# Patient Record
Sex: Female | Born: 1945
Health system: Southern US, Community
[De-identification: ages and names within clinical notes are randomized; demographics above are authoritative.]

## PROBLEM LIST (undated history)

## (undated) DIAGNOSIS — R112 Nausea with vomiting, unspecified: Secondary | ICD-10-CM

## (undated) DIAGNOSIS — Z860100 Personal history of colon polyps, unspecified: Secondary | ICD-10-CM

## (undated) DIAGNOSIS — N200 Calculus of kidney: Secondary | ICD-10-CM

## (undated) DIAGNOSIS — E559 Vitamin D deficiency, unspecified: Secondary | ICD-10-CM

## (undated) DIAGNOSIS — Z973 Presence of spectacles and contact lenses: Secondary | ICD-10-CM

## (undated) DIAGNOSIS — Z87442 Personal history of urinary calculi: Secondary | ICD-10-CM

## (undated) DIAGNOSIS — Z9889 Other specified postprocedural states: Secondary | ICD-10-CM

## (undated) DIAGNOSIS — K5730 Diverticulosis of large intestine without perforation or abscess without bleeding: Secondary | ICD-10-CM

## (undated) DIAGNOSIS — E785 Hyperlipidemia, unspecified: Secondary | ICD-10-CM

## (undated) DIAGNOSIS — N201 Calculus of ureter: Secondary | ICD-10-CM

## (undated) DIAGNOSIS — I499 Cardiac arrhythmia, unspecified: Secondary | ICD-10-CM

## (undated) DIAGNOSIS — R600 Localized edema: Secondary | ICD-10-CM

## (undated) DIAGNOSIS — I471 Supraventricular tachycardia, unspecified: Secondary | ICD-10-CM

## (undated) DIAGNOSIS — M479 Spondylosis, unspecified: Secondary | ICD-10-CM

## (undated) DIAGNOSIS — R3915 Urgency of urination: Secondary | ICD-10-CM

## (undated) DIAGNOSIS — I2699 Other pulmonary embolism without acute cor pulmonale: Secondary | ICD-10-CM

## (undated) DIAGNOSIS — I8390 Asymptomatic varicose veins of unspecified lower extremity: Secondary | ICD-10-CM

## (undated) DIAGNOSIS — I639 Cerebral infarction, unspecified: Secondary | ICD-10-CM

## (undated) DIAGNOSIS — M8580 Other specified disorders of bone density and structure, unspecified site: Secondary | ICD-10-CM

## (undated) DIAGNOSIS — M545 Low back pain, unspecified: Secondary | ICD-10-CM

## (undated) DIAGNOSIS — I878 Other specified disorders of veins: Secondary | ICD-10-CM

## (undated) DIAGNOSIS — T7840XA Allergy, unspecified, initial encounter: Secondary | ICD-10-CM

## (undated) DIAGNOSIS — M419 Scoliosis, unspecified: Secondary | ICD-10-CM

## (undated) DIAGNOSIS — R351 Nocturia: Secondary | ICD-10-CM

## (undated) DIAGNOSIS — Z85828 Personal history of other malignant neoplasm of skin: Secondary | ICD-10-CM

## (undated) DIAGNOSIS — C801 Malignant (primary) neoplasm, unspecified: Secondary | ICD-10-CM

## (undated) HISTORY — DX: Scoliosis, unspecified: M41.9

## (undated) HISTORY — DX: Other specified disorders of veins: I87.8

## (undated) HISTORY — PX: TUBAL LIGATION: SHX77

## (undated) HISTORY — DX: Calculus of kidney: N20.0

## (undated) HISTORY — DX: Nocturia: R35.1

## (undated) HISTORY — DX: Hyperlipidemia, unspecified: E78.5

## (undated) HISTORY — DX: Vitamin D deficiency, unspecified: E55.9

## (undated) HISTORY — PX: WISDOM TOOTH EXTRACTION: SHX21

## (undated) HISTORY — DX: Allergy, unspecified, initial encounter: T78.40XA

## (undated) HISTORY — DX: Asymptomatic varicose veins of unspecified lower extremity: I83.90

## (undated) HISTORY — DX: Cerebral infarction, unspecified: I63.9

## (undated) HISTORY — DX: Diverticulosis of large intestine without perforation or abscess without bleeding: K57.30

## (undated) HISTORY — DX: Other pulmonary embolism without acute cor pulmonale: I26.99

## (undated) HISTORY — DX: Malignant (primary) neoplasm, unspecified: C80.1

## (undated) HISTORY — PX: EXTRACORPOREAL SHOCK WAVE LITHOTRIPSY: SHX1557

## (undated) HISTORY — DX: Low back pain, unspecified: M54.50

## (undated) HISTORY — DX: Localized edema: R60.0

## (undated) HISTORY — DX: Other specified disorders of bone density and structure, unspecified site: M85.80

---
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
## 1966-08-25 HISTORY — PX: OTHER SURGICAL HISTORY: SHX169

## 1999-04-22 ENCOUNTER — Other Ambulatory Visit: Admission: RE | Admit: 1999-04-22 | Discharge: 1999-04-22 | Payer: Self-pay | Admitting: Obstetrics and Gynecology

## 1999-11-14 ENCOUNTER — Encounter: Admission: RE | Admit: 1999-11-14 | Discharge: 1999-11-14 | Payer: Self-pay | Admitting: Obstetrics and Gynecology

## 1999-11-14 ENCOUNTER — Encounter: Payer: Self-pay | Admitting: Obstetrics and Gynecology

## 2000-07-02 ENCOUNTER — Other Ambulatory Visit: Admission: RE | Admit: 2000-07-02 | Discharge: 2000-07-02 | Payer: Self-pay | Admitting: Obstetrics and Gynecology

## 2000-08-25 HISTORY — PX: COLONOSCOPY W/ POLYPECTOMY: SHX1380

## 2000-10-26 ENCOUNTER — Encounter (INDEPENDENT_AMBULATORY_CARE_PROVIDER_SITE_OTHER): Payer: Self-pay

## 2000-10-26 ENCOUNTER — Ambulatory Visit (HOSPITAL_COMMUNITY): Admission: RE | Admit: 2000-10-26 | Discharge: 2000-10-26 | Payer: Self-pay | Admitting: Internal Medicine

## 2000-11-24 ENCOUNTER — Encounter: Payer: Self-pay | Admitting: Obstetrics and Gynecology

## 2000-11-24 ENCOUNTER — Encounter: Admission: RE | Admit: 2000-11-24 | Discharge: 2000-11-24 | Payer: Self-pay | Admitting: Obstetrics and Gynecology

## 2000-12-22 ENCOUNTER — Encounter (INDEPENDENT_AMBULATORY_CARE_PROVIDER_SITE_OTHER): Payer: Self-pay | Admitting: Specialist

## 2000-12-22 ENCOUNTER — Ambulatory Visit (HOSPITAL_COMMUNITY): Admission: RE | Admit: 2000-12-22 | Discharge: 2000-12-22 | Payer: Self-pay | Admitting: Obstetrics and Gynecology

## 2000-12-22 HISTORY — PX: HYSTEROSCOPY WITH D & C: SHX1775

## 2001-07-29 ENCOUNTER — Ambulatory Visit (HOSPITAL_COMMUNITY): Admission: RE | Admit: 2001-07-29 | Discharge: 2001-07-29 | Payer: Self-pay | Admitting: Urology

## 2001-07-29 ENCOUNTER — Encounter: Payer: Self-pay | Admitting: Urology

## 2001-08-12 ENCOUNTER — Ambulatory Visit (HOSPITAL_BASED_OUTPATIENT_CLINIC_OR_DEPARTMENT_OTHER): Admission: RE | Admit: 2001-08-12 | Discharge: 2001-08-12 | Payer: Self-pay | Admitting: Urology

## 2001-08-12 ENCOUNTER — Encounter: Payer: Self-pay | Admitting: Urology

## 2001-08-13 ENCOUNTER — Other Ambulatory Visit: Admission: RE | Admit: 2001-08-13 | Discharge: 2001-08-13 | Payer: Self-pay | Admitting: Obstetrics and Gynecology

## 2001-12-14 ENCOUNTER — Encounter: Payer: Self-pay | Admitting: Obstetrics and Gynecology

## 2001-12-14 ENCOUNTER — Encounter: Admission: RE | Admit: 2001-12-14 | Discharge: 2001-12-14 | Payer: Self-pay | Admitting: Obstetrics and Gynecology

## 2002-07-07 ENCOUNTER — Other Ambulatory Visit: Admission: RE | Admit: 2002-07-07 | Discharge: 2002-07-07 | Payer: Self-pay | Admitting: Obstetrics and Gynecology

## 2002-12-20 ENCOUNTER — Encounter: Payer: Self-pay | Admitting: Obstetrics and Gynecology

## 2002-12-20 ENCOUNTER — Encounter: Admission: RE | Admit: 2002-12-20 | Discharge: 2002-12-20 | Payer: Self-pay | Admitting: Obstetrics and Gynecology

## 2003-07-14 ENCOUNTER — Other Ambulatory Visit: Admission: RE | Admit: 2003-07-14 | Discharge: 2003-07-14 | Payer: Self-pay | Admitting: Obstetrics and Gynecology

## 2003-08-26 HISTORY — PX: FACELIFT W/BLEPHAROPLASTY: SHX1568

## 2004-02-20 ENCOUNTER — Encounter: Admission: RE | Admit: 2004-02-20 | Discharge: 2004-02-20 | Payer: Self-pay | Admitting: Obstetrics and Gynecology

## 2004-07-26 ENCOUNTER — Other Ambulatory Visit: Admission: RE | Admit: 2004-07-26 | Discharge: 2004-07-26 | Payer: Self-pay | Admitting: Obstetrics and Gynecology

## 2004-08-21 ENCOUNTER — Ambulatory Visit: Payer: Self-pay | Admitting: Internal Medicine

## 2004-08-22 ENCOUNTER — Ambulatory Visit: Payer: Self-pay | Admitting: Internal Medicine

## 2005-05-12 ENCOUNTER — Encounter: Admission: RE | Admit: 2005-05-12 | Discharge: 2005-05-12 | Payer: Self-pay | Admitting: Obstetrics and Gynecology

## 2005-07-31 ENCOUNTER — Other Ambulatory Visit: Admission: RE | Admit: 2005-07-31 | Discharge: 2005-07-31 | Payer: Self-pay | Admitting: Obstetrics and Gynecology

## 2005-08-25 DIAGNOSIS — K5730 Diverticulosis of large intestine without perforation or abscess without bleeding: Secondary | ICD-10-CM

## 2005-08-25 HISTORY — DX: Diverticulosis of large intestine without perforation or abscess without bleeding: K57.30

## 2005-09-30 ENCOUNTER — Ambulatory Visit: Payer: Self-pay | Admitting: Internal Medicine

## 2005-12-15 ENCOUNTER — Ambulatory Visit: Payer: Self-pay | Admitting: Internal Medicine

## 2005-12-25 ENCOUNTER — Ambulatory Visit: Payer: Self-pay | Admitting: Internal Medicine

## 2006-05-30 ENCOUNTER — Inpatient Hospital Stay (HOSPITAL_COMMUNITY): Admission: RE | Admit: 2006-05-30 | Discharge: 2006-06-02 | Payer: Self-pay | Admitting: Orthopedic Surgery

## 2006-05-30 HISTORY — PX: TOTAL KNEE ARTHROPLASTY: SHX125

## 2006-07-13 ENCOUNTER — Ambulatory Visit (HOSPITAL_COMMUNITY): Admission: RE | Admit: 2006-07-13 | Discharge: 2006-07-13 | Payer: Self-pay | Admitting: Orthopedic Surgery

## 2006-07-13 HISTORY — PX: OTHER SURGICAL HISTORY: SHX169

## 2006-09-29 ENCOUNTER — Ambulatory Visit: Payer: Self-pay | Admitting: Internal Medicine

## 2006-09-29 LAB — CONVERTED CEMR LAB
ALT: 18 units/L (ref 0–40)
AST: 23 units/L (ref 0–37)
Albumin: 4 g/dL (ref 3.5–5.2)
Alkaline Phosphatase: 61 units/L (ref 39–117)
BUN: 15 mg/dL (ref 6–23)
Basophils Absolute: 0 10*3/uL (ref 0.0–0.1)
Basophils Relative: 0.5 % (ref 0.0–1.0)
Bilirubin, Direct: 0.1 mg/dL (ref 0.0–0.3)
CO2: 28 meq/L (ref 19–32)
Calcium: 9.3 mg/dL (ref 8.4–10.5)
Chloride: 107 meq/L (ref 96–112)
Cholesterol: 219 mg/dL (ref 0–200)
Creatinine, Ser: 0.8 mg/dL (ref 0.4–1.2)
Direct LDL: 136.8 mg/dL
Eosinophils Absolute: 0.1 10*3/uL (ref 0.0–0.6)
Eosinophils Relative: 1.9 % (ref 0.0–5.0)
GFR calc Af Amer: 93 mL/min
GFR calc non Af Amer: 77 mL/min
Glucose, Bld: 90 mg/dL (ref 70–99)
HCT: 39.7 % (ref 36.0–46.0)
HDL: 60.6 mg/dL (ref >39.0)
Hemoglobin: 14.2 g/dL (ref 12.0–15.0)
Lymphocytes Relative: 34.5 % (ref 12.0–46.0)
MCHC: 35.8 g/dL (ref 30.0–36.0)
MCV: 90.2 fL (ref 78.0–100.0)
Monocytes Absolute: 0.5 10*3/uL (ref 0.2–0.7)
Monocytes Relative: 8 % (ref 3.0–11.0)
Neutro Abs: 3.7 10*3/uL (ref 1.4–7.7)
Neutrophils Relative %: 55.1 % (ref 43.0–77.0)
Platelets: 242 10*3/uL (ref 150–400)
Potassium: 4 meq/L (ref 3.5–5.1)
RBC: 4.4 M/uL (ref 3.87–5.11)
RDW: 13 % (ref 11.5–14.6)
Sodium: 141 meq/L (ref 135–145)
TSH: 1.42 microintl units/mL (ref 0.35–5.50)
Total Bilirubin: 0.6 mg/dL (ref 0.3–1.2)
Total CHOL/HDL Ratio: 3.6
Total Protein: 7 g/dL (ref 6.0–8.3)
Triglycerides: 110 mg/dL (ref 0–149)
VLDL: 22 mg/dL (ref 0–40)
WBC: 6.6 10*3/uL (ref 4.5–10.5)

## 2006-11-27 ENCOUNTER — Other Ambulatory Visit: Admission: RE | Admit: 2006-11-27 | Discharge: 2006-11-27 | Payer: Self-pay | Admitting: Obstetrics and Gynecology

## 2007-01-14 ENCOUNTER — Ambulatory Visit: Payer: Self-pay | Admitting: Internal Medicine

## 2007-01-14 LAB — CONVERTED CEMR LAB
Cholesterol: 199 mg/dL (ref 0–200)
HDL: 60.5 mg/dL (ref >39.0)
LDL Cholesterol: 121 mg/dL — ABNORMAL HIGH (ref 0–99)
Total CHOL/HDL Ratio: 3.3
Triglycerides: 90 mg/dL (ref 0–149)
VLDL: 18 mg/dL (ref 0–40)

## 2007-01-19 DIAGNOSIS — Z860100 Personal history of colon polyps, unspecified: Secondary | ICD-10-CM | POA: Insufficient documentation

## 2007-01-19 DIAGNOSIS — T7840XA Allergy, unspecified, initial encounter: Secondary | ICD-10-CM | POA: Insufficient documentation

## 2007-01-19 DIAGNOSIS — Z85828 Personal history of other malignant neoplasm of skin: Secondary | ICD-10-CM | POA: Insufficient documentation

## 2007-01-19 DIAGNOSIS — IMO0002 Reserved for concepts with insufficient information to code with codable children: Secondary | ICD-10-CM

## 2007-06-23 ENCOUNTER — Encounter: Payer: Self-pay | Admitting: Internal Medicine

## 2007-06-23 ENCOUNTER — Encounter: Admission: RE | Admit: 2007-06-23 | Discharge: 2007-06-23 | Payer: Self-pay | Admitting: Obstetrics and Gynecology

## 2007-09-08 ENCOUNTER — Ambulatory Visit: Payer: Self-pay | Admitting: Internal Medicine

## 2007-09-08 DIAGNOSIS — M6530 Trigger finger, unspecified finger: Secondary | ICD-10-CM | POA: Insufficient documentation

## 2007-09-08 DIAGNOSIS — N390 Urinary tract infection, site not specified: Secondary | ICD-10-CM

## 2007-09-08 LAB — CONVERTED CEMR LAB
Bilirubin Urine: NEGATIVE
Glucose, Urine, Semiquant: NEGATIVE
Ketones, urine, test strip: NEGATIVE
Nitrite: NEGATIVE
Protein, U semiquant: NEGATIVE
Specific Gravity, Urine: 1.015
Urobilinogen, UA: NEGATIVE
pH: 6.5

## 2007-09-14 ENCOUNTER — Encounter (INDEPENDENT_AMBULATORY_CARE_PROVIDER_SITE_OTHER): Payer: Self-pay | Admitting: *Deleted

## 2007-11-24 ENCOUNTER — Ambulatory Visit: Payer: Self-pay | Admitting: Internal Medicine

## 2007-11-24 DIAGNOSIS — J309 Allergic rhinitis, unspecified: Secondary | ICD-10-CM | POA: Insufficient documentation

## 2007-11-24 DIAGNOSIS — C4490 Unspecified malignant neoplasm of skin, unspecified: Secondary | ICD-10-CM

## 2007-11-24 DIAGNOSIS — R002 Palpitations: Secondary | ICD-10-CM

## 2007-11-24 DIAGNOSIS — E785 Hyperlipidemia, unspecified: Secondary | ICD-10-CM

## 2007-11-24 HISTORY — DX: Palpitations: R00.2

## 2007-11-26 ENCOUNTER — Ambulatory Visit: Payer: Self-pay | Admitting: Internal Medicine

## 2007-12-01 ENCOUNTER — Encounter: Payer: Self-pay | Admitting: Internal Medicine

## 2007-12-05 LAB — CONVERTED CEMR LAB
ALT: 23 units/L (ref 0–35)
AST: 25 units/L (ref 0–37)
Albumin: 3.9 g/dL (ref 3.5–5.2)
Alkaline Phosphatase: 63 units/L (ref 39–117)
BUN: 20 mg/dL (ref 6–23)
Basophils Absolute: 0.1 10*3/uL (ref 0.0–0.1)
Basophils Relative: 1 % (ref 0.0–1.0)
Bilirubin, Direct: 0.1 mg/dL (ref 0.0–0.3)
CO2: 30 meq/L (ref 19–32)
Calcium: 9.4 mg/dL (ref 8.4–10.5)
Chloride: 111 meq/L (ref 96–112)
Cholesterol: 221 mg/dL (ref 0–200)
Creatinine, Ser: 0.8 mg/dL (ref 0.4–1.2)
Direct LDL: 159.5 mg/dL
Eosinophils Absolute: 0.2 10*3/uL (ref 0.0–0.7)
Eosinophils Relative: 3.8 % (ref 0.0–5.0)
GFR calc Af Amer: 93 mL/min
GFR calc non Af Amer: 77 mL/min
Glucose, Bld: 98 mg/dL (ref 70–99)
HCT: 43.7 % (ref 36.0–46.0)
HDL: 57.7 mg/dL (ref >39.0)
Hemoglobin: 14.2 g/dL (ref 12.0–15.0)
Lymphocytes Relative: 33.7 % (ref 12.0–46.0)
MCHC: 32.5 g/dL (ref 30.0–36.0)
MCV: 96.3 fL (ref 78.0–100.0)
Monocytes Absolute: 0.7 10*3/uL (ref 0.1–1.0)
Monocytes Relative: 10.5 % (ref 3.0–12.0)
Neutro Abs: 3.1 10*3/uL (ref 1.4–7.7)
Neutrophils Relative %: 51 % (ref 43.0–77.0)
Platelets: 219 10*3/uL (ref 150–400)
Potassium: 4.6 meq/L (ref 3.5–5.1)
RBC: 4.54 M/uL (ref 3.87–5.11)
RDW: 12.6 % (ref 11.5–14.6)
Sodium: 144 meq/L (ref 135–145)
TSH: 1.75 microintl units/mL (ref 0.35–5.50)
Total Bilirubin: 1 mg/dL (ref 0.3–1.2)
Total CHOL/HDL Ratio: 3.8
Total Protein: 6.5 g/dL (ref 6.0–8.3)
Triglycerides: 87 mg/dL (ref 0–149)
VLDL: 17 mg/dL (ref 0–40)
WBC: 6.2 10*3/uL (ref 4.5–10.5)

## 2007-12-06 ENCOUNTER — Encounter (INDEPENDENT_AMBULATORY_CARE_PROVIDER_SITE_OTHER): Payer: Self-pay | Admitting: *Deleted

## 2007-12-07 ENCOUNTER — Telehealth: Payer: Self-pay | Admitting: Internal Medicine

## 2007-12-15 ENCOUNTER — Encounter: Payer: Self-pay | Admitting: Internal Medicine

## 2007-12-15 ENCOUNTER — Other Ambulatory Visit: Admission: RE | Admit: 2007-12-15 | Discharge: 2007-12-15 | Payer: Self-pay | Admitting: Obstetrics and Gynecology

## 2007-12-17 ENCOUNTER — Encounter: Admission: RE | Admit: 2007-12-17 | Discharge: 2007-12-17 | Payer: Self-pay | Admitting: Obstetrics and Gynecology

## 2008-02-11 ENCOUNTER — Ambulatory Visit: Payer: Self-pay | Admitting: Internal Medicine

## 2008-02-20 LAB — CONVERTED CEMR LAB
ALT: 21 units/L (ref 0–35)
AST: 25 units/L (ref 0–37)
Cholesterol: 180 mg/dL (ref 0–200)
HDL: 53.1 mg/dL (ref >39.0)
LDL Cholesterol: 110 mg/dL — ABNORMAL HIGH (ref 0–99)
Total CHOL/HDL Ratio: 3.4
Triglycerides: 86 mg/dL (ref 0–149)
VLDL: 17 mg/dL (ref 0–40)

## 2008-02-23 ENCOUNTER — Encounter (INDEPENDENT_AMBULATORY_CARE_PROVIDER_SITE_OTHER): Payer: Self-pay | Admitting: *Deleted

## 2008-03-06 ENCOUNTER — Telehealth (INDEPENDENT_AMBULATORY_CARE_PROVIDER_SITE_OTHER): Payer: Self-pay | Admitting: *Deleted

## 2008-06-21 ENCOUNTER — Ambulatory Visit: Payer: Self-pay | Admitting: Internal Medicine

## 2008-07-03 ENCOUNTER — Encounter: Admission: RE | Admit: 2008-07-03 | Discharge: 2008-07-03 | Payer: Self-pay | Admitting: Internal Medicine

## 2008-08-21 ENCOUNTER — Ambulatory Visit (HOSPITAL_COMMUNITY): Admission: RE | Admit: 2008-08-21 | Discharge: 2008-08-21 | Payer: Self-pay | Admitting: Urology

## 2008-08-21 HISTORY — PX: OTHER SURGICAL HISTORY: SHX169

## 2008-08-23 ENCOUNTER — Telehealth (INDEPENDENT_AMBULATORY_CARE_PROVIDER_SITE_OTHER): Payer: Self-pay | Admitting: *Deleted

## 2008-08-23 ENCOUNTER — Ambulatory Visit: Payer: Self-pay | Admitting: Internal Medicine

## 2008-08-23 LAB — CONVERTED CEMR LAB
Cholesterol: 150 mg/dL (ref 0–200)
HDL: 60.2 mg/dL (ref >39.0)
LDL Cholesterol: 65 mg/dL (ref 0–99)
Total CHOL/HDL Ratio: 2.5
Triglycerides: 123 mg/dL (ref 0–149)
VLDL: 25 mg/dL (ref 0–40)

## 2008-08-24 ENCOUNTER — Encounter (INDEPENDENT_AMBULATORY_CARE_PROVIDER_SITE_OTHER): Payer: Self-pay | Admitting: *Deleted

## 2008-11-28 ENCOUNTER — Ambulatory Visit: Payer: Self-pay | Admitting: Internal Medicine

## 2008-11-28 DIAGNOSIS — Z87442 Personal history of urinary calculi: Secondary | ICD-10-CM | POA: Insufficient documentation

## 2008-11-28 DIAGNOSIS — E559 Vitamin D deficiency, unspecified: Secondary | ICD-10-CM

## 2008-11-28 LAB — CONVERTED CEMR LAB
ALT: 18 units/L (ref 0–35)
AST: 22 units/L (ref 0–37)
Albumin: 4.2 g/dL (ref 3.5–5.2)
Alkaline Phosphatase: 62 units/L (ref 39–117)
BUN: 18 mg/dL (ref 6–23)
Basophils Absolute: 0 10*3/uL (ref 0.0–0.1)
Basophils Relative: 0.2 % (ref 0.0–3.0)
Bilirubin, Direct: 0.1 mg/dL (ref 0.0–0.3)
CO2: 27 meq/L (ref 19–32)
Calcium: 9.4 mg/dL (ref 8.4–10.5)
Chloride: 109 meq/L (ref 96–112)
Cholesterol: 159 mg/dL (ref 0–200)
Creatinine, Ser: 0.8 mg/dL (ref 0.4–1.2)
Eosinophils Absolute: 0.2 10*3/uL (ref 0.0–0.7)
Eosinophils Relative: 2.6 % (ref 0.0–5.0)
GFR calc non Af Amer: 76.64 mL/min (ref >60)
Glucose, Bld: 89 mg/dL (ref 70–99)
HCT: 42.3 % (ref 36.0–46.0)
HDL: 56.1 mg/dL (ref >39.00)
Hemoglobin: 14.1 g/dL (ref 12.0–15.0)
LDL Cholesterol: 84 mg/dL (ref 0–99)
Lymphocytes Relative: 36.5 % (ref 12.0–46.0)
Lymphs Abs: 2.2 10*3/uL (ref 0.7–4.0)
MCHC: 33.2 g/dL (ref 30.0–36.0)
MCV: 96.8 fL (ref 78.0–100.0)
Monocytes Absolute: 0.6 10*3/uL (ref 0.1–1.0)
Monocytes Relative: 9.3 % (ref 3.0–12.0)
Neutro Abs: 3 10*3/uL (ref 1.4–7.7)
Neutrophils Relative %: 51.4 % (ref 43.0–77.0)
Platelets: 176 10*3/uL (ref 150.0–400.0)
Potassium: 4.6 meq/L (ref 3.5–5.1)
RBC: 4.37 M/uL (ref 3.87–5.11)
RDW: 13.1 % (ref 11.5–14.6)
Sodium: 143 meq/L (ref 135–145)
TSH: 1.09 microintl units/mL (ref 0.35–5.50)
Total Bilirubin: 1.1 mg/dL (ref 0.3–1.2)
Total CHOL/HDL Ratio: 3
Total Protein: 6.6 g/dL (ref 6.0–8.3)
Triglycerides: 94 mg/dL (ref 0.0–149.0)
VLDL: 18.8 mg/dL (ref 0.0–40.0)
WBC: 6 10*3/uL (ref 4.5–10.5)

## 2008-11-30 ENCOUNTER — Encounter (INDEPENDENT_AMBULATORY_CARE_PROVIDER_SITE_OTHER): Payer: Self-pay | Admitting: *Deleted

## 2008-12-01 ENCOUNTER — Encounter (INDEPENDENT_AMBULATORY_CARE_PROVIDER_SITE_OTHER): Payer: Self-pay | Admitting: *Deleted

## 2008-12-01 LAB — CONVERTED CEMR LAB: Vit D, 25-Hydroxy: 32 ng/mL (ref 30–89)

## 2008-12-19 ENCOUNTER — Other Ambulatory Visit: Admission: RE | Admit: 2008-12-19 | Discharge: 2008-12-19 | Payer: Self-pay | Admitting: Obstetrics and Gynecology

## 2009-06-14 ENCOUNTER — Encounter: Payer: Self-pay | Admitting: Internal Medicine

## 2009-06-22 ENCOUNTER — Ambulatory Visit: Payer: Self-pay | Admitting: Internal Medicine

## 2009-07-11 ENCOUNTER — Telehealth (INDEPENDENT_AMBULATORY_CARE_PROVIDER_SITE_OTHER): Payer: Self-pay | Admitting: *Deleted

## 2009-07-24 ENCOUNTER — Telehealth (INDEPENDENT_AMBULATORY_CARE_PROVIDER_SITE_OTHER): Payer: Self-pay | Admitting: *Deleted

## 2009-07-25 ENCOUNTER — Telehealth (INDEPENDENT_AMBULATORY_CARE_PROVIDER_SITE_OTHER): Payer: Self-pay | Admitting: *Deleted

## 2009-07-26 ENCOUNTER — Encounter: Admission: RE | Admit: 2009-07-26 | Discharge: 2009-07-26 | Payer: Self-pay | Admitting: Internal Medicine

## 2009-10-30 ENCOUNTER — Ambulatory Visit: Payer: Self-pay | Admitting: Internal Medicine

## 2009-11-09 ENCOUNTER — Ambulatory Visit: Payer: Self-pay | Admitting: Internal Medicine

## 2009-11-12 ENCOUNTER — Ambulatory Visit: Payer: Self-pay | Admitting: Internal Medicine

## 2009-11-14 ENCOUNTER — Encounter: Payer: Self-pay | Admitting: Internal Medicine

## 2009-11-29 ENCOUNTER — Ambulatory Visit: Payer: Self-pay | Admitting: Internal Medicine

## 2009-12-04 ENCOUNTER — Ambulatory Visit: Payer: Self-pay | Admitting: Internal Medicine

## 2009-12-10 LAB — CONVERTED CEMR LAB
ALT: 18 units/L (ref 0–35)
AST: 23 units/L (ref 0–37)
Albumin: 3.9 g/dL (ref 3.5–5.2)
Alkaline Phosphatase: 63 units/L (ref 39–117)
BUN: 21 mg/dL (ref 6–23)
Basophils Absolute: 0 10*3/uL (ref 0.0–0.1)
Basophils Relative: 0.8 % (ref 0.0–3.0)
Bilirubin Urine: NEGATIVE
Bilirubin, Direct: 0.1 mg/dL (ref 0.0–0.3)
CO2: 28 meq/L (ref 19–32)
Calcium: 9.2 mg/dL (ref 8.4–10.5)
Chloride: 110 meq/L (ref 96–112)
Cholesterol: 172 mg/dL (ref 0–200)
Creatinine, Ser: 0.8 mg/dL (ref 0.4–1.2)
Eosinophils Absolute: 0.2 10*3/uL (ref 0.0–0.7)
Eosinophils Relative: 3.9 % (ref 0.0–5.0)
GFR calc non Af Amer: 76.4 mL/min (ref >60)
Glucose, Bld: 92 mg/dL (ref 70–99)
HCT: 41.6 % (ref 36.0–46.0)
HDL: 68.5 mg/dL (ref >39.00)
Hemoglobin, Urine: NEGATIVE
Hemoglobin: 14.3 g/dL (ref 12.0–15.0)
Ketones, ur: NEGATIVE mg/dL
LDL Cholesterol: 88 mg/dL (ref 0–99)
Leukocytes, UA: NEGATIVE
Lymphocytes Relative: 41 % (ref 12.0–46.0)
Lymphs Abs: 2.3 10*3/uL (ref 0.7–4.0)
MCHC: 34.3 g/dL (ref 30.0–36.0)
MCV: 94.8 fL (ref 78.0–100.0)
Monocytes Absolute: 0.6 10*3/uL (ref 0.1–1.0)
Monocytes Relative: 11 % (ref 3.0–12.0)
Neutro Abs: 2.4 10*3/uL (ref 1.4–7.7)
Neutrophils Relative %: 43.3 % (ref 43.0–77.0)
Nitrite: NEGATIVE
Platelets: 214 10*3/uL (ref 150.0–400.0)
Potassium: 4 meq/L (ref 3.5–5.1)
RBC: 4.39 M/uL (ref 3.87–5.11)
RDW: 14.2 % (ref 11.5–14.6)
Sodium: 145 meq/L (ref 135–145)
Specific Gravity, Urine: >=1.03 (ref 1.000–1.030)
TSH: 1.95 microintl units/mL (ref 0.35–5.50)
Total Bilirubin: 0.5 mg/dL (ref 0.3–1.2)
Total CHOL/HDL Ratio: 3
Total Protein, Urine: NEGATIVE mg/dL
Total Protein: 6.7 g/dL (ref 6.0–8.3)
Triglycerides: 78 mg/dL (ref 0.0–149.0)
Urine Glucose: NEGATIVE mg/dL
Urobilinogen, UA: 0.2 (ref 0.0–1.0)
VLDL: 15.6 mg/dL (ref 0.0–40.0)
WBC: 5.5 10*3/uL (ref 4.5–10.5)
pH: 5.5 (ref 5.0–8.0)

## 2009-12-25 ENCOUNTER — Ambulatory Visit: Payer: Self-pay | Admitting: Internal Medicine

## 2009-12-25 DIAGNOSIS — M8708 Idiopathic aseptic necrosis of bone, other site: Secondary | ICD-10-CM | POA: Insufficient documentation

## 2009-12-25 DIAGNOSIS — K5730 Diverticulosis of large intestine without perforation or abscess without bleeding: Secondary | ICD-10-CM

## 2009-12-31 ENCOUNTER — Encounter: Payer: Self-pay | Admitting: Internal Medicine

## 2010-01-04 ENCOUNTER — Telehealth (INDEPENDENT_AMBULATORY_CARE_PROVIDER_SITE_OTHER): Payer: Self-pay | Admitting: *Deleted

## 2010-01-10 ENCOUNTER — Ambulatory Visit: Payer: Self-pay | Admitting: Internal Medicine

## 2010-01-10 DIAGNOSIS — M545 Low back pain: Secondary | ICD-10-CM | POA: Insufficient documentation

## 2010-02-11 ENCOUNTER — Encounter: Payer: Self-pay | Admitting: Internal Medicine

## 2010-02-11 ENCOUNTER — Telehealth (INDEPENDENT_AMBULATORY_CARE_PROVIDER_SITE_OTHER): Payer: Self-pay | Admitting: *Deleted

## 2010-02-11 DIAGNOSIS — J45991 Cough variant asthma: Secondary | ICD-10-CM

## 2010-03-08 ENCOUNTER — Ambulatory Visit: Payer: Self-pay | Admitting: Internal Medicine

## 2010-03-11 ENCOUNTER — Telehealth (INDEPENDENT_AMBULATORY_CARE_PROVIDER_SITE_OTHER): Payer: Self-pay | Admitting: *Deleted

## 2010-03-11 ENCOUNTER — Encounter: Payer: Self-pay | Admitting: Internal Medicine

## 2010-09-11 LAB — BASIC METABOLIC PANEL
BUN: 20 mg/dL (ref 6–23)
CO2: 25 mEq/L (ref 19–32)
Calcium: 9.8 mg/dL (ref 8.4–10.5)
Chloride: 109 mEq/L (ref 96–112)
Creatinine, Ser: 0.93 mg/dL (ref 0.4–1.2)
GFR calc Af Amer: >60 mL/min (ref >60)
GFR calc non Af Amer: >60 mL/min (ref >60)
Glucose, Bld: 96 mg/dL (ref 70–99)
Potassium: 4.3 mEq/L (ref 3.5–5.1)
Sodium: 142 mEq/L (ref 135–145)

## 2010-09-12 ENCOUNTER — Ambulatory Visit
Admission: RE | Admit: 2010-09-12 | Discharge: 2010-09-12 | Payer: Self-pay | Source: Home / Self Care | Attending: Orthopedic Surgery | Admitting: Orthopedic Surgery

## 2010-09-12 HISTORY — PX: CARPECTOMY WITH RADIAL STYLOIDECTOMY: SHX5609

## 2010-09-16 LAB — POCT HEMOGLOBIN-HEMACUE: Hemoglobin: 14.5 g/dL (ref 12.0–15.0)

## 2010-09-20 NOTE — Op Note (Signed)
Sarah Valdez, Sarah Valdez                ACCOUNT NO.:  [id number]  MEDICAL RECORD NO.:  [id number]          PATIENT TYPE:  AMB  LOCATION:  DSC                          FACILITY:  [facility]  PHYSICIAN:  Katy Fitch. Diangelo Radel, M.D. DATE OF BIRTH:  [birth year]  DATE OF PROCEDURE:  09/12/2010 DATE OF DISCHARGE:                              OPERATIVE REPORT   PREOPERATIVE DIAGNOSIS:  Stage IIIA Kienbock disease of right wrist, status post 1 year of unsuccessful nonoperative management.  POSTOPERATIVE DIAGNOSIS:  Stage IIIA Kienbock disease of right wrist, status post 1 year of unsuccessful nonoperative management with demonstration of greater than 95% saponification of lunate marrow and 90% of load-bearing proximal articular surface reveaing a cartilage peel crescent sign.  OPERATIONS: 1. Right proximal row carpectomy with lunate marrow and articular     surface biopsy. 2. Right radial styloidectomy sparing radiocarpal ligament origin and     first dorsal compartment. 3. Segmental posterior interosseous neurectomy.  OPERATING SURGEON:  Katy Fitch. Kalei Mckillop, MD  ASSISTANT:  Marveen Reeks Dasnoit PA-C  ANESTHESIA:  General by LMA supplemented by a right infraclavicular block.  SUPERVISING ANESTHESIOLOGIST:  Janetta Hora. 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  In the winter of 2011, she began to experience significant right wrist pain without loss of range of motion.  She had pain during her golf back- swaying, could not do a push-up and could not load bear on her right wrist comfortably.  She presented for an upper extremity orthopedic consult on September 17, 2009, and was noted to have central wrist pain with x-rays revealing a ulnar neutral variant, some cystic change in the lunate, and clinical exam consistent with possible ulnocarpal abutment, internal derangement versus early Kienbock  disease.  Given her gender and age, she would not be considered a typical Kienbocks candidate.  We recommended a diagnostic MRI.  She had other health care issues to tend to and was traveling between [place], [place] and [place], [place].  We advised immobilization of the wrist until her diagnosis was clarified.  She return from [place] in March 2011 and on November 03, 2009, underwent a right wrist MRI.  This demonstrated abnormal T1 and T2 signal in the entire right lunate without evidence of significant collapse.  Her radioscaphoid angle on the lateral x-ray varied between 55 and 60 degrees.  We advised that she had a stage IIIA Kienbock disease with visible flattening of lunate without fragmentation.  She had not developed any sign of carpal collapse.  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  We could not clearly identify a fracture, although on the index MRI, there was the suggestion of some disruption of the marrow.  We discussed treatment options.  At her age and given the slight flattening lunate noted on plain films, I recommended splinting  and a period of observation, avoiding load-bearing on the wrist and continued clinical followup.  We discussed the spectrum treatment options and again given the long history of satisfactory results with proximal row carpectomy, I primarily suggested that this be considered as a salvage procedure if her wrist pain became intolerable.  She lived an active lifestyle through the fall and early winter of 2011 and returned for followup evaluation with increasing pain on August 28, 2010.  She was noted to have lost approximately 30 degrees of combined dorsiflexion-palmar flexion and on  plain film exam, I felt there was evidence of further flattening of the lunate.  Therefore, I requested a followup MRI to determine if she was beginning fragmentation or had signs of revascularization.  The second MRI was obtained on August 28, 2010, and revealed no sign of fracture and minimal normal marrow signal in the lunate.  There was a very small area on the volar aspect of lunate adjacent to the ligament insertion that had some normal T2 signal and the remainder of the bone approximately 95% of volume of lunate had abnormal signal.  Given the combined factors of her age, steroid exposure, athletic pursuits and desired to have limited surgical experience, I recommended proceeding with a palliative proximal row carpectomy which the literature would suggest with give her excellent grip strength, functional motion and more than 10 years of satisfactory function.  She had many questions after studying this predicament online.  We discussed the full spectrum treatment options from capitate shortening to the intercarpal fusion to revascularization with vascular pedicle versus vascularized bone graft.  Based on a very extensive review of the current ligature, in my judgment, the proximal row carpectomy has been the most durable and simple solution to this complex predicament.  After period of deliberation, Mr. Riggsbee elected to proceed with proximal row carpectomy at this time.  I pointed out the technical details of radial styloidectomy and neurectomy in an effort to further augment pain relief.  Questions were invited and answered in detail.  PROCEDURE:  Sarah Valdez was met at the [facility] in the holding area.  Dr. Gelene Mink had provided detailed anesthesia, informed consent and recommended proceeding with an infraclavicular block for perioperative comfort.  This was placed with ultrasound technique without complication. Excellent anesthesia of the right upper  extremity was achieved.  A detailed informed consent was repeated with Mr. and Sarah Valdez in the holding area and questions were invited and answered in detail.  She was brought to room #1 of the [facility] and placed in supine position on the operating table.  Under Dr. Thornton Dales direct supervision, general anesthesia by LMA technique was induced.  Vancomycin was provided as an IV prophylactic antibiotic due to prior history of Staph infection with prior orthopedic surgery and her allergies to penicillin and Levaquin.  The right arm was then prepped with Betadine soap solution and sterilely draped.  A pneumatic tourniquet was applied to the proximal right brachium.  Following exsanguination of the right arm with an Esmarch bandage, the arterial tourniquet in the proximal brachium was inflated to 220 mmHg.  A routine surgical time-out was accomplished followed by a 4-cm incision paralleling the radial border of the fourth dorsal compartment.  Subcutaneous tissues were carefully divided meticulously identifying the dorsal vessels.  These were electrocauterized allowing exposure of the retinaculum.  The dorsal cutaneous sensory branches were preserved.  The extensor retinaculum was split on the radial aspect of the fourth dorsal compartment proximal  to Lister's tubercle.  The extensor tendon of the fourth dorsal compartment retracted in a ulnar direction followed by resection of a 2-cm segment of the posterior interosseous nerve taking care to separate the posterior interosseous vessel.  A careful capsulotomy was then accomplished leaving a rim of ligament and periosteal tissue more than 2 mm wide across the dorsum of the radius allowing exposure of the proximal carpal row.  The wrist was placed under distraction and the radiocarpal articulation carefully studied.  There was some osteophyte formation of the dorsal lip of the lunate facet.  The scapholunate ligament was  intact and the proximal pole of the scaphoid was normal.  There was an osteophyte forming in the dorsal aspect of the lunate probably from impingement and dorsiflexion and with careful palpation of the [place], it was clear that there was a crescent sign of detached hyaline cartilage covering 90% of the load-bearing surface of the lunate.  A midcarpal arthrotomy was accomplished confirming pristine hyaline cartilage on the proximal pole of the capitate.  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  After identification of the crescent sign, we proceeded directly to proximal row carpectomy by piecemeal excision of the lunate.  With a 95%, lunate had saponified marrow and was extremely soft.  There was a small area of vascularized marrow at the insertion of the dorsal ligaments and the most volar distal tip of the lunate.  The scaphoid was meticulously resected followed by resection of the triquetrum.  Synovectomy was accomplished followed by range of motion of the wrist.  There was radial styloid, trapezium contacted radial deviation beyond 15 degrees.  Therefore, I resected the distal 6-mm of the radial styloid in a manner that would allow increased radial deviation.  Care was taken to subperiosteally strip the first dorsal compartment and identify the radial artery prior to styloidectomy.  A rongeur was used to smooth the margins.  The pisotriquetral joint was inspected and found to be normal after removal of the triquetrum.  The triangular fibrocartilage complex was intact.  After irrigation, final inspection of the carpectomy revealed very satisfactory articular surfaces of the lunate facet and proximal capitate.  The proximal pole of the hamate was microfracture with an 18- gauge needle followed by imbrication of the dorsal capsule to properly tensioned the dorsal capsule following  proximal row resection.  Multiple figure-of-eight sutures of 3-0 Ethilon were used for dorsal closure followed by release of the tourniquet.  Hemostasis was achieved by direct pressure and bipolar cautery followed by repair of the extensor retinaculum with anatomic figure-of-eight sutures of 3-0 Ethibond proximally and 4-0 Vicryl distally.  The skin was repaired with subcutaneous 4-0 Vicryl and interdermal 3-0 Prolene with Steri-Strips.  There were no apparent complications.  Sarah Valdez tolerated the surgery and anesthesia well.  She was placed in a voluminous gauze and sterile Webril dressing with dorsal and palmar plaster sandwich splints.  She will be discharged home to the care of her husband with prescriptions for Dilaudid and Percocet 7.5 mg one p.o. q.4-6 h. p.r.n. pain 40 tablets without refill, also doxycycline 100 mg one p.o. q.12 h. x4 days as a prophylactic antibiotic.  We will see her back for follow up in 6 days for dressing change and advancement to a postoperative cast or splint.  She anticipates wrist immobilization for 6 weeks followed by a rehabilitation program.  Katy Fitch Zunairah Devers, M.D.     RVS/MEDQ  D:  09/12/2010  T:  09/13/2010  Job:  [id number]  cc:   Titus Dubin. Alwyn Ren, MD,FACP,FCCP  Electronically Signed by Josephine Igo M.D. on 09/18/2010 08:01:12 AM

## 2010-09-24 NOTE — Progress Notes (Signed)
Summary: cough no better  Phone Note Call from Patient Call back at Home Phone [phone]   Caller: Patient Summary of Call: pt c/o Ongoing cough with greenish mucous for several months.  Pt was seen on 11-09-09 for this. pt would like a rx sent in to the pharmacy. [facility]...............Marland KitchenFelecia Deloach CMA  Jan 04, 2010 1:24 PM   Follow-up for Phone Call        pt had CPE on 5/3 and this was not brought up.  Dr Alwyn Ren recommended regular use of singulair and symbicort at march visit- please make sure pt is using regularly.  also suggested pulmonary function testing if no improvement.  it's inappropriate to call in meds for something that hasn't been seen in 2 months.  needs appt or can go to UC.  try OTC mucinex, Delsym. Follow-up by: Neena Rhymes MD,  Jan 04, 2010 1:34 PM  Additional Follow-up for Phone Call Additional follow up Details #1::        Spoke with patient, patient will try OTC recommendtions. I offered patient appointment here or [place] location-refused. I also informed patient of [facility]. Patient  ok'd and said she will start with OTC recommendations first. Additional Follow-up by: Shonna Chock,  Jan 04, 2010 1:47 PM

## 2010-09-24 NOTE — Assessment & Plan Note (Signed)
Summary: bad cough//lch   Vital Signs:  Patient profile:   65 year old female Height:      67.5 inches Weight:      165.13 pounds BMI:     25.57 Temp:     98.5 degrees F oral Pulse rate:   63 / minute Resp:     16 per minute BP sitting:   100 / 60  Vitals Entered By: Kandice Hams (Jan 10, 2010 12:32 PM) CC: c/o cough off and on since last fall, cough is back with some production, clear to yellow   CC:  c/o cough off and on since last fall, cough is back with some production, and clear to yellow.  History of Present Illness: Recurrence of cough with chest tightness & light yellow sputum & "pearls". Symptoms are worse in late afternoon. Same picture as has recurred intermittently over past 9 months. Bronchodilators help(Symbicort & albuterol). Despite PMH of PSVT, no palpitations with these. No  change in cough compared to cough when on  Beta blocker Atenolol. No definite PMH of asthma ,but she has been treated for "allergies " repeatedly in past with steroids on multiple occasions.                                                            Also  she describes acute L LS pain after lifting grandchildren & carrying  their "supplies"  &  moving plants 01/05/2010.PMH of DDD in LS spine.PMH of  ESIs.PMH of "Code Blue" post op with Morphine.  Allergies: 1)  ! Penicillin 2)  ! Morphine 3)  ! Levaquin  Review of Systems General:  Denies chills, fever, and sweats. ENT:  Denies nasal congestion and sinus pressure; No frontal headache, facial pain or purulence. Resp:  Denies shortness of breath and wheezing. GU:  Denies discharge, dysuria, hematuria, and incontinence. MS:  Complains of low back pain; denies muscle weakness; LBP post lifting. Neuro:  Denies brief paralysis, numbness, tingling, and weakness. Allergy:  Denies itching eyes and sneezing.  Physical Exam  General:  well-nourished,in no acute distress; alert,appropriate and cooperative throughout examination Ears:  External ear  exam shows no significant lesions or deformities.  Otoscopic examination reveals clear canals, tympanic membranes are intact bilaterally without bulging, retraction, inflammation or discharge. Hearing is grossly normal bilaterally.  Nose:  External nasal examination shows no deformity or inflammation. Nasal mucosa are pink and moist without lesions or exudates.Septum to R Mouth:  Oral mucosa and oropharynx without lesions or exudates.  Teeth in good repair. Lungs:  Normal respiratory effort, chest expands symmetrically. Lungs are clear to auscultation, no crackles or wheezes. Extremities:  No clubbing, cyanosis, edema. Negative SLRbilaterally Neurologic:  strength normal in lower extremities and DTRs symmetrical and normal.   Skin:  Intact without suspicious lesions or rashes Cervical Nodes:  No lymphadenopathy noted Axillary Nodes:  No palpable lymphadenopathy   Impression & Recommendations:  Problem # 1:  COUGH (ICD-786.2) RAD component; pathophysiology of RAD discussed in detail. Peak Flow monitor Rxed with PFTs once stable for several weeks  Problem # 2:  LOW BACK PAIN, ACUTE (ICD-724.2) post lifting Her updated medication list for this problem includes:    Aleve 220 Mg Tabs (Naproxen sodium) .Marland Kitchen... 1 by mouth once daily    Meloxicam 7.5 Mg Tabs (  Meloxicam) .Marland Kitchen... 1 two times a day as needed back pain    Cyclobenzaprine Hcl 5 Mg Tabs (Cyclobenzaprine hcl) .Marland Kitchen... 1 two times a day & 1-2 at bedtime as needed  Complete Medication List: 1)  Flonase 50 Mcg/act Susp (Fluticasone propionate) .... Take once daily 2)  Claritin 10 Mg Tabs (Loratadine) .... Take once daily 3)  Pravastatin Sodium 40 Mg Tabs (Pravastatin sodium) .Marland Kitchen.. 1 qhs 4)  Vitamin D 57846 Unit Caps (Ergocalciferol) .... Take as directed 5)  Aleve 220 Mg Tabs (Naproxen sodium) .Marland Kitchen.. 1 by mouth once daily 6)  Diltiazem Hcl 120 Mg Tabs (Diltiazem hcl) .Marland Kitchen.. 1 two times a day 7)  Furosemide 20 Mg Tabs (Furosemide) .Marland Kitchen.. 1 once daily  as needed edema 8)  Meloxicam 7.5 Mg Tabs (Meloxicam) .Marland Kitchen.. 1 two times a day as needed back pain 9)  Cyclobenzaprine Hcl 5 Mg Tabs (Cyclobenzaprine hcl) .Marland Kitchen.. 1 two times a day & 1-2 at bedtime as needed 10)  Symbicort 160-4.5 Mcg/act Aero (Budesonide-formoterol fumarate) .Marland Kitchen.. 1-2 puffs two times a day ; gargle & spit after use 11)  Proair Hfa 108 (90 Base) Mcg/act Aers (Albuterol sulfate) .Marland Kitchen.. 1-2 puffs every 4 hrs as needed for cough, wheezing or sob 12)  Azithromycin 250 Mg Tabs (Azithromycin) .... As per pack  Patient Instructions: 1)  PFTs once well for 2-4 weeks.Monitor Peak Flow . Fill Rx for Zithromax if having signs of infection as discussed Prescriptions: AZITHROMYCIN 250 MG TABS (AZITHROMYCIN) as per pack  #1 x 0   Entered and Authorized by:   Marga Melnick MD   Signed by:   Marga Melnick MD on 01/10/2010   Method used:   Print then Give to Patient   RxID:   [id number] PROAIR HFA 108 (90 BASE) MCG/ACT AERS (ALBUTEROL SULFATE) 1-2 puffs every 4 hrs as needed for cough, wheezing or SOB  #1 x 2   Entered and Authorized by:   Marga Melnick MD   Signed by:   Marga Melnick MD on 01/10/2010   Method used:   Print then Give to Patient   RxID:   [id number] SYMBICORT 160-4.5 MCG/ACT AERO (BUDESONIDE-FORMOTEROL FUMARATE) 1-2 puffs two times a day ; gargle & spit after use  #1 x 5   Entered and Authorized by:   Marga Melnick MD   Signed by:   Marga Melnick MD on 01/10/2010   Method used:   Print then Give to Patient   RxID:   [id number] CYCLOBENZAPRINE HCL 5 MG TABS (CYCLOBENZAPRINE HCL) 1 two times a day & 1-2 at bedtime as needed  #20 x 0   Entered and Authorized by:   Marga Melnick MD   Signed by:   Marga Melnick MD on 01/10/2010   Method used:   Faxed to ...       [facility] (retail)       [address]       [place], [place]  [address]       Ph: [phone]       Fax: [phone]   RxID:   [id number] MELOXICAM  7.5 MG TABS (MELOXICAM) 1 two times a day as needed back pain  #14 x 0   Entered and Authorized by:   Marga Melnick MD   Signed by:   Marga Melnick MD on 01/10/2010   Method used:   Faxed to ...       [facility] [address] (retail)  [address]       [place], [place]  [address]       Ph: [phone]       Fax: [phone]   RxID:   [id number]

## 2010-09-24 NOTE — Progress Notes (Signed)
Summary: undated number for PFT result  Phone Note Call from Patient Call back at [phone]   Summary of Call: Pt had PFT done last friday. pt is would like to leave number she can be reach for follow-up because [place] work well there.................Marland KitchenFelecia Deloach CMA  March 11, 2010 1:35 PM   Follow-up for Phone Call        Please advise on PFT's to be discussed with patient Follow-up by: Shonna Chock CMA,  March 11, 2010 5:09 PM  Additional Follow-up for Phone Call Additional follow up Details #1::        Delightfully, but somewhat surprisingly ,PFTs are excellent. No residual or underlying obstructive (asthmatic)pattern seen. Copy will be mailed. Additional Follow-up by: Marga Melnick MD,  March 12, 2010 7:47 AM    Additional Follow-up for Phone Call Additional follow up Details #2::    Left message at [phone] with results, patient to call if any questions or concerns. Copy of report mailed Follow-up by: Shonna Chock CMA,  March 12, 2010 1:10 PM

## 2010-09-24 NOTE — Miscellaneous (Signed)
----   Converted from flag ---- ---- 12/31/2009 12:06 PM, Earlyne Iba wrote: Dr. Alwyn Ren,  Medicare does not cover routine physical only Medicare annual wellness visit is covered. Documentation requirements for the annual wellness is as follows:   1.          Assess risk factors based on Past M, S, F history:  2.   Assessment of limitations in Physical Activities:  3.   Assessment depression/mood:  4.   Hearing:  5.   ADL's/ functional capabilities:  6.   Cognitive impairment test:    Orientation of time and place:    Language test (reading, writing, Speech):    Memory:    Attention Test: 7.   Home Safety:  8.   Height, weight, &visual acuity: 9.   Counseling:  10.   BMI:  11.   Recommended list of preventive services:  12.   Labs ordered based risk factors  Please let me know if you have any questions.  Thanks Lateef ------------------------------

## 2010-09-24 NOTE — Assessment & Plan Note (Signed)
Summary: still has bad cough//lch   Vital Signs:  Patient profile:   65 year old female Weight:      163.6 pounds Temp:     98.4 degrees F oral Pulse rate:   64 / minute Resp:     15 per minute BP sitting:   118 / 72  (left arm) Cuff size:   large  Vitals Entered By: Shonna Chock (November 09, 2009 2:35 PM) CC: Ongoing cough Comments REVIEWED MED LIST, PATIENT AGREED DOSE AND INSTRUCTION CORRECT    CC:  Ongoing cough.  History of Present Illness: She continues to have  NP cough;"20% better" after   she  completed Prednisone & Clarithromycin . She thought she was to D/C Advair & Singulair when she started the oral steroids & antibiotic.She  took Advair & Singulair X 3 days only .She questions  having palpitations  as possible  trigger for  cough ,ie  cough as response to palpitations.She has been on Atenolol for control of palpitations & PSVT  after several agents were tried in remote past.No PMH of Beta blocker induced bronchospasm.ROS does not suggest infectious ,allergic or GI components to cough as no PNDrainage or ERD symptoms. Oral steroids "wired " her. Prior records reviewed; lytes & TSH were WNL in 11/2008.  Allergies: 1)  ! Penicillin 2)  ! Morphine 3)  ! Levaquin  Past History:  Past Medical History: SVT, PMH of , on Atenolol with control TRIGGER FINGER, LEFT THUMB (ICD-727.03) ,resolved post injection ALLERGY (ICD-995.3) DEGENERATIVE DISC DISEASE (ICD-722.6) COLONIC POLYPS, HX OF (ICD-V12.72), Dr Juanda Chance SKIN CANCER, HX OF (ICD-V10.83), basal cell CA ?3, Dr Mayford Knife Nephrolithiasis, hx of Vitamin D deficiency  Review of Systems General:  Denies chills, fever, and sweats. ENT:  Denies nasal congestion, postnasal drainage, and sinus pressure. CV:  Complains of palpitations. Resp:  Complains of cough; denies shortness of breath, sputum productive, and wheezing. GI:  Denies indigestion. Allergy:  Denies itching eyes and sneezing.  Physical Exam  General:   well-nourished; alert,appropriate and cooperative throughout examination Ears:  External ear exam shows no significant lesions or deformities.  Otoscopic examination reveals clear canals, tympanic membranes are intact bilaterally without bulging, retraction, inflammation or discharge. Hearing is grossly normal bilaterally. Nose:  External nasal examination shows no deformity or inflammation. Nasal mucosa are pink and moist without lesions or exudates. Mouth:  Oral mucosa and oropharynx without lesions or exudates.  Teeth in good repair. Lungs:  Normal respiratory effort, chest expands symmetrically. Lungs are clear to auscultation, no crackles or wheezes. Slight hacking dry cough Heart:  Normal rate and regular rhythm. S1 and S2 normal without gallop, murmur, click, rub.   Impression & Recommendations:  Problem # 1:  COUGH (ICD-786.2)  Orders: T-2 View CXR (71020TC)  Problem # 2:  PALPITATIONS (ICD-785.1) PSVT , PMH of  The following medications were removed from the medication list:    Atenolol 50 Mg Tabs (Atenolol) .Marland Kitchen... Take once daily; CCB two times a day was Rxed  Orders: EKG w/ Interpretation (93000)  Complete Medication List: 1)  Flonase 50 Mcg/act Susp (Fluticasone propionate) .... Take once daily 2)  Claritin 10 Mg Tabs (Loratadine) .... Take once daily 3)  Pravastatin Sodium 40 Mg Tabs (Pravastatin sodium) .Marland Kitchen.. 1 qhs 4)  Vitamin D 04540 Unit Caps (Ergocalciferol) .... Take as directed 5)  Aleve 220 Mg Tabs (Naproxen sodium) .Marland Kitchen.. 1 by mouth once daily 6)  Singulair 10 Mg Tabs (Montelukast sodium) .Marland Kitchen.. 1 once daily 7)  Diltiazem  Hcl 120 Mg Tabs (Diltiazem hcl) .Marland Kitchen.. 1 two times a day 8)  Symbicort 160-4.5 Mcg/act Aero (Budesonide-formoterol fumarate) .Marland Kitchen.. 1-2 puffs every 12 hours  as needed  Patient Instructions: 1)  PFTs  & possibly Holter Monitor(PMH of PSVT)  if cough & palpitations  persist with med changes. Stop Advair ; continue Singulair  once daily & statrt Symbicort.  Repeat BMET, Mg++  & TFTS if palpitations persist. Prescriptions: SYMBICORT 160-4.5 MCG/ACT AERO (BUDESONIDE-FORMOTEROL FUMARATE) 1-2 puffs every 12 hours  as needed  #1 x 0   Entered and Authorized by:   Marga Melnick MD   Signed by:   Marga Melnick MD on 11/10/2009   Method used:   Samples Given   RxID:   [id number] DILTIAZEM HCL 120 MG TABS (DILTIAZEM HCL) 1 two times a day  #60 x 5   Entered and Authorized by:   Marga Melnick MD   Signed by:   Marga Melnick MD on 11/09/2009   Method used:   F[facility]ed to ...       [facility]       [address]       [place], [place]  [address]       Ph: [phone]       Fax: [phone]   RxID:   [id number]

## 2010-09-24 NOTE — Miscellaneous (Signed)
Summary: Orders Update pft charges  Clinical Lists Changes  Orders: Added new Service order of Carbon Monoxide diffusing w/capacity (94720) - Signed Added new Service order of Lung Volumes (94240) - Signed Added new Service order of Spirometry (Pre & Post) (94060) - Signed 

## 2010-09-24 NOTE — Progress Notes (Signed)
Summary: Symbicort not helping  Phone Note Call from Patient Call back at Work Phone [phone] Call back at cell [phone]   Caller: Patient Summary of Call: Pt called and stated that they Symbicort is not helping her at all. She would like to know what type of further testing you were talking about doing. Because she does not feel that she is getting any better. Army Fossa CMA  February 11, 2010 9:21 AM   Follow-up for Phone Call        see Order for PFTs; take Symbicort 2 puffs every 12 hrs until PFTs Follow-up by: Marga Melnick MD,  February 11, 2010 4:44 PM  Additional Follow-up for Phone Call Additional follow up Details #1::        Spoke with patient, patient ok'd all information and was transferred to [facility] for futher info about PFT appointment Additional Follow-up by: Shonna Chock,  February 12, 2010 9:18 AM  New Problems: COUGH VARIANT ASTHMA (ICD-493.82)   New Problems: COUGH VARIANT ASTHMA (ICD-493.82) New/Updated Medications: SYMBICORT 160-4.5 MCG/ACT AERO (BUDESONIDE-FORMOTEROL FUMARATE) 2 puffs two times a day ; gargle & spit after use

## 2010-09-24 NOTE — Assessment & Plan Note (Signed)
Summary: bronchtsis/kdc   Vital Signs:  Patient profile:   65 year old female Temp:     98.4 degrees F oral Pulse rate:   60 / minute Resp:     17 per minute BP sitting:   104 / 76  (left arm) Cuff size:   large  Vitals Entered By: Shonna Chock (October 30, 2009 1:37 PM) CC: ? Bronchitis Comments REVIEWED MED LIST, PATIENT AGREED DOSE AND INSTRUCTION CORRECT  **Refused weight check**   CC:  ? Bronchitis.  History of Present Illness: Recurrent bronchitis since late last Fall; well X 8 weeks. Milder case in [place] in past 6  weeks;Avelox X 5 days  in [place] with 80% improvement. Husband ill @ same time. Non smoker ; no PMH of asthma.Flu shot in Fall; no PNA vaccine.  Allergies: 1)  ! Penicillin 2)  ! Morphine 3)  ! Levaquin  Review of Systems General:  Denies chills, fever, and sweats. ENT:  Denies nasal congestion and sinus pressure; No frontal headache, facial pain or purulence. Resp:  Complains of cough, shortness of breath, and sputum productive; denies chest pain with inspiration, coughing up blood, and wheezing; Yellow sputum. GI:  Denies indigestion. Allergy:  Denies itching eyes and sneezing.  Physical Exam  General:  well-nourished,in no acute distress;  cooperative throughout examination Ears:  External ear exam shows no significant lesions or deformities.  Otoscopic examination reveals clear canals, tympanic membranes are intact bilaterally without bulging, retraction, inflammation or discharge. Hearing is grossly normal bilaterally. Nose:  External nasal examination shows no deformity or inflammation. Nasal mucosa are pink and moist without lesions or exudates.R nare smaller Mouth:  Oral mucosa and oropharynx without lesions or exudates.  Teeth in good repair. Lungs:  Normal respiratory effort, chest expands symmetrically. Lungs are clear to auscultation, no crackles or wheezes. Brisk dry cough Cervical Nodes:  No lymphadenopathy noted Axillary Nodes:  No palpable  lymphadenopathy   Impression & Recommendations:  Problem # 1:  BRONCHITIS-ACUTE (ICD-466.0) residual RAD component The following medications were removed from the medication list:    Azithromycin 250 Mg Tabs (Azithromycin) .Marland Kitchen... As per pack    Benzonatate 100 Mg Caps (Benzonatate) .Marland Kitchen... 1  q 8 hrs as needed cough    Metronidazole 500 Mg Tabs (Metronidazole) .Marland Kitchen... 1 three times a day Her updated medication list for this problem includes:    Singulair 10 Mg Tabs (Montelukast sodium) .Marland Kitchen... 1 once daily    Advair Diskus 250-50 Mcg/dose Aepb (Fluticasone-salmeterol) .Marland Kitchen... 1 inhalation every 12 hrs ; gargle & spit after use    Clarithromycin 500 Mg Xr24h-tab (Clarithromycin) .Marland Kitchen... 2 once daily with a meal  Complete Medication List: 1)  Atenolol 50 Mg Tabs (Atenolol) .... Take once daily 2)  Flonase 50 Mcg/act Susp (Fluticasone propionate) .... Take once daily 3)  Claritin 10 Mg Tabs (Loratadine) .... Take once daily 4)  Pravastatin Sodium 40 Mg Tabs (Pravastatin sodium) .Marland Kitchen.. 1 qhs 5)  Vitamin D 16109 Unit Caps (Ergocalciferol) .... Take as directed 6)  Aleve 220 Mg Tabs (Naproxen sodium) .Marland Kitchen.. 1 by mouth once daily 7)  Singulair 10 Mg Tabs (Montelukast sodium) .Marland Kitchen.. 1 once daily 8)  Advair Diskus 250-50 Mcg/dose Aepb (Fluticasone-salmeterol) .Marland Kitchen.. 1 inhalation every 12 hrs ; gargle & spit after use 9)  Clarithromycin 500 Mg Xr24h-tab (Clarithromycin) .... 2 once daily with a meal 10)  Prednisone 20 Mg Tabs (Prednisone) .Marland Kitchen.. 1 two times a day with ameal  Patient Instructions: 1)  Drink as much  fluid as you can tolerate for the next few days.Fill Prednisone & Clarithromycin if no better in 36 hrs Prescriptions: PREDNISONE 20 MG TABS (PREDNISONE) 1 two times a day with ameal  #14 x 0   Entered and Authorized by:   Marga Melnick MD   Signed by:   Marga Melnick MD on 10/30/2009   Method used:   Print then Give to Patient   RxID:   [id number] CLARITHROMYCIN 500 MG XR24H-TAB  (CLARITHROMYCIN) 2 once daily with a meal  #14 x 0   Entered and Authorized by:   Marga Melnick MD   Signed by:   Marga Melnick MD on 10/30/2009   Method used:   Print then Give to Patient   RxID:   [id number] ADVAIR DISKUS 250-50 MCG/DOSE AEPB (FLUTICASONE-SALMETEROL) 1 inhalation every 12 hrs ; gargle & spit after use  #1 x 0   Entered and Authorized by:   Marga Melnick MD   Signed by:   Marga Melnick MD on 10/30/2009   Method used:   Samples Given   RxID:   [id number] SINGULAIR 10 MG TABS (MONTELUKAST SODIUM) 1 once daily  #14 x 0   Entered and Authorized by:   Marga Melnick MD   Signed by:   Marga Melnick MD on 10/30/2009   Method used:   Samples Given   RxID:   [id number]

## 2010-09-24 NOTE — Assessment & Plan Note (Signed)
Summary: CPX,MEDICARE & BCBS/RH.....   Vital Signs:  Patient profile:   65 year old female Weight:      163 pounds Temp:     98.7 degrees F oral Pulse rate:   80 / minute Resp:     18 per minute BP sitting:   118 / 78  (left arm)  Vitals Entered By: Jeremy Johann CMA (Dec 25, 2009 3:55 PM) CC: yearly,refills Comments REVIEWED MED LIST, PATIENT AGREED DOSE AND INSTRUCTION CORRECT    CC:  yearly and refills.  History of Present Illness: Sarah Valdez is here for a physical; her history & Dr Stark Jock note were  reviewed. Apparently she HAS had intermittent steroid dosepacks & steroid injections for seaonal allergies  from Dr Corinda Gubler & [facility] [place]. Allergy shots were associated with severe reactions necessitating additional steroid imjections. Preventive healthcare reviewed ; all are up to  date except POA & Living Will.  Allergies: 1)  ! Penicillin 2)  ! Morphine 3)  ! Levaquin  Past History:  Past Medical History: SVT, PMH of , on CCB with control TRIGGER FINGER, LEFT THUMB (ICD-727.03) ,resolved post steroid  injection DEGENERATIVE DISC DISEASE (ICD-722.6), S/P ESI as 2 separate series of 2 or 3 injections COLONIC POLYPS, HX OF (ICD-V12.72), Dr Juanda Chance SKIN CANCER, HX OF (ICD-V10.83), basal cell  cancer ? 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 Eyes:  Denies blurring, double vision, and vision loss-both eyes. ENT:  Denies nasal congestion, postnasal drainage, and sinus pressure; No frontal headache , facial pain or purulence. CV:  Complains of swelling of feet; denies chest pain or discomfort, difficulty breathing at night, difficulty breathing while lying down, leg cramps with exertion, palpitations, and swelling of hands; LLE > RLE X 1 week. Resp:  Denies cough, shortness of breath, sputum productive, and wheezing. GI:  Denies abdominal pain, bloody stools, dark tarry stools, and indigestion. GU:  Denies discharge, dysuria, and hematuria; Appt with Dr Genice Rouge  12/26/2009. MS:  Complains of low back pain; denies mid back pain, muscle aches, cramps, and thoracic pain. Derm:  Denies changes in nail beds, dryness, hair loss, lesion(s), and rash. Neuro:  Denies numbness and tingling. Psych:  Denies anxiety and depression. Endo:  Denies cold intolerance, excessive hunger, excessive thirst, excessive urination, and heat intolerance. Heme:  Denies abnormal bruising and bleeding. Allergy:  Complains of seasonal allergies; denies hives or rash, itching eyes, and sneezing; No angioedema.  Physical Exam  General:  well-nourished; alert,appropriate and cooperative throughout examination Head:  Normocephalic and atraumatic without obvious abnormalities.  Eyes:  No corneal or conjunctival inflammation noted. Perrla. Funduscopic exam benign, without hemorrhages, exudates or papilledema.  Ears:  External ear exam shows no significant lesions or deformities.  Otoscopic examination reveals clear canals,  tympanic membranes are intact bilaterally without bulging, retraction, inflammation or discharge. Hearing is grossly normal bilaterally. Nose:  External nasal examination shows no  deformity or inflammation. Nasal mucosa are pink and moist without lesions or exudates. Mouth:  Oral mucosa and oropharynx without lesions or exudates.  Teeth in good repair. Neck:  No deformities, masses, or tenderness noted. Lungs:  Normal respiratory effort, chest expands symmetrically. Lungs are clear to auscultation, no crackles or wheezes. Heart:  Normal rate and regular rhythm. S1 and S2 normal without gallop, murmur, click, rub.S4 Abdomen:  Bowel sounds positive,abdomen soft and non-tender without masses, organomegaly or hernias noted. Genitalia:  Dr Genice Rouge Msk:  No deformity or scoliosis noted of thoracic or lumbar spine.   Pulses:  R and L carotid,radial,dorsalis pedis and posterior tibial pulses are full and equal bilaterally Extremities:  No clubbing, cyanosis. trace left pedal edema and trace right pedal edema.  Decreased ROM R knee Neurologic:  alert & oriented X3 and DTRs symmetrical and normal.   Skin:  Intact without suspicious lesions or rashes Cervical Nodes:  No lymphadenopathy noted Axillary Nodes:  No palpable lymphadenopathy Psych:  memory intact for recent and remote, normally interactive, and good eye contact.     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 1 qhs  Problem # 4:  SKIN CANCER, HX OF (ICD-V10.83) as per Dr Mayford Knife  Problem # 5:  COLONIC POLYPS, HX OF (ICD-V12.72) as per Dr Juanda Chance  Problem # 6:  VITAMIN D DEFICIENCY (ICD-268.9)  Problem # 7:  PALPITATIONS (ICD-785.1) controlled on CCB; PMH of PSVT  Complete Medication List: 1)  Flonase 50 Mcg/act Susp (Fluticasone propionate) .... Take once daily 2)  Claritin 10 Mg Tabs  (Loratadine) .... Take once daily 3)  Pravastatin Sodium 40 Mg Tabs (Pravastatin sodium) .Marland Kitchen.. 1 qhs 4)  Vitamin D 16109 Unit Caps (Ergocalciferol) .... Take as directed 5)  Aleve 220 Mg Tabs (Naproxen sodium) .Marland Kitchen.. 1 by mouth once daily 6)  Diltiazem Hcl 120 Mg Tabs (Diltiazem hcl) .Marland Kitchen.. 1 two times a day 7)  Furosemide 20 Mg Tabs (Furosemide) .Marland Kitchen.. 1 once daily as needed edema  Patient Instructions: 1)  Share corrected record with all MDs seen.Ask Dr Genice Rouge to check vitamin D level 12/26/2009. 2)  Limit your Sodium (Salt) to less than 4 grams a day (slightly less than 1 teaspoon) to prevent fluid retention, swelling, or worsening or symptoms. 3)  Choose your Health Care Power of Attorney and/or prepare a Living Will. Prescriptions: FUROSEMIDE 20 MG TABS (FUROSEMIDE) 1 once daily as needed edema  #30 x 5   Entered and Authorized by:   Marga Melnick MD   Signed by:   Marga Melnick MD on 12/25/2009   Method used:   Print then Give to Patient   RxID:   [id number] FLONASE 50 MCG/ACT  SUSP (FLUTICASONE PROPIONATE) TAKE once daily  #1 x 11   Entered and Authorized by:   Marga Melnick MD   Signed by:   Marga Melnick MD on 12/25/2009   Method used:   Print then Give to Patient   RxID:   [id number]

## 2010-09-27 NOTE — Letter (Signed)
Summary: [facility]   Imported By: Lanelle Bal 11/24/2009 10:32:09  _____________________________________________________________________  External Attachment:    Type:   Image     Comment:   External Document  Appended Document: [facility] [place] I have reviewed Sarah Valdez's history ; she has not  used oral & inhaled steroids for years . She did have ? bronchospasm with Beta blockers but requires Atenolol to prevent PSVT.

## 2010-09-27 NOTE — Letter (Signed)
Summary: [facility]    Imported By: Lanelle Bal 02/26/2010 08:30:35  _____________________________________________________________________  External Attachment:    Type:   Image     Comment:   External Document

## 2010-10-15 ENCOUNTER — Other Ambulatory Visit: Payer: Self-pay | Admitting: Internal Medicine

## 2010-10-15 DIAGNOSIS — Z1231 Encounter for screening mammogram for malignant neoplasm of breast: Secondary | ICD-10-CM

## 2010-10-29 ENCOUNTER — Ambulatory Visit
Admission: RE | Admit: 2010-10-29 | Discharge: 2010-10-29 | Disposition: A | Discharge status: Home / Self Care | Payer: Medicare Other | Source: Ambulatory Visit | Attending: Internal Medicine | Admitting: Internal Medicine

## 2010-10-29 DIAGNOSIS — Z1231 Encounter for screening mammogram for malignant neoplasm of breast: Secondary | ICD-10-CM

## 2010-12-13 ENCOUNTER — Other Ambulatory Visit: Payer: Self-pay | Admitting: *Deleted

## 2010-12-13 MED ORDER — PRAVASTATIN SODIUM 40 MG PO TABS: 40.0000 mg | ORAL_TABLET | Freq: Every day | ORAL | Status: DC

## 2010-12-18 ENCOUNTER — Telehealth: Payer: Self-pay | Admitting: Internal Medicine

## 2010-12-18 NOTE — Telephone Encounter (Signed)
I called the pharmacy and spoke with Joy and she indicated rx was received, in bin and ready for pick-up.  I called patient and left message informing her med was responded to on 12/13/2010 and is ready for pick-up

## 2010-12-18 NOTE — Telephone Encounter (Signed)
Patient says she has checked with [facility], Jani Files and she says they are waiting to hear from [place] about her 90 day prescription for Pravastatin---please call it in and then call patient at [phone] to tell her when it was called in----patient is out of medication!!!!

## 2010-12-26 ENCOUNTER — Encounter: Payer: Self-pay | Admitting: Internal Medicine

## 2010-12-27 ENCOUNTER — Encounter: Payer: Self-pay | Admitting: Internal Medicine

## 2010-12-27 ENCOUNTER — Ambulatory Visit (INDEPENDENT_AMBULATORY_CARE_PROVIDER_SITE_OTHER): Payer: Medicare Other | Admitting: Internal Medicine

## 2010-12-27 VITALS — BP 110/68 | HR 72 | Temp 98.2°F | Resp 14 | Ht 67.75 in | Wt 161.4 lb

## 2010-12-27 DIAGNOSIS — Z0000 Encounter for general adult medical examination without abnormal findings: Secondary | ICD-10-CM

## 2010-12-27 DIAGNOSIS — Z23 Encounter for immunization: Secondary | ICD-10-CM

## 2010-12-27 DIAGNOSIS — Z0100 Encounter for examination of eyes and vision without abnormal findings: Secondary | ICD-10-CM

## 2010-12-27 DIAGNOSIS — E559 Vitamin D deficiency, unspecified: Secondary | ICD-10-CM

## 2010-12-27 DIAGNOSIS — Z86010 Personal history of colonic polyps: Secondary | ICD-10-CM

## 2010-12-27 DIAGNOSIS — E785 Hyperlipidemia, unspecified: Secondary | ICD-10-CM

## 2010-12-27 LAB — BASIC METABOLIC PANEL
BUN: 27 mg/dL — ABNORMAL HIGH (ref 6–23)
Calcium: 9.5 mg/dL (ref 8.4–10.5)
Creatinine, Ser: 0.9 mg/dL (ref 0.4–1.2)
GFR: 64 mL/min (ref >60.00)

## 2010-12-27 LAB — LIPID PANEL
Cholesterol: 199 mg/dL (ref 0–200)
LDL Cholesterol: 104 mg/dL — ABNORMAL HIGH (ref 0–99)
Triglycerides: 96 mg/dL (ref 0.0–149.0)

## 2010-12-27 LAB — CBC WITH DIFFERENTIAL/PLATELET
Eosinophils Relative: 2.8 % (ref 0.0–5.0)
Lymphocytes Relative: 33.3 % (ref 12.0–46.0)
MCV: 96.1 fl (ref 78.0–100.0)
Monocytes Absolute: 0.6 10*3/uL (ref 0.1–1.0)
Monocytes Relative: 9.1 % (ref 3.0–12.0)
Neutrophils Relative %: 54.2 % (ref 43.0–77.0)
Platelets: 205 10*3/uL (ref 150.0–400.0)
WBC: 6.2 10*3/uL (ref 4.5–10.5)

## 2010-12-27 MED ORDER — PNEUMOCOCCAL VAC POLYVALENT 25 MCG/0.5ML IJ INJ
0.5000 mL | INJECTION | Freq: Once | INTRAMUSCULAR | Status: AC
  Administered 2010-12-27: 0.5 mL via INTRAMUSCULAR

## 2010-12-27 NOTE — Assessment & Plan Note (Signed)
NMR Lipoprofile 2007: LDL 122 (1483/ 804), HDL 69, TG 115. LDL goal = < 110. Mother MI @ 16. MGF MI @ 8

## 2010-12-27 NOTE — Progress Notes (Signed)
Subjective:    Patient ID: Sarah Valdez, female    DOB: [birth year], 65 y.o.   MRN: [id number]  HPI Medicare Wellness Visit:  The following psychosocial & medical history were reviewed as required by Medicare.   Social history: caffeine: 1 cup coffee & 1 tea , alcohol:  Averages 1/ day ,  tobacco use : never  & exercise : aerobics, walking,strength 3X/ week 30-60 min.   Home & personal  safety / fall risk: no issues, activities of daily living: no limitations , seatbelt use : yes , and smoke alarm employment : yes .  Power of AttorneyLliving Will status : yes   Vision ( as recorded per Nurse) & Hearing  evaluation :  Whisper heard @ 6 ft. Orientation :oriented X3  , memory & recall :normal, spelling testing:  normal,and mood & affect : normal . Depression / anxiety:  No issues Travel history : 2010 [place] , immunization status :Pneumovax needed , transfusion history:  no, and preventive health surveillance ( colonoscopies, BMD , etc as per protocol/ [facility]): Colonoscopy due 2017, Dental care:  Every 6 months . Chart reviewed &  Updated. Active issues reviewed & addressed.       Review of Systems  Patient reports no vision/ hearing  changes, adenopathy,fever, weight change,  persistant / recurrent hoarseness , swallowing issues, chest pain,palpitations,persistant /recurrent cough, hemoptysis, dyspnea( rest/ exertional/paroxysmal nocturnal), gastrointestinal bleeding(melena, rectal bleeding), abdominal pain, significant heartburn,  bowel changes (chronic constipation),GU symptoms(dysuria, hematuria,pyuria, incontinence), Gyn symptoms(abnormal  bleeding , pain),  syncope, focal weakness, memory loss,numbness & tingling, skin/hair /nail changes,abnormal bruising or bleeding. One episode of cough in Spring; no significant RAD symptoms. Edema is intermittent; RLE > LLE    Objective:   Physical Exam Gen.: Healthy and well-nourished in appearance. Alert, appropriate and cooperative throughout exam. Head:  Normocephalic without obvious abnormalities Eyes: No corneal or conjunctival inflammation noted. Pupils equal round reactive to light and accommodation. Fundal exam is benign without hemorrhages, exudate, papilledema. Extraocular motion intact. Ears: External  ear exam reveals no significant lesions or deformities. Canals clear .TMs normal.  Nose: External nasal exam reveals no deformity or inflammation. Nasal mucosa are pink and moist. No lesions or exudates noted. Septum w/o deviation  Mouth: Oral mucosa and oropharynx reveal no lesions or exudates. Teeth in good repair. Neck: No deformities, masses, or tenderness noted. Range of motion  normal. Thyroid normal. Lungs: Normal respiratory effort; chest expands symmetrically. Lungs are clear to auscultation without rales, wheezes, or increased work of breathing. Heart: Slow rate and rhythm. Normal S1 and S2. No gallop, click, or rub.  No murmur. Abdomen: Bowel sounds normal; abdomen soft and nontender. No masses, organomegaly or hernias noted. Genitalia: Dr Genice Rouge.                                                                                     Musculoskeletal/extremities: No deformity or scoliosis noted of  the thoracic or lumbar spine. No clubbing, cyanosis, edema, or deformity noted. Range of motion  Normal except @ R knee .Tone & strength  normal. Nail health  good. Vascular: Carotid, radial artery, dorsalis pedis and dorsalis posterior tibial pulses are  full and equal. No bruits present. Neurologic: Alert and oriented x3. Deep tendon reflexes symmetrical and normal.         Skin: Intact without suspicious lesions or rashes. Lymph: No cervical, axillary, or inguinal lymphadenopathy present. Psych: Mood and affect are normal. Normally interactive                                                                                         Assessment & Plan:  #1 Medicare wellness Visit; no acute issues  ; criteria addressed & data entered                                                                                                                                                          #2 Edema , intermittent due to mild venous insufficiency                                                                                                                                       #3 Vit D deficiency                                                                                                                                                                                                  #  4 colon polyps , PMH of                                                                                                                                                                                         #5 dyslipidemia                                                                                                                                                                                                       Plan: see Orders & Recommendations

## 2010-12-27 NOTE — Patient Instructions (Addendum)
Preventive Health Care: Exercise  30-45  minutes a day, 3-4 days a week. Walking is especially valuable in preventing Osteoporosis. Eat a low-fat diet with lots of fruits and vegetables, up to 7-9 servings per day. Avoid obesity; your goal = waist less than 35 inches.Consume less than 30 grams of sugar per day from foods & drinks with High Fructose Corn Syrup as #2,3 or #4 on label. Eye Doctor - have an eye exam @ least annually BMD every 25 months. Verify need for Colonoscopy survelliance of adenomatous polyps.

## 2011-01-07 NOTE — Op Note (Signed)
Sarah Valdez, Sarah Valdez NO.:  [id number]   MEDICAL RECORD NO.:  [id number]          PATIENT TYPE:  AMB   LOCATION:  DAY                          FACILITY:  [facility]   PHYSICIAN:  Excell Seltzer. Annabell Howells, M.D.    DATE OF BIRTH:  August 16, 1946   DATE OF PROCEDURE:  08/21/2008  DATE OF DISCHARGE:                               OPERATIVE REPORT   PROCEDURE:  Cystoscopy, right retrograde pyelogram with interpretation,  insertion of right double-J stent.   PREOPERATIVE DIAGNOSIS:  Right renal pelvic stone.   POSTOPERATIVE DIAGNOSIS:  Right renal pelvic stone.   SURGEON:  Excell Seltzer. Annabell Howells, M.D.   ANESTHESIA:  General.   FINDINGS:  Stone in the right UPJ with no hydro and clear urine in the  kidney.   SPECIMEN:  None.   DRAINS:  A 6-French 26 cm double-J stent.   COMPLICATIONS:  None.   INDICATIONS:  [name] is a 65 year old white female with an 11 mm  right UPJ stone who is to undergo stenting and lithotripsy today.  Her  urine last week did have some pyuria and bacteruria in addition to  hematuria and she was started on Levaquin 500 mg daily.  She has had no  fever and minimal flank pain.   FINDINGS AND PROCEDURE:  The patient had been on Levaquin and was given  Cipro 400 mg IV.  She was taken to the operating room where general  anesthetic was induced.  Cystoscopy was performed using a 22-French  scope and 12 degrees lens.  Examination revealed a normal urethra.  The  bladder wall had mild trabeculation.  The mucosa was slightly  erythematous with some mild changes consistent with follicular cystitis.  Ureteral orifices were unremarkable.   The right ureteral orifice was cannulated with a 5-French open-end  catheter and contrast was instilled.   The right retrograde pyelogram revealed a normal ureter.  The stone was  noted at the UPJ with no significant hydronephrosis   The 5-French open-end catheter was advanced to the kidney and urine was  aspirated.  It was crystal  clear without evidence of pyonephrosis.  A  guidewire was then passed through the open-end catheter to the kidney.  The open-end catheter was removed and a 6-French 26 cm double-J stent  with string was passed without difficulty to the kidney under  fluoroscopic guidance.  The wire was removed leaving a good coil in the  kidney and a good coil in the  bladder.  The bladder was drained.  The cystoscope was removed.  The  stent string was left exiting the urethra.  It was tied close to the  meatus and trimmed.  The patient was taken down from the lithotomy  position.  Her anesthetic was reversed.  She was admitted to the  recovery room in stable condition.  There were no complications.      Excell Seltzer. Annabell Howells, M.D.  Electronically Signed     JJW/MEDQ  D:  08/21/2008  T:  08/21/2008  Job:  [id number]   cc:   Maretta Bees. Vonita Moss, M.D.  Fax: [phone]

## 2011-01-10 NOTE — Op Note (Signed)
[name], [name] NO.:  [id number]   MEDICAL RECORD NO.:  [id number]          PATIENT TYPE:  AMB   LOCATION:  DAY                          FACILITY:  [facility]   PHYSICIAN:  Ollen Gross, M.D.    DATE OF BIRTH:  1945/11/18   DATE OF PROCEDURE:  07/13/2006  DATE OF DISCHARGE:                                 OPERATIVE REPORT   PREOPERATIVE DIAGNOSIS:  Arthrofibrosis, right knee.   POSTOPERATIVE DIAGNOSIS:  Arthrofibrosis, right knee.   PROCEDURE:  Closed manipulation, right knee.   SURGEON:  Ollen Gross, M.D.   ASSISTANT:  No assistant.   ANESTHESIA:  General.   FINDINGS:  Pre-manipulation range of motion 10-80; post-manipulation range  of motion 0-125.   COMPLICATIONS:  None.   CONDITION:  Stable, to Recovery.   BRIEF CLINICAL NOTE:  Sarah Valdez is a 65 year old female who had a right total  knee arthroplasty done approximately 6 weeks ago.  She has had problems  regaining motion in physical therapy and has plateaued to about flexion of  85 degrees.  She presents now for closed manipulation.   PROCEDURE IN DETAIL:  After the successful administration of a general  anesthetic, I measured her range of motion pre-manipulation and it was 10-  80.  With gentle force applied to her proximal tibia with my chest, I flexed  the knee and the adhesions audibly lysed.  I was able to get her flexed  easily to 125 degrees.  We were also able to achieve full extension and  improve patellar mobility.  She was subsequently awakened and transported to  Recovery in stable condition.      Ollen Gross, M.D.  Electronically Signed     FA/MEDQ  D:  07/13/2006  T:  07/14/2006  Job:  [id number]

## 2011-01-10 NOTE — Letter (Signed)
May 25, 2006     Ollen Gross, M.D.  [facility]  [address]  [address]  [place], [place] [address]   RE:  Sarah, Valdez  MRN:  [id number]  /  DOB:  [birth year]   Dear Dr. Lequita Halt:   Your office has requested surgical clearance on Sarah Valdez.   She had a complete physical examination September 30, 2005.  She was  essentially asymptomatic except for her musculoskeletal complaints.   Her past history includes appendectomy, tonsillectomy, tubal ligation, renal  calculus requiring surgery followed by lithotripsy on at least 2 occasions.  Colonoscopy has revealed polyps.  In 1968 she had knee surgery complicated  by staph infection.  She has had skin malignancies.  In 2003 she had  supraventricular tachycardia.   FAMILY HISTORY:  Is positive for throat cancer, myocardial infarction,  hypertension, emphysema and breast cancer.   She has never smoked.  She drinks socially.   PENICILLIN and MORPHINE both caused urticaria.   When last seen, she was on Aleve as needed.  Prometrium, Femring, atenolol  50 mg to prevent superficial tachycardia and supplements.   VITAL SIGNS:  Were normal.  She did have slight ptosis of the right eye which was related to previous  facial surgery.  The thyroid was slightly irregular but there were no nodules noted.  She had  mild arteriolar narrowing on fundal exam.  Dorsalis pedis muscles were slightly decreased.  She had marked degenerative  changes in the right knee.   EKG revealed sinus bradycardia.   CBC and differential, CMET and TSH were normal with the exception of a total  bilirubin of 1.3 compatible with Gilbert's syndrome.   Based on her LipoProfile, carbohydrate restriction was recommended with  repeat cholesterol levels in 6 months.  She was due for this in August and  it has not been completed.  Would you be kind enough to have fasting lipids  done while she is hospitalized.   Unless symptoms or signs have developed  in the interim, I see no  contraindication of her having the surgery.  Evaluation perioperatively by  anesthesia should suffice.  Should medical problems arise, [facility] can be consulted.    Sincerely,      Titus Dubin. Alwyn Ren, MD,FACP,FCCP     WFH/MedQ  DD:  05/25/2006  DT:  05/25/2006  Job #:  [id number]

## 2011-01-10 NOTE — H&P (Signed)
[facility]  Patient:    Sarah Valdez, Sarah Valdez                         MRN: [id number] Dictator:   Esmeralda Arthur, M.D.                         History and Physical  HISTORY OF PRESENT ILLNESS:   This is a 65 year old female, para 2-0-2, who is admitted to the hospital for a hysterotomy and D&C because of a thickened endometrium and fluid mainly in the endometrium.  She has an ultrasound which showed that she had a fluid-filled endometrium with a thickness of 13 mm.  She had had a previous ultrasound and we had hoped she would have a period but she did not have one.  She was brought to the office and we attempted to do aspiration of the uterus with a Pipelle and multiple chances trying to dilate her cervix but we were unable to.  Therefore, she is admitted to the hospital for a dilatation under anesthesia and a hysteroscopy.  Her last menstrual period was in October 2001; she had no bleeding since then.  Her hormones were decreased slightly and she has had no periods.  She is taking Ortho-Est 0.625 and she takes Provera 10 mg 1 through 12.  She takes Rolaids for calcium and takes Surveyor, quantity. She also takes Claritin p.r.n.  Her caffeine is average.  She socially drinks and she does not smoke.  She has no incontinence.  She has had no surgery.  REVIEW OF SYSTEMS:            She has had some weight loss, she wanted to. She has had no problems of the eyes, ears, nose or throat.  Cardiovascular: She has some breathing problems with the time of the year.  She had some irritable bowel syndrome and she has some arthritis which bothers her at times.  PAST PERSONAL HISTORY:        She has had pneumonia in the past, treated, no sequelae.  She has had kidney infections in the past and she has had kidney stones that have been treated.  She has had heart trouble in the past.  She has glaucoma.  FAMILY HISTORY:               Her mother has heart disease and  mother has high blood pressure, and her father has been treated for colon cancer.  PREVIOUS SURGERY:             She has had a T&A, appendectomy, right knee surgery, bilateral tubal ligation, laparoscopy, and surgery on her kidney stones.  PHYSICAL EXAMINATION:  GENERAL:                      Reveals a well-developed, well-nourished female in no acute distress.  VITAL SIGNS:                  Blood pressure is 94/71, weight is 161, her height is 5 feet 8 inches.  NECK:                         Thyroid is not palpable.  LUNGS:                        Clear to percussion and auscultation.  HEART:  Normal sinus rhythm without gallops or murmur.  ABDOMEN:                      The liver is not palpated.  GENITALIA:                    External genitalia within normal limits.  Vagina has estrogen effect.  She has a cystocele -2.  Her cervix is epithelialized and stenotic, I cannot find an opening.  The uterus feels normal size.  The ovaries are not palpable.  The perineum is within normal limits.  RECTAL:                       Negative, she has hemorrhoids.  IMPRESSION:                   1. Fluid in the endometrial cavity.                               2. Thickened endometrium.                               3. Postmenopausal.                               4. Hormone replacement therapy.                               5. Using Vagifem for vaginal dryness.  DISPOSITION:                  Admit for a hysterotomy and dilatation. DD:  12/21/00 TD:  12/21/00 Job: [id number]

## 2011-01-10 NOTE — Discharge Summary (Signed)
[name], [name] NO.:  [id number]   MEDICAL RECORD NO.:  [id number]          PATIENT TYPE:  INP   LOCATION:  1507                         FACILITY:  [facility]   PHYSICIAN:  Ollen Gross, M.D.    DATE OF BIRTH:  [birth year]   DATE OF ADMISSION:  05/30/2006  DATE OF DISCHARGE:  06/02/2008                                 DISCHARGE SUMMARY   ADMITTING DIAGNOSES:  1. Osteoarthritis, right knee.  2. History of migraines.  3. History of pneumonia.  4. History of supraventricular tachycardia.  5. History of urinary tract infections.  6. History of cystitis.  7. History of renal calculi.  8. History of basal cell skin cancer.  9. Lumbar degenerative disk disease.  10.Postmenopausal.   DISCHARGE DIAGNOSES:  1. Osteoarthritis, right knee, status post right total knee arthroplasty.  2. Postoperative hyponatremia, improved.  3. Osteoarthritis, right knee.  4. History of migraines.  5. History of pneumonia.  6. History of supraventricular tachycardia.  7. History of urinary tract infections.  8. History of cystitis.  9. History of renal calculi.  10.History of basal cell skin cancer.  11.Lumbar degenerative disk disease.  12.Postmenopausal.   PROCEDURE:  May 30, 2006, right total knee.   SURGEON:  Dr. Lequita Halt.   ANESTHESIA:  Spinal.   CONSULTATIONS:  None.   BRIEF HISTORY:  Sarah Valdez is a 65 year old female with end-stage arthritis of  right knee with intractable pain.  Now presents for a total knee  arthroplasty.   LABORATORY DATA:  CBC on admission:  hemoglobin 14.4, hematocrit 41.5, white  cell count 5.5.  Serial CBCs were followed.  Hemoglobin 12.1 postop.  Last  hemoglobin and hematocrit 11.7 and 34.7.  PT/PTT on admission 13.3 and 26  respectively.  INR 1.  Serial pro times followed.  Last noted PT/INR 20.5  and 1.7.  Chem panel on admission all within normal limits.  Serial BMETs  were followed.  Sodium did drop from 144 to 133 back up to 140.   Remaining  BMETs within normal limits.  Preop UA:  Trace leukocyte esterase, few  epithelials, 0 to 2 white cells, otherwise negative.  _________ positive.  Two-view chest, May 22, 2006:  Bronchitic changes, likely chronic.  No  acute cardiopulmonary disease.  EKG dated September 30, 2005:  Sinus  bradycardia, otherwise normal.  Rate 59, confirmed.  Unable to read  signature.   HOSPITAL COURSE:  Patient admitted to [facility] underwent surgery and  was recovering on the orthopedic floors. Placed on PC pump and p.o.  analgesics for pain control after surgery,  given Coumadin for DVT  prophylaxis.  Given 24 hours post -op antibiotics.  Did have some pain on  morning of day 1.  Hemovac drain was pulled.  Therapy was started.  Encouraged p.o. meds. By day 2, she was doing a little bit better.  Starting  to get up with therapy a little bit more and actually walked about 65 feet.  Dressing was changed and incision looked good.  Foley was removed.  Weaned  off the PCA over to p.o. meds.  The sodium, which was low on day 1, had come  back up after reducing the fluids.  The fluids were discontinued on day 2.  Sodium was back up to 140.  Continued to progress well.  Seen on morning of  day 3 of June 02, 2006.  Progressive walking with therapy.  Tolerating  p.o. meds, and was discharged home.   DISCHARGE PLAN:  1. Patient was discharged home on June 02, 2006.  2. Discharge diagnosis, please see above.  3. Discharge meds:  Coumadin, Robaxin, Vicodin.   DIET:  As tolerated.   FOLLOWUP:  Two weeks.   ACTIVITY:  Home PT, home health nursing Coumadin protocol.  Weightbearing as  tolerated.   DISPOSITION:  Home.   CONDITION ON DISCHARGE:  Improved.      Alexzandrew L. Julien Girt, P.A.      Ollen Gross, M.D.  Electronically Signed    ALP/MEDQ  D:  06/02/2006  T:  06/03/2006  Job:  [id number]

## 2011-01-10 NOTE — Op Note (Signed)
NAMELARAMIE, GELLES NO.:  [id number]   MEDICAL RECORD NO.:  [id number]          PATIENT TYPE:  INP   LOCATION:  0003                         FACILITY:  [facility]   PHYSICIAN:  Ollen Gross, M.D.    DATE OF BIRTH:  01-Mar-1946   DATE OF PROCEDURE:  05/30/2006  DATE OF DISCHARGE:                                 OPERATIVE REPORT   PREOPERATIVE DIAGNOSIS:  Osteoarthritis, right knee.   POSTOPERATIVE DIAGNOSIS:  Osteoarthritis, right knee.   PROCEDURE:  Right total knee arthroplasty.   SURGEON:  Ollen Gross, M.D.   ASSISTANT:  Alexzandrew L. Julien Girt, P.A.   ANESTHESIA:  Spinal.   ESTIMATED BLOOD LOSS:  Minimal.   DRAIN:  Hemovac times one.   TOURNIQUET TIME:  Forty minutes at 300 mmHg.   COMPLICATIONS:  None.   CONDITION.:  Stable to recovery.   BRIEF CLINICAL NOTE:  Sarah Valdez is a 65 year old female with end-stage  osteoarthritis of the right knee.  She has had intractable pain and presents  for total knee arthroplasty.   PROCEDURE IN DETAIL:  After the successful initiation of spinal anesthetic,  a tourniquet was placed high on the right thigh and right lower extremity  prepped and draped in the usual sterile fashion.  The extremity was wrapped  in Esmarch, the knee flexed, the tourniquet inflated to 300 mmHg.  She had  an old medial peripatellar scar, so I re-utilized that.  The skin cut with  #10 blade through the subcutaneous tissue to the level of the extensor  mechanism.  A fresh blade was used to make a medial parapatellar arthrotomy.  Soft tissue of the proximal medial tibia and subperiosteally elevated to the  joint line with a knife into the semimembranosus bursa with a Cobb elevator.  The soft tissue laterally was elevated with attention being paid to avoiding  the patellar tendon on the tibial tubercle.  The patella subluxed laterally,  the knee flexed to 90 degrees, ACL and PCL removed.  A drill was used to  create a starting hole in the  distal femur and the canal was thoroughly  irrigated.  A 5-degree right valgus alignment guide was placed and  referencing off the posterior condyles rotation was marked on the block pin  to remove 10 mm of the distal femur.  Distal femoral resection is made with  an oscillating saw.  Sizing blocks were placed and a size 3 was most  appropriate.  Rotation was marked at the epicondylar axis.  Size 3 cutting  block was placed in the anterior-posterior and chamfer cuts were made.   The tibia subluxed forward and the menisci were removed.  Extramedullary  tibial alignment guide was placed referencing proximally with the medial  aspect of tibial tubercle and distally along the second metatarsal axis and  tibial crest.  The block was pinned to remove 10 mm of the non-deficient  lateral side.  Tibial resection was made with an oscillating saw.  Size 3  was the most appropriate tibial component and the proximal tibia was  prepared with the modular drill and keel punch  for size 3.  Femoral  preparation is completed with the intercondylar cut.   Size 3 mobile-bearing tibial, trial size 3 posterior stabilized femoral  trial, and a 10 mm posterior stabilized rotating platform insert trials were  placed.  With the 10 full extensions achieved with excellent varus and  valgus balance throughout full range of motion.  The patella was then  everted and thickness measured to be 24 mm.  Freehand resection was taken to  14 mm, a 38 template was placed, lug holes were drilled, trial patella was  placed, and it tracked normally.  Osteophytes were removed with the  posterior femur with the trial in place.  All trials were removed and the  cut bone surfaces prepared with pulsatile lavage.  Cement was mixed and once  ready for implantation, a size 3 mobile-bearing tibial tray, a size 3  posterior stabilized femur, and 38 patella were cemented into place and  patella was held with the clamp.  Trial 10 mm insert  were placed beyond full  extension and all extruded cement removed.  Once cement fully hardened, the  permanent 10 mm posterior stabilized rotating platform insert was placed  into the tibial tray.  The wound was copiously irrigated with saline  solution and extensor mechanism closed over a Hemovac drain with interrupted  #1 PDS.  Flexion against gravity was 135 degrees.  The tourniquet was  released for a total time of 40 minutes.  The subcutaneous was closed with  interrupted 2-0 Vicryl, the subcuticular with running 4-0 Monocryl.  The  incision was cleaned and dried, and Steri-Strips and a bulky sterile  dressing applied.  Drains hooked to suction.  She was placed into a knee  immobilizer, awakened, and transported to recovery in stable condition.      Ollen Gross, M.D.  Electronically Signed     FA/MEDQ  D:  05/30/2006  T:  06/01/2006  Job:  [id number]

## 2011-01-10 NOTE — Procedures (Signed)
[facility]  Patient:    Sarah Valdez, Sarah Valdez                       MRN: [id number] Adm. Date:  [id number] Attending:  Mervin Hack CC:         Esmeralda Arthur, M.D.  Titus Dubin. Alwyn Ren, M.D. [facility]   Procedure Report  PROCEDURE:  Colonoscopy.  ENDOSCOPIST:  Hedwig Morton. Juanda Chance, M.D.  INDICATIONS:  This 65 year old white female has had several episodes of crampy abdominal pain followed by diarrhea; she also had one episode of large-volume bright red blood per rectum approximately five to six years ago and has had occasional blood per rectum on toilet tissue.  She has complained of dyspepsia, bloating, food intolerance, alternating diarrhea and constipation. There is a family history of colon polyps in her father who apparently had a colon resection; it is not clear whether this was a benign or malignant polyp. She is undergoing her first colonoscopy for evaluation of her symptoms.  ENDOSCOPE:  Olympus single-channel videoscope.  SEDATION:  Versed 9.5 mg IV, Demerol 100 mg IV.  FINDINGS:  Olympus single-channel videoscope was passed directly into the rectum to the sigmoid colon.  Patient was monitored by a pulse oximeter; her oxygen saturations were satisfactory.  Her prep was adequate.  Anal canal was normal.  Retroflexion of endoscope in the rectum showed prominent hemorrhoidal veins but no definite hemorrhoids.  There was a small flat-appearing 3-mm polyp in the rectal ampulla which was ablated with hot biopsies and tissue sent to pathology.  The sigmoid colon was somewhat tortuous with large haustral folds but no definite diverticula.  Descending colon, splenic flexure, transverse colon, hepatic flexure and ascending colon were unremarkable including cecum.  Cecal pouch and ileocecal valve were normal. Colonoscope was then slowly retracted through the right transverse and through the left colon.  No additional polyps were seen.  Patient tolerated  procedure well.  IMPRESSION 1. Rectal polyp, status post polypectomy. 2. Pre-diverticular changes of the left colon.  PLAN 1. Treat as irritable bowel syndrome with a high-fiber diet and fiber    supplements. 2. Await results of the polyp histology. 3. ____ colonoscopy in five years; the definite decision will be made on the    basis of the polyp report. DD:  10/26/00 TD:  10/27/00 Job: [id number]

## 2011-01-10 NOTE — Op Note (Signed)
[facility]  Patient:    Sarah Valdez, Sarah Valdez                       MRN: [id number] Proc. Date: 12/22/00 Adm. Date:  [id number] Attending:  Amanda Cockayne                           Operative Report  PREOPERATIVE DIAGNOSIS:       Fluid in the endometrial cavity, questionable                               polyp.  POSTOPERATIVE DIAGNOSIS:      ______ , probable endometrial polyp.  OPERATION:                    ______ .  SURGEON:                      Esmeralda Arthur, M.D.  ANESTHESIA:                   General.  PACKS:                        None.  CATHETERS:                    None.  ESTIMATED BLOOD LOSS:         30 cc.  MEDIUM:                       Sorbitol, was a deficit of 320 cc.  FINDINGS:                     A curettage was done and I think a polyp was removed. A hysteroscopy was done and we noticed that the fluid deficit was increasing. I was not aware of perforating the uterus, but decided not to do any further surgery with the hysteroscope.  DESCRIPTION OF PROCEDURE:    The patient was carried to the operating room and after satisfactory anesthesia, she was placed in the lithotomy position, prepped and draped in a sterile field.  Examination revealed the uterus to be in midplane, normal size, adnexa revealed no masses.  A weighted speculum was placed in the posterior vagina, cervix grasped with a tenaculum. The cervix was grasped with a hemostat. The external os of the cervix was then dilated with a hemostat until we opened it. The uterus was then sounded with the small ______ ; we sounded to a #26 and we sounded the uterus 12 cm.  We then inserted the observation scope and you could see she had stimulated endometrium, and a questionable polyp. We then withdrew the scope but the resectoscope being in and then when we injected fluid, the deficit was 320 and just kept going, and we stopped. We then decided to do a curettage  and we curetted with a sharp curet and the serrated curet and I think we removed the polyp.  The patient was observed. She no excessive bleeding. The procedure was terminated and she was carried to the recovery room in good condition. DD:  12/22/00 TD:  12/22/00 Job: [id number]

## 2011-01-10 NOTE — H&P (Signed)
Sarah Valdez, Sarah Valdez NO.:  [id number]   MEDICAL RECORD NO.:  [id number]          PATIENT TYPE:  INP   LOCATION:  1507                         FACILITY:  [facility]   PHYSICIAN:  Ollen Gross, M.D.    DATE OF BIRTH:  27-Mar-1946   DATE OF ADMISSION:  05/30/2006  DATE OF DISCHARGE:                                HISTORY & PHYSICAL   DATE OF OFFICE VISIT/HISTORY AND PHYSICAL:  May 21, 2006.   CHIEF COMPLAINT:  Right knee pain.   HISTORY OF PRESENT ILLNESS:  The patient is a 65 year old female who has  been seen by Dr. Lequita Halt for ongoing knee pain.  She has had problems with  her knee for many years now, she has had meniscal tear with open  meniscectomies and also had a postoperative staph infection after one of her  knee surgeries.  Unfortunately, she has had progressive pain.  She is seen  now and found to have end-stage arthritis with involvement of two other  compartments.  It is felt she has reached a point where she could benefit  from undergoing surgical interventions.  Risks and benefits discussed.  The  patient is subsequently admitted to the hospital.   ALLERGIES:  PENICILLIN AND MORPHINE.   CURRENT MEDICATIONS:  Aleve, estrogen Femring, Claritin, atenolol,  Prometrium.   PAST MEDICAL HISTORY:  1. Seldom history of migraines.  2. History of pneumonia.  3. History of supraventricular tachycardia.  4. History of UTIs.  5. Cystitis.  6. History of renal calculi.  7. History of basal cell skin cancer.  8. Degenerative disk disease.  9. Postmenopausal.   PAST SURGICAL HISTORY:  1. Tonsillectomy.  2. Appendectomy.  3. Open meniscectomy in 1964.  4. Tubal ligation.  5. Kidney stone excision.  6. Three lithotripsies.  7. Blepharoplasty.  8. Face lift.   SOCIAL HISTORY:  Married, homemaker, not a smoker, social intake of alcohol,  two children.   FAMILY HISTORY:  Mother deceased with massive MI, heart disease age 80.  Sister living age 25  with breast cancer.  Also, family history of  hypertension.   REVIEW OF SYSTEMS:  GENERAL:  No fevers, chills or night sweats.  NEURO:  No  seizures, syncope or paralysis.  RESPIRATORY:  No shortness of breath ,  productive cough or hemoptysis.  CARDIOVASCULAR:  History of SVT with rapid  rate in the past.  No chest pain, angina or orthopnea.  GI:  No nausea,  vomiting, diarrhea, or constipation.  GU:  No dysuria, hematuria or  discharge.  MUSCULOSKELETAL:  Right knee.   PHYSICAL EXAMINATION:  VITAL SIGNS:  Pulse 64, respirations 12, blood  pressure 120/72.  GENERAL:  A 65 year old white female well nourished, well developed, in no  acute distress.  She is alert, oriented, cooperative, pleasant, good  historian.  HEENT:  Normocephalic, atraumatic.  Pupils round and reactive.  Oropharynx  clear.  EOMs intact.  NECK:  Sipple.  CHEST:  Clear.  HEART:  Regular rate and rhythm, no murmur, S1 and S2 noted.  ABDOMEN:  Soft, nontender, bowel sounds present.  RECTAL/BREASTS/GENITALIA:  Not done not pertinent to present illness.  EXTREMITIES:  Right knee shows range of motion 5-150, marked crepitus noted,  varus malalignment deformity.   IMPRESSION:  1. Osteoarthritis right knee.  2. History of migraines.  3. History of pneumonia.  4. History of supraventricular tachycardia.  5. History of urinary tract infections.  6. History of cystitis.  7. History of renal calculi.  8. History of basal cell skin cancer.  9. Lumbar degenerative disk disease.  10.Postmenopausal.   PLAN:  The patient admitted to [facility] to undergo a right total  knee arthroplasty.  Surgery will be performed by Dr. Ollen Gross.      Alexzandrew L. Julien Girt, P.A.      Ollen Gross, M.D.  Electronically Signed    ALP/MEDQ  D:  05/31/2006  T:  06/01/2006  Job:  [id number]   cc:   Titus Dubin. Alwyn Ren, MD,FACP,FCCP  [phone] [place]  [place] [address]

## 2011-01-13 ENCOUNTER — Other Ambulatory Visit: Payer: Self-pay | Admitting: *Deleted

## 2011-01-13 MED ORDER — DILTIAZEM HCL 120 MG PO TABS: 120.0000 mg | ORAL_TABLET | Freq: Two times a day (BID) | ORAL | Status: DC

## 2011-02-04 ENCOUNTER — Other Ambulatory Visit: Payer: Self-pay | Admitting: Internal Medicine

## 2011-05-29 LAB — HEMOGLOBIN AND HEMATOCRIT, BLOOD: HCT: 41.5 % (ref 36.0–46.0)

## 2011-06-03 ENCOUNTER — Other Ambulatory Visit: Payer: Self-pay | Admitting: Internal Medicine

## 2011-07-12 ENCOUNTER — Other Ambulatory Visit: Payer: Self-pay | Admitting: Internal Medicine

## 2011-08-07 ENCOUNTER — Encounter: Payer: Self-pay | Admitting: Family Medicine

## 2011-08-07 ENCOUNTER — Ambulatory Visit (INDEPENDENT_AMBULATORY_CARE_PROVIDER_SITE_OTHER): Payer: Medicare Other | Admitting: Family Medicine

## 2011-08-07 VITALS — BP 110/68 | HR 83 | Temp 100.1°F | Wt 165.0 lb

## 2011-08-07 DIAGNOSIS — J40 Bronchitis, not specified as acute or chronic: Secondary | ICD-10-CM

## 2011-08-07 DIAGNOSIS — J209 Acute bronchitis, unspecified: Secondary | ICD-10-CM

## 2011-08-07 MED ORDER — AZITHROMYCIN 250 MG PO TABS: ORAL_TABLET | ORAL | Status: AC

## 2011-08-07 NOTE — Progress Notes (Signed)
  Subjective:     Sarah Valdez is a 65 y.o. female here for evaluation of a cough. Onset of symptoms was 3 weeks ago. Symptoms have been gradually worsening since that time. The cough is productive and is aggravated by exercise. Associated symptoms include: chills, fever and shortness of breath. Patient does not have a history of asthma. Patient does have a history of environmental allergens. Patient has not traveled recently. Patient does have a history of smoking. Patient has not had a previous chest x-ray. Patient has not had a PPD done.  The following portions of the patient's history were reviewed and updated as appropriate: allergies, current medications, past family history, past medical history, past social history, past surgical history and problem list.  Review of Systems Pertinent items are noted in HPI.    Objective:    Oxygen saturation 95% on room air BP 110/68  Pulse 83  Temp(Src) 100.1 F (37.8 C) (Oral)  Wt 165 lb (74.844 kg)  SpO2 95% General appearance: alert, cooperative, appears stated age and no distress Ears: normal TM's and external ear canals both ears Nose: Nares normal. Septum midline. Mucosa normal. No drainage or sinus tenderness. Throat: lips, mucosa, and tongue normal; teeth and gums normal Neck: no adenopathy, no carotid bruit, no JVD, supple, symmetrical, trachea midline and thyroid not enlarged, symmetric, no tenderness/mass/nodules Lungs: clear to auscultation bilaterally Heart: regular rate and rhythm, S1, S2 normal, no murmur, click, rub or gallop    Assessment:    Acute Bronchitis    Plan:    Antibiotics per medication orders. Antitussives per medication orders. Avoid exposure to tobacco smoke and fumes. Call if shortness of breath worsens, blood in sputum, change in character of cough, development of fever or chills, inability to maintain nutrition and hydration. Avoid exposure to tobacco smoke and fumes. Steroid inhaler as ordered.

## 2011-08-07 NOTE — Patient Instructions (Signed)

## 2011-10-18 ENCOUNTER — Other Ambulatory Visit: Payer: Self-pay | Admitting: Internal Medicine

## 2011-11-03 DIAGNOSIS — C44519 Basal cell carcinoma of skin of other part of trunk: Secondary | ICD-10-CM | POA: Diagnosis not present

## 2011-11-03 DIAGNOSIS — Z85828 Personal history of other malignant neoplasm of skin: Secondary | ICD-10-CM | POA: Diagnosis not present

## 2011-11-03 DIAGNOSIS — L570 Actinic keratosis: Secondary | ICD-10-CM | POA: Diagnosis not present

## 2011-11-03 DIAGNOSIS — L819 Disorder of pigmentation, unspecified: Secondary | ICD-10-CM | POA: Diagnosis not present

## 2011-11-03 DIAGNOSIS — L439 Lichen planus, unspecified: Secondary | ICD-10-CM | POA: Diagnosis not present

## 2011-11-03 DIAGNOSIS — L821 Other seborrheic keratosis: Secondary | ICD-10-CM | POA: Diagnosis not present

## 2011-11-03 DIAGNOSIS — D485 Neoplasm of uncertain behavior of skin: Secondary | ICD-10-CM | POA: Diagnosis not present

## 2011-11-10 ENCOUNTER — Other Ambulatory Visit: Payer: Self-pay | Admitting: Internal Medicine

## 2011-11-10 DIAGNOSIS — Z1231 Encounter for screening mammogram for malignant neoplasm of breast: Secondary | ICD-10-CM

## 2011-11-14 DIAGNOSIS — C44519 Basal cell carcinoma of skin of other part of trunk: Secondary | ICD-10-CM | POA: Diagnosis not present

## 2011-11-28 ENCOUNTER — Other Ambulatory Visit: Payer: Self-pay | Admitting: Internal Medicine

## 2011-12-08 ENCOUNTER — Ambulatory Visit
Admission: RE | Admit: 2011-12-08 | Discharge: 2011-12-08 | Disposition: A | Discharge status: Home / Self Care | Payer: Medicare Other | Source: Ambulatory Visit | Attending: Internal Medicine | Admitting: Internal Medicine

## 2011-12-08 ENCOUNTER — Other Ambulatory Visit: Payer: Self-pay | Admitting: Internal Medicine

## 2011-12-08 DIAGNOSIS — Z1231 Encounter for screening mammogram for malignant neoplasm of breast: Secondary | ICD-10-CM | POA: Diagnosis not present

## 2011-12-11 DIAGNOSIS — H2510 Age-related nuclear cataract, unspecified eye: Secondary | ICD-10-CM | POA: Diagnosis not present

## 2011-12-12 DIAGNOSIS — N200 Calculus of kidney: Secondary | ICD-10-CM | POA: Diagnosis not present

## 2011-12-12 DIAGNOSIS — N201 Calculus of ureter: Secondary | ICD-10-CM | POA: Diagnosis not present

## 2011-12-12 DIAGNOSIS — R3129 Other microscopic hematuria: Secondary | ICD-10-CM | POA: Diagnosis not present

## 2011-12-12 DIAGNOSIS — N1330 Unspecified hydronephrosis: Secondary | ICD-10-CM | POA: Diagnosis not present

## 2011-12-12 DIAGNOSIS — N2889 Other specified disorders of kidney and ureter: Secondary | ICD-10-CM | POA: Diagnosis not present

## 2011-12-24 DIAGNOSIS — N1330 Unspecified hydronephrosis: Secondary | ICD-10-CM | POA: Diagnosis not present

## 2011-12-24 DIAGNOSIS — N201 Calculus of ureter: Secondary | ICD-10-CM | POA: Diagnosis not present

## 2011-12-30 ENCOUNTER — Encounter: Payer: Self-pay | Admitting: Internal Medicine

## 2011-12-30 ENCOUNTER — Ambulatory Visit (INDEPENDENT_AMBULATORY_CARE_PROVIDER_SITE_OTHER): Payer: Medicare Other | Admitting: Internal Medicine

## 2011-12-30 VITALS — BP 114/72 | HR 72 | Temp 98.3°F | Resp 12 | Ht 67.03 in | Wt 164.6 lb

## 2011-12-30 DIAGNOSIS — E785 Hyperlipidemia, unspecified: Secondary | ICD-10-CM | POA: Diagnosis not present

## 2011-12-30 DIAGNOSIS — Z0000 Encounter for general adult medical examination without abnormal findings: Secondary | ICD-10-CM

## 2011-12-30 DIAGNOSIS — E559 Vitamin D deficiency, unspecified: Secondary | ICD-10-CM | POA: Diagnosis not present

## 2011-12-30 DIAGNOSIS — Z86010 Personal history of colonic polyps: Secondary | ICD-10-CM | POA: Diagnosis not present

## 2011-12-30 DIAGNOSIS — M8580 Other specified disorders of bone density and structure, unspecified site: Secondary | ICD-10-CM

## 2011-12-30 LAB — CBC WITH DIFFERENTIAL/PLATELET
Basophils Absolute: 0 10*3/uL (ref 0.0–0.1)
Eosinophils Relative: 2.1 % (ref 0.0–5.0)
Lymphs Abs: 2.4 10*3/uL (ref 0.7–4.0)
Monocytes Absolute: 0.6 10*3/uL (ref 0.1–1.0)
Monocytes Relative: 8.5 % (ref 3.0–12.0)
Neutrophils Relative %: 52.4 % (ref 43.0–77.0)
Platelets: 200 10*3/uL (ref 150.0–400.0)
RDW: 13.3 % (ref 11.5–14.6)
WBC: 6.6 10*3/uL (ref 4.5–10.5)

## 2011-12-30 LAB — BASIC METABOLIC PANEL
BUN: 22 mg/dL (ref 6–23)
Creatinine, Ser: 0.8 mg/dL (ref 0.4–1.2)
GFR: 74.84 mL/min (ref >60.00)
Glucose, Bld: 91 mg/dL (ref 70–99)

## 2011-12-30 LAB — LIPID PANEL
HDL: 66.9 mg/dL (ref >39.00)
Total CHOL/HDL Ratio: 2
Triglycerides: 167 mg/dL — ABNORMAL HIGH (ref 0.0–149.0)
VLDL: 33.4 mg/dL (ref 0.0–40.0)

## 2011-12-30 LAB — HEPATIC FUNCTION PANEL
Alkaline Phosphatase: 60 U/L (ref 39–117)
Bilirubin, Direct: 0 mg/dL (ref 0.0–0.3)

## 2011-12-30 NOTE — Patient Instructions (Signed)
Preventive Health Care: Exercise  30-45  minutes a day, 3-4 days a week. Walking is especially valuable in preventing Osteoporosis. Eat a low-fat diet with lots of fruits and vegetables, up to 7-9 servings per day. Consume less than 30 grams of sugar per day from foods & drinks with High Fructose Corn Syrup as # 1,2,3 or #4 on label. Please try to go on My Chart within the next 24 hours to allow me to release the results directly to you.  

## 2011-12-30 NOTE — Progress Notes (Signed)
Subjective:    Patient ID: Sarah Valdez, female    DOB: Dec 31, 1945, 66 y.o.   MRN: [id number]  HPI Medicare Wellness Visit:  The following psychosocial & medical history were reviewed as required by Medicare.   Social history: caffeine: 2 cups coffee , alcohol: 7/week ,  tobacco use : never  & exercise : 20 min daily as walking.   Home & personal  safety / fall risk:no issues, activities of daily living: no limitations , seatbelt use : yes , and smoke alarm employment : yes.  Power of Attorney/Living Will status :needs signed  Vision ( as recorded per Nurse) & Hearing  evaluation : Ophth exam 4/13. See exam. Orientation :oriented X 3 , memory & recall :excellent, spelling testing: excellent,and mood & affect : normal  . Depression / anxiety: denied Travel history : 2010 [place] , immunization status :PNA needed , transfusion history:  no, and preventive health surveillance ( colonoscopies, BMD , etc as per protocol/ [facility]): colonoscopy needed ?, Dental care:  Every 6 months . Chart reviewed &  Updated. Active issues reviewed & addressed.       Review of Systems Dyslipidemia assessment: Prior Advanced Lipid Testing: NMR.   Family history of premature CAD/ MI: mother @ 13 ; MGF @ 20.  Nutrition:heart healthy  Diabetes : no . HTN: no Weight : stable ROS: fatigue:no ; chest pain : no ;claudication: no; palpitations: not with meds; abd pain/bowel changes: no ; myalgias:no;  syncope : no; memory loss:no;skin changes: no.    Objective:   Physical Exam Gen.: Healthy and well-nourished in appearance. Alert, appropriate and cooperative throughout exam. Head: Normocephalic without obvious abnormalities Eyes: No corneal or conjunctival inflammation noted. Pupils equal round reactive to light and accommodation. Fundal exam is benign without hemorrhages, exudate, papilledema. Extraocular motion intact. Vision grossly normal. Ears: External  ear exam reveals no significant lesions or deformities.  Canals clear .TMs normal. Hearing is grossly normal bilaterally. Nose: External nasal exam reveals no deformity or inflammation. Nasal mucosa are pink and moist. No lesions or exudates noted.  Mouth: Oral mucosa and oropharynx reveal no lesions or exudates. Teeth in good repair. Neck: No deformities, masses, or tenderness noted. Range of motion & Thyroid normal Lungs: Normal respiratory effort; chest expands symmetrically. Lungs are clear to auscultation without rales, wheezes, or increased work of breathing. Heart: Normal rate and rhythm. Normal S1 and S2. No gallop, click, or rub. S 4 w/o murmur. Abdomen: Bowel sounds normal; abdomen soft and nontender. No masses, organomegaly or hernias noted. Genitalia: Dr Tresa Res, Gyn         Musculoskeletal/extremities: No deformity or scoliosis noted of  the thoracic or lumbar spine; but there is some asymmetry of the posterior thoracic musculature suggesting occult scoliosis.  No clubbing, cyanosis, edema, or deformity noted. Range of motion  normal .Tone & strength  normal.Joints normal. Nail health  good. Vascular: Carotid, radial artery, dorsalis pedis and  posterior tibial pulses are full and equal. No bruits present. Neurologic: Alert and oriented x3. Deep tendon reflexes symmetrical and normal.          Skin: Intact without suspicious lesions or rashes. Lymph: No cervical, axillary lymphadenopathy present. Psych: Mood and affect are normal. Normally interactive  Assessment & Plan:  #1 Medicare Wellness Exam; criteria met ; data entered #2 Problem List reviewed ; Assessment/ Recommendations made Plan: see Orders

## 2011-12-31 LAB — VITAMIN D 25 HYDROXY (VIT D DEFICIENCY, FRACTURES): Vit D, 25-Hydroxy: 46 ng/mL (ref 30–89)

## 2012-01-05 ENCOUNTER — Ambulatory Visit
Admission: RE | Admit: 2012-01-05 | Discharge: 2012-01-05 | Disposition: A | Discharge status: Home / Self Care | Payer: Medicare Other | Source: Ambulatory Visit | Attending: Internal Medicine | Admitting: Internal Medicine

## 2012-01-05 DIAGNOSIS — M8580 Other specified disorders of bone density and structure, unspecified site: Secondary | ICD-10-CM

## 2012-01-05 DIAGNOSIS — M899 Disorder of bone, unspecified: Secondary | ICD-10-CM | POA: Diagnosis not present

## 2012-01-05 DIAGNOSIS — Z780 Asymptomatic menopausal state: Secondary | ICD-10-CM | POA: Diagnosis not present

## 2012-01-27 ENCOUNTER — Encounter: Payer: Self-pay | Admitting: Internal Medicine

## 2012-03-01 ENCOUNTER — Other Ambulatory Visit: Payer: Self-pay | Admitting: Internal Medicine

## 2012-03-01 NOTE — Telephone Encounter (Signed)
Refill done.  

## 2012-03-04 ENCOUNTER — Other Ambulatory Visit: Payer: Self-pay | Admitting: Internal Medicine

## 2012-05-20 ENCOUNTER — Telehealth: Payer: Self-pay | Admitting: Internal Medicine

## 2012-05-20 MED ORDER — LORATADINE 10 MG PO TABS: ORAL_TABLET | ORAL | Status: DC

## 2012-05-20 NOTE — Telephone Encounter (Signed)
Refill: Loratadine 10mg  tab. Take one tablet by mouth one time daily. Qty 90. Last fill 02-23-12

## 2012-05-20 NOTE — Telephone Encounter (Signed)
RX sent

## 2012-05-28 ENCOUNTER — Other Ambulatory Visit: Payer: Self-pay | Admitting: Internal Medicine

## 2012-05-29 DIAGNOSIS — Z23 Encounter for immunization: Secondary | ICD-10-CM | POA: Diagnosis not present

## 2012-07-08 ENCOUNTER — Other Ambulatory Visit: Payer: Self-pay | Admitting: Internal Medicine

## 2012-07-08 NOTE — Telephone Encounter (Signed)
Rx sent.    MW 

## 2012-08-27 ENCOUNTER — Other Ambulatory Visit: Payer: Self-pay | Admitting: *Deleted

## 2012-08-27 MED ORDER — PRAVASTATIN SODIUM 40 MG PO TABS: ORAL_TABLET | ORAL | Status: DC

## 2012-08-27 MED ORDER — DILTIAZEM HCL 120 MG PO TABS: ORAL_TABLET | ORAL | Status: DC

## 2012-08-27 NOTE — Telephone Encounter (Signed)
Pt request that Rx be faxed to [phone] to [facility] due to change in insurance. Pt aware Rx sent

## 2012-09-20 ENCOUNTER — Encounter: Payer: Self-pay | Admitting: Internal Medicine

## 2012-09-20 ENCOUNTER — Ambulatory Visit (INDEPENDENT_AMBULATORY_CARE_PROVIDER_SITE_OTHER): Payer: Medicare Other | Admitting: Internal Medicine

## 2012-09-20 VITALS — BP 114/78 | HR 73 | Wt 167.0 lb

## 2012-09-20 DIAGNOSIS — M949 Disorder of cartilage, unspecified: Secondary | ICD-10-CM

## 2012-09-20 DIAGNOSIS — G8929 Other chronic pain: Secondary | ICD-10-CM | POA: Diagnosis not present

## 2012-09-20 DIAGNOSIS — Z87442 Personal history of urinary calculi: Secondary | ICD-10-CM

## 2012-09-20 DIAGNOSIS — M545 Low back pain: Secondary | ICD-10-CM

## 2012-09-20 DIAGNOSIS — R52 Pain, unspecified: Secondary | ICD-10-CM

## 2012-09-20 DIAGNOSIS — M8580 Other specified disorders of bone density and structure, unspecified site: Secondary | ICD-10-CM | POA: Insufficient documentation

## 2012-09-20 DIAGNOSIS — E559 Vitamin D deficiency, unspecified: Secondary | ICD-10-CM

## 2012-09-20 DIAGNOSIS — IMO0002 Reserved for concepts with insufficient information to code with codable children: Secondary | ICD-10-CM

## 2012-09-20 MED ORDER — TRAMADOL HCL 50 MG PO TABS: 50.0000 mg | ORAL_TABLET | Freq: Four times a day (QID) | ORAL | Status: DC | PRN

## 2012-09-20 MED ORDER — CYCLOBENZAPRINE HCL 5 MG PO TABS: ORAL_TABLET | ORAL | Status: DC

## 2012-09-20 NOTE — Progress Notes (Signed)
Subjective:    Patient ID: Sarah Valdez, female    DOB: [birth year], 67 y.o.   MRN: [id number]  HPI She is here for followup of the bone density done in May 2013. It shows significant osteopenia and radius with a T score of -2.1. She is unable to take calcium supplementation because of history of kidney stones which were calcium based  X  7 . She previously been on high dose vitamin D supplementation; vitamin D level was 46 in May of 2013. She has never taken bone building therapy.  Problematic is a history of wrist osteonecrosis for which he saw Dr. Teressa Senter. He felt this was due to  long-term chronic steroids. Her sister has osteoporosis in the context of having taking chemotherapy for breast cancer. She has a history of degenerative disease of the spine. This has artificially raise the T score prompting monitor of the forearm and hips. For 3 months she has morning back pain. With stretches and Aleve she has been able to immobilize until 1/26   Review of Systems Back pain Location: LS area Quality: sharp with stiffness Duration: until she sits Severity: up to 10  Trigger/injury:no Exacerbating factors : standing & walking Relievers: Aleve until 1/26 Treatment/response:previously in PT for similar pain after golf or lifting  Fever/chills/sweats/weight loss:no Abdominal pain/melena/rectal bleeding/bowel change:no Flank pain/dysuria/hematuria/pyuria:no Rash/change in temperature or color:no Weakness/gait dysfunction/numbness and tingling/stool or urine incontinence:no Bruising/bleeding/lymphadenopathy:no          Objective:   Physical Exam Gen.: Thin but healthy and well-nourished in appearance. Alert, appropriate and cooperative throughout exam. Appears younger than stated age  Neck: No deformities, masses, or tenderness noted. Range of motion good Lungs: Normal respiratory effort; chest expands symmetrically. Lungs are clear to auscultation without rales, wheezes, or  increased work of breathing. Heart: Normal rate and rhythm. Normal S1 and S2. No gallop, click, or rub. No  murmur. Abdomen: Bowel sounds normal; abdomen soft and nontender. No masses, organomegaly or hernias noted. No AAA Excellent ;there is no aortic aneurysm present.  flank tenderness.                                Musculoskeletal/extremities: No deformity or scoliosis noted of  the thoracic or lumbar spine. No clubbing, cyanosis, edema, or significant extremity  deformity noted. Range of motion normal except @ R knee .Tone & strength  normal.Joints normal except fusiform R knee changes. Nail health good. Able to lie down & sit up w/o help. Negative SLR bilaterally but classic "low back crawl" exhibited Vascular: Carotid, radial artery, dorsalis pedis and  posterior tibial pulses are full and equal. No bruits present. Neurologic: Alert and oriented x3. Deep tendon reflexes symmetrical and normal. Gait including heel & toe walking normal.        Skin: Intact without suspicious lesions or rashes. Lymph: No cervical, axillary lymphadenopathy present. Psych: Mood and affect are normal. Normally interactive                                                                                         Assessment &  Plan:  #1 acute low back syndrome with no evidence of neuro- muscular deficit  #2 significant osteopenia, worse at the forearm. No history of bone building therapy. 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 Calcium supplementation not an option due to  history of calcium-containing renal calculi on 7 occasions.  #3 history of vitamin D deficiency; vitamin D level was normal 6 months ago.  Plan: See orders and recommendations. She does not meet the criteria for the parenteral agents and she is not osteoporotic. Bone density will need to be monitored every 25 months. Weekly bisphosphonates could be considered; the caveat is a history of osteonecrosis.

## 2012-09-20 NOTE — Patient Instructions (Addendum)
The best exercises for the low back include freestyle swimming, stretch aerobics, and yoga.  Recommended lifestyle interventions to prevent Osteoporosis include  vitamin D3 supplementation to keep vit D  level @ least 40-60. The usual vitamin D3 dose is 1000 IU daily; but individual dose is determined by annual vitamin D level monitor. Also weight bearing exercise such as  walking 30-45 minutes 3-4  X per week is recommended.

## 2012-09-27 ENCOUNTER — Encounter: Payer: Self-pay | Admitting: Internal Medicine

## 2012-10-09 ENCOUNTER — Other Ambulatory Visit: Payer: Self-pay

## 2012-11-02 DIAGNOSIS — D235 Other benign neoplasm of skin of trunk: Secondary | ICD-10-CM | POA: Diagnosis not present

## 2012-11-03 DIAGNOSIS — Q762 Congenital spondylolisthesis: Secondary | ICD-10-CM | POA: Diagnosis not present

## 2012-11-08 ENCOUNTER — Ambulatory Visit (INDEPENDENT_AMBULATORY_CARE_PROVIDER_SITE_OTHER): Payer: Medicare Other | Admitting: Family Medicine

## 2012-11-08 ENCOUNTER — Encounter: Payer: Self-pay | Admitting: Family Medicine

## 2012-11-08 VITALS — BP 110/70 | HR 68 | Temp 97.8°F | Ht 67.25 in | Wt 164.0 lb

## 2012-11-08 DIAGNOSIS — N390 Urinary tract infection, site not specified: Secondary | ICD-10-CM

## 2012-11-08 DIAGNOSIS — R3915 Urgency of urination: Secondary | ICD-10-CM

## 2012-11-08 LAB — POCT URINALYSIS DIPSTICK
Bilirubin, UA: NEGATIVE
Glucose, UA: NEGATIVE
Spec Grav, UA: 1.01

## 2012-11-08 NOTE — Assessment & Plan Note (Signed)
Suspect pt did have UTI but after 5 days of Clinda, has likely treated the infxn.  Will send urine for cx and start abx prn.  Reviewed supportive care and red flags that should prompt return.  Pt expressed understanding and is in agreement w/ plan.

## 2012-11-08 NOTE — Progress Notes (Signed)
  Subjective:    Patient ID: Sarah Valdez, female    DOB: 1946/08/17, 67 y.o.   MRN: [id number]  HPI ? UTI- sxs started late last week.  Was still having urinary frequency and started Clindamycin she had from the dentist (5 days).  + urgency.  Slight burning w/ urination.  No hesitancy.  No hematuria.  Hx of similar.  No back or CVA pain.  No suprapubic pain.  No fevers.   Review of Systems For ROS see HPI     Objective:   Physical Exam  Vitals reviewed. Constitutional: She appears well-developed and well-nourished. No distress.  Abdominal: Soft. She exhibits no distension. There is no tenderness (no suprapubic or CVA tenderness).          Assessment & Plan:

## 2012-11-10 DIAGNOSIS — IMO0002 Reserved for concepts with insufficient information to code with codable children: Secondary | ICD-10-CM | POA: Diagnosis not present

## 2012-11-10 DIAGNOSIS — Q762 Congenital spondylolisthesis: Secondary | ICD-10-CM | POA: Diagnosis not present

## 2012-11-10 LAB — URINE CULTURE: Colony Count: >=100000

## 2012-11-11 ENCOUNTER — Telehealth: Payer: Self-pay | Admitting: *Deleted

## 2012-11-11 MED ORDER — SULFAMETHOXAZOLE-TRIMETHOPRIM 800-160 MG PO TABS: 1.0000 | ORAL_TABLET | Freq: Two times a day (BID) | ORAL | Status: DC

## 2012-11-11 NOTE — Telephone Encounter (Signed)
Message copied by Sarah Valdez on Thu Nov 11, 2012  8:42 AM ------      Message from: Sheliah Hatch      Created: Wed Nov 10, 2012  1:49 PM       Pt w/ active UTI- needs to start Bactrim DS BID x7 days, #14 ------

## 2012-11-11 NOTE — Telephone Encounter (Signed)
Spoke with the pt and informed her of recent urine culture results and note.  Pt understood and agreed.  New rx for the Bactrim DS was sent to the pharmacy([facility]) by e-script.//AB/CMA

## 2012-11-12 DIAGNOSIS — M5137 Other intervertebral disc degeneration, lumbosacral region: Secondary | ICD-10-CM | POA: Diagnosis not present

## 2012-11-12 DIAGNOSIS — M999 Biomechanical lesion, unspecified: Secondary | ICD-10-CM | POA: Diagnosis not present

## 2012-11-12 DIAGNOSIS — Q762 Congenital spondylolisthesis: Secondary | ICD-10-CM | POA: Diagnosis not present

## 2012-11-12 DIAGNOSIS — IMO0002 Reserved for concepts with insufficient information to code with codable children: Secondary | ICD-10-CM | POA: Diagnosis not present

## 2012-11-15 DIAGNOSIS — Z01419 Encounter for gynecological examination (general) (routine) without abnormal findings: Secondary | ICD-10-CM | POA: Diagnosis not present

## 2012-11-15 DIAGNOSIS — Q762 Congenital spondylolisthesis: Secondary | ICD-10-CM | POA: Diagnosis not present

## 2012-11-15 DIAGNOSIS — M999 Biomechanical lesion, unspecified: Secondary | ICD-10-CM | POA: Diagnosis not present

## 2012-11-15 DIAGNOSIS — Z124 Encounter for screening for malignant neoplasm of cervix: Secondary | ICD-10-CM | POA: Diagnosis not present

## 2012-11-15 DIAGNOSIS — M5137 Other intervertebral disc degeneration, lumbosacral region: Secondary | ICD-10-CM | POA: Diagnosis not present

## 2012-11-15 DIAGNOSIS — IMO0002 Reserved for concepts with insufficient information to code with codable children: Secondary | ICD-10-CM | POA: Diagnosis not present

## 2012-11-16 ENCOUNTER — Other Ambulatory Visit: Payer: Self-pay | Admitting: Internal Medicine

## 2012-11-16 DIAGNOSIS — M5137 Other intervertebral disc degeneration, lumbosacral region: Secondary | ICD-10-CM | POA: Diagnosis not present

## 2012-11-16 DIAGNOSIS — Q762 Congenital spondylolisthesis: Secondary | ICD-10-CM | POA: Diagnosis not present

## 2012-11-16 DIAGNOSIS — M999 Biomechanical lesion, unspecified: Secondary | ICD-10-CM | POA: Diagnosis not present

## 2012-11-16 DIAGNOSIS — IMO0002 Reserved for concepts with insufficient information to code with codable children: Secondary | ICD-10-CM | POA: Diagnosis not present

## 2012-11-16 NOTE — Telephone Encounter (Signed)
Hopp please advise, Loratidine is not on medication list.

## 2012-11-16 NOTE — Telephone Encounter (Signed)
Available OTC but OK if her plan allows. #30 qd prn

## 2012-11-20 DIAGNOSIS — M999 Biomechanical lesion, unspecified: Secondary | ICD-10-CM | POA: Diagnosis not present

## 2012-11-20 DIAGNOSIS — IMO0002 Reserved for concepts with insufficient information to code with codable children: Secondary | ICD-10-CM | POA: Diagnosis not present

## 2012-11-20 DIAGNOSIS — Q762 Congenital spondylolisthesis: Secondary | ICD-10-CM | POA: Diagnosis not present

## 2012-11-20 DIAGNOSIS — M5137 Other intervertebral disc degeneration, lumbosacral region: Secondary | ICD-10-CM | POA: Diagnosis not present

## 2012-11-22 DIAGNOSIS — Q762 Congenital spondylolisthesis: Secondary | ICD-10-CM | POA: Diagnosis not present

## 2012-11-22 DIAGNOSIS — M999 Biomechanical lesion, unspecified: Secondary | ICD-10-CM | POA: Diagnosis not present

## 2012-11-22 DIAGNOSIS — IMO0002 Reserved for concepts with insufficient information to code with codable children: Secondary | ICD-10-CM | POA: Diagnosis not present

## 2012-11-22 DIAGNOSIS — M5137 Other intervertebral disc degeneration, lumbosacral region: Secondary | ICD-10-CM | POA: Diagnosis not present

## 2012-11-24 DIAGNOSIS — IMO0002 Reserved for concepts with insufficient information to code with codable children: Secondary | ICD-10-CM | POA: Diagnosis not present

## 2012-11-24 DIAGNOSIS — Q762 Congenital spondylolisthesis: Secondary | ICD-10-CM | POA: Diagnosis not present

## 2012-11-24 DIAGNOSIS — M5137 Other intervertebral disc degeneration, lumbosacral region: Secondary | ICD-10-CM | POA: Diagnosis not present

## 2012-11-24 DIAGNOSIS — M999 Biomechanical lesion, unspecified: Secondary | ICD-10-CM | POA: Diagnosis not present

## 2012-12-03 DIAGNOSIS — IMO0002 Reserved for concepts with insufficient information to code with codable children: Secondary | ICD-10-CM | POA: Diagnosis not present

## 2012-12-03 DIAGNOSIS — S8000XA Contusion of unspecified knee, initial encounter: Secondary | ICD-10-CM | POA: Diagnosis not present

## 2012-12-07 ENCOUNTER — Telehealth: Payer: Self-pay | Admitting: Internal Medicine

## 2012-12-07 MED ORDER — DILTIAZEM HCL 120 MG PO TABS: ORAL_TABLET | ORAL | Status: DC

## 2012-12-07 NOTE — Telephone Encounter (Signed)
RX sent, patient with pending appointment May 2014

## 2012-12-07 NOTE — Telephone Encounter (Signed)
Diltiazem 120mg  tablet Qty:90 Take one tablet twice dialy

## 2012-12-08 ENCOUNTER — Other Ambulatory Visit: Payer: Self-pay

## 2012-12-08 DIAGNOSIS — Z1231 Encounter for screening mammogram for malignant neoplasm of breast: Secondary | ICD-10-CM

## 2012-12-23 ENCOUNTER — Other Ambulatory Visit: Payer: Self-pay | Admitting: Internal Medicine

## 2012-12-29 ENCOUNTER — Encounter: Payer: Self-pay | Admitting: Lab

## 2012-12-30 ENCOUNTER — Ambulatory Visit (INDEPENDENT_AMBULATORY_CARE_PROVIDER_SITE_OTHER): Payer: Medicare Other | Admitting: Internal Medicine

## 2012-12-30 ENCOUNTER — Encounter: Payer: Self-pay | Admitting: Internal Medicine

## 2012-12-30 VITALS — BP 120/76 | HR 66 | Temp 97.8°F | Resp 12 | Ht 67.03 in | Wt 164.0 lb

## 2012-12-30 DIAGNOSIS — E559 Vitamin D deficiency, unspecified: Secondary | ICD-10-CM

## 2012-12-30 DIAGNOSIS — Z860100 Personal history of colon polyps, unspecified: Secondary | ICD-10-CM

## 2012-12-30 DIAGNOSIS — E785 Hyperlipidemia, unspecified: Secondary | ICD-10-CM

## 2012-12-30 DIAGNOSIS — Z0000 Encounter for general adult medical examination without abnormal findings: Secondary | ICD-10-CM | POA: Diagnosis not present

## 2012-12-30 LAB — CBC WITH DIFFERENTIAL/PLATELET
Basophils Absolute: 0 10*3/uL (ref 0.0–0.1)
Eosinophils Absolute: 0.2 10*3/uL (ref 0.0–0.7)
HCT: 43.8 % (ref 36.0–46.0)
Lymphs Abs: 2.6 10*3/uL (ref 0.7–4.0)
MCHC: 34 g/dL (ref 30.0–36.0)
MCV: 94.4 fl (ref 78.0–100.0)
Monocytes Absolute: 0.6 10*3/uL (ref 0.1–1.0)
Monocytes Relative: 9.8 % (ref 3.0–12.0)
Platelets: 203 10*3/uL (ref 150.0–400.0)
RDW: 13.9 % (ref 11.5–14.6)

## 2012-12-30 LAB — HEPATIC FUNCTION PANEL
ALT: 20 U/L (ref 0–35)
Bilirubin, Direct: 0 mg/dL (ref 0.0–0.3)
Total Bilirubin: 0.7 mg/dL (ref 0.3–1.2)

## 2012-12-30 LAB — BASIC METABOLIC PANEL
BUN: 23 mg/dL (ref 6–23)
CO2: 24 mEq/L (ref 19–32)
GFR: 70.57 mL/min (ref >60.00)
Glucose, Bld: 86 mg/dL (ref 70–99)
Potassium: 3.9 mEq/L (ref 3.5–5.1)

## 2012-12-30 LAB — LIPID PANEL: VLDL: 19.6 mg/dL (ref 0.0–40.0)

## 2012-12-30 NOTE — Progress Notes (Signed)
Subjective:    Patient ID: Sarah Valdez, female    DOB: [birth year], 67 y.o.   MRN: [id number]  HPI Medicare Wellness Visit:  Psychosocial & medical history were reviewed as required by Medicare (abuse,antisocial behavioral risks,firearm risk).  Social history: caffeine:1-2 cups coffee/day  , alcohol: 7 glasses wine / week  ,  tobacco use: never Exercise : see below  No home & personal  safety / fall risk Activities of daily living: no limitations  Seatbelt  and smoke alarm employed. Power of Attorney/Living Will status : in place Ophthalmology exam current Hearing evaluation not current Orientation :oriented X 3  Memory & recall :good Spelling  testing:good Mood & affect : normal . Depression / anxiety: denied Travel history : last 2012 [place]  Immunization status : Shingles needed Transfusion history:  none  Preventive health surveillance ( colonoscopy, BMD , mammograms,PAP as per protocol/ SOC): current  Dental care:  Every 12 mos. Chart reviewed &  Updated. Active issues reviewed & addressed.      Review of Systems She is on a heart healthy diet; she exercises as elliptical & swimming > 30 minutes 3-4  times per week without symptoms. Specifically she denies chest pain, palpitations, dyspnea, or claudication. Family history is negative for premature coronary disease . Advanced cholesterol testing reveals her LDL goal is less than 115. There is medication compliance with the statin. Significant abdominal symptoms, memory deficit, or myalgias denied. She is seeing Dr Shon Baton for chronic low back pain. There is no associated leg weakness, numbness, or tingling. She also has no associated incontinence of urine or stool. Chiropractory was of no significant benefit.     Objective:   Physical Exam Gen.: Healthy and well-nourished in appearance. Alert, appropriate and cooperative throughout exam.Appears younger than stated age  Head: Normocephalic without obvious abnormalities   Eyes: No corneal or conjunctival inflammation noted.  Extraocular motion intact. Vision grossly normal with lenses Ears: External  ear exam reveals no significant lesions or deformities. Canals clear .TMs normal. Hearing is grossly normal bilaterally. Nose: External nasal exam reveals no deformity or inflammation. Nasal mucosa are pink and moist. No lesions or exudates noted.   Mouth: Oral mucosa and oropharynx reveal no lesions or exudates. Teeth in good repair. Neck: No deformities, masses, or tenderness noted. Range of motion good. Thyroid normal. Lungs: Normal respiratory effort; chest expands symmetrically. Lungs are clear to auscultation without rales, wheezes, or increased work of breathing. Heart: Normal rate and rhythm. Normal S1 and S2. No gallop, click, or rub. S4 w/o murmur. Abdomen: Bowel sounds normal; abdomen soft and nontender. No masses, organomegaly or hernias noted. Genitalia: As per Gyn                                  Musculoskeletal/extremities: No deformity or scoliosis noted of  the thoracic or lumbar spine.  No clubbing, cyanosis, edema, or significant extremity  deformity noted. Range of motion decreased @ knees .Tone & strength  Normal. Joints normal . Nail health good. Able to lie down & sit up w/o help. Negative SLR bilaterally Vascular: Carotid, radial artery, dorsalis pedis and  posterior tibial pulses are full and equal. No bruits present. Neurologic: Alert and oriented x3. Deep tendon reflexes symmetrical and normal.     Skin: Intact without suspicious lesions or rashes. Lymph: No cervical, axillary lymphadenopathy present. Psych: Mood and affect are normal. Normally interactive  Assessment & Plan:  #1 Medicare Wellness Exam; criteria met ; data entered #2 Problem List reviewed ; Assessment/ Recommendations made Plan: see Orders

## 2012-12-30 NOTE — Patient Instructions (Addendum)
Please review the record and make any corrections; share this with all medical staff seen. 

## 2013-01-03 DIAGNOSIS — H2510 Age-related nuclear cataract, unspecified eye: Secondary | ICD-10-CM | POA: Diagnosis not present

## 2013-01-03 DIAGNOSIS — Z79899 Other long term (current) drug therapy: Secondary | ICD-10-CM | POA: Diagnosis not present

## 2013-01-03 DIAGNOSIS — H353190 Nonexudative age-related macular degeneration, unspecified eye, stage unspecified: Secondary | ICD-10-CM | POA: Diagnosis not present

## 2013-01-04 ENCOUNTER — Ambulatory Visit
Admission: RE | Admit: 2013-01-04 | Discharge: 2013-01-04 | Disposition: A | Discharge status: Home / Self Care | Payer: Medicare Other | Source: Ambulatory Visit

## 2013-01-04 DIAGNOSIS — Z1231 Encounter for screening mammogram for malignant neoplasm of breast: Secondary | ICD-10-CM

## 2013-01-04 LAB — VITAMIN D 1,25 DIHYDROXY: Vitamin D 1, 25 (OH)2 Total: 73 pg/mL — ABNORMAL HIGH (ref 18–72)

## 2013-03-15 ENCOUNTER — Other Ambulatory Visit: Payer: Self-pay | Admitting: *Deleted

## 2013-03-15 ENCOUNTER — Telehealth: Payer: Self-pay | Admitting: *Deleted

## 2013-03-15 MED ORDER — DILTIAZEM HCL 120 MG PO TABS: ORAL_TABLET | ORAL | Status: DC

## 2013-03-15 MED ORDER — PRAVASTATIN SODIUM 40 MG PO TABS: ORAL_TABLET | ORAL | Status: DC

## 2013-03-15 NOTE — Telephone Encounter (Signed)
OK X1 

## 2013-03-15 NOTE — Telephone Encounter (Signed)
Called patient and left a message for Sarah Valdez to call the office at her earliest convenience.  Patient has lab over due now.

## 2013-03-15 NOTE — Telephone Encounter (Signed)
Fax request for Diltiazem 120 mg tablet Last Rx: 04.15.14, #[id number]

## 2013-03-15 NOTE — Telephone Encounter (Signed)
Rx refilled for 30 days labs are over due.

## 2013-03-16 ENCOUNTER — Telehealth: Payer: Self-pay | Admitting: *Deleted

## 2013-03-16 NOTE — Telephone Encounter (Signed)
Patient left a message on the triage phone in concern for her medication. I called Sarah Valdez and left a voice mail message on her phone number she provided.

## 2013-03-18 ENCOUNTER — Other Ambulatory Visit: Payer: Self-pay | Admitting: *Deleted

## 2013-03-21 ENCOUNTER — Telehealth: Payer: Self-pay | Admitting: Internal Medicine

## 2013-03-21 MED ORDER — PRAVASTATIN SODIUM 40 MG PO TABS: ORAL_TABLET | ORAL | Status: DC

## 2013-03-21 NOTE — Telephone Encounter (Signed)
Patient received a call stating that she had outstanding labs that are due. Patient does not understand why when she had labs done 12/30/2012.  Please advise.

## 2013-03-21 NOTE — Telephone Encounter (Signed)
I reviewed chart, patient is not due for any labs, patient did have labs in May 2014. I called patient and apologized that she was mis-informed that she was due for labs. RX was resubmitted for a 90 day supply with 2 additional refills

## 2013-05-16 DIAGNOSIS — L570 Actinic keratosis: Secondary | ICD-10-CM | POA: Diagnosis not present

## 2013-05-16 DIAGNOSIS — D692 Other nonthrombocytopenic purpura: Secondary | ICD-10-CM | POA: Diagnosis not present

## 2013-06-10 ENCOUNTER — Ambulatory Visit (INDEPENDENT_AMBULATORY_CARE_PROVIDER_SITE_OTHER): Payer: Medicare Other | Admitting: Family Medicine

## 2013-06-10 ENCOUNTER — Encounter: Payer: Self-pay | Admitting: Family Medicine

## 2013-06-10 VITALS — BP 108/78 | HR 65 | Temp 98.4°F | Resp 16 | Wt 172.0 lb

## 2013-06-10 DIAGNOSIS — N390 Urinary tract infection, site not specified: Secondary | ICD-10-CM | POA: Diagnosis not present

## 2013-06-10 DIAGNOSIS — IMO0001 Reserved for inherently not codable concepts without codable children: Secondary | ICD-10-CM

## 2013-06-10 DIAGNOSIS — R350 Frequency of micturition: Secondary | ICD-10-CM

## 2013-06-10 DIAGNOSIS — M5380 Other specified dorsopathies, site unspecified: Secondary | ICD-10-CM

## 2013-06-10 DIAGNOSIS — M62830 Muscle spasm of back: Secondary | ICD-10-CM | POA: Insufficient documentation

## 2013-06-10 LAB — POCT URINALYSIS DIPSTICK
Leukocytes, UA: NEGATIVE
Nitrite, UA: NEGATIVE
Protein, UA: NEGATIVE
Urobilinogen, UA: 0.2

## 2013-06-10 MED ORDER — FUROSEMIDE 20 MG PO TABS: ORAL_TABLET | ORAL | Status: DC

## 2013-06-10 MED ORDER — CYCLOBENZAPRINE HCL 5 MG PO TABS: 5.0000 mg | ORAL_TABLET | Freq: Two times a day (BID) | ORAL | Status: DC | PRN

## 2013-06-10 MED ORDER — SULFAMETHOXAZOLE-TMP DS 800-160 MG PO TABS: 1.0000 | ORAL_TABLET | Freq: Two times a day (BID) | ORAL | Status: DC

## 2013-06-10 NOTE — Progress Notes (Signed)
  Subjective:    Patient ID: SAHANA BOYLAND, female    DOB: 01/09/1946, 67 y.o.   MRN: [id number]  HPI ? UTI- sxs started 10 days ago w/ urgency.  No dysuria.  + frequency.  No hesitancy.  + sensation of incomplete emptying.  No fevers.  No back pain.  Pt took husband's left over abx- doxycycline (has taken 10 pills)  Thought sxs had resolved until yesterday.  Was unable to sleep last night.  Back pain- recurrent problem for pt.  Has AM back spasms.  Interested in refill in flexeril.   Review of Systems For ROS see HPI     Objective:   Physical Exam  Vitals reviewed. Constitutional: She is oriented to person, place, and time. She appears well-developed and well-nourished. No distress.  Abdominal: Soft. She exhibits no distension. There is no tenderness (no suprapubic or CVA tenderness).  Neurological: She is alert and oriented to person, place, and time.          Assessment & Plan:

## 2013-06-10 NOTE — Patient Instructions (Signed)
Start the Bactrim twice daily for UTI Drink plenty of fluids Use the flexeril as needed Call with any questions or concerns HAPPY BIRTHDAY!!!

## 2013-06-12 LAB — URINE CULTURE: Colony Count: NO GROWTH

## 2013-06-12 NOTE — Assessment & Plan Note (Signed)
New to provider, recurrent problem for pt.  Refill on flexeril provided.

## 2013-06-12 NOTE — Assessment & Plan Note (Signed)
Pt reports sxs but has taken left over abx x10 days.  Will send urine for cx.  Will tx w/ abx to complete course- will stop abx if cx negative.  Pt expressed understanding and is in agreement w/ plan.

## 2013-06-22 ENCOUNTER — Encounter: Payer: Self-pay | Admitting: Internal Medicine

## 2013-06-22 DIAGNOSIS — Z23 Encounter for immunization: Secondary | ICD-10-CM | POA: Diagnosis not present

## 2013-06-24 MED ORDER — DILTIAZEM HCL 120 MG PO TABS: ORAL_TABLET | ORAL | Status: DC

## 2013-06-24 MED ORDER — PRAVASTATIN SODIUM 40 MG PO TABS: ORAL_TABLET | ORAL | Status: DC

## 2013-06-24 NOTE — Telephone Encounter (Signed)
Spoke with the pt and informed her the rx's will be sent to [facility].   Rx's sent to the pharmacy by e-script.//AB/CMA

## 2013-06-30 ENCOUNTER — Other Ambulatory Visit: Payer: Self-pay

## 2013-07-26 DIAGNOSIS — M545 Low back pain: Secondary | ICD-10-CM | POA: Diagnosis not present

## 2013-07-26 DIAGNOSIS — M4120 Other idiopathic scoliosis, site unspecified: Secondary | ICD-10-CM | POA: Diagnosis not present

## 2013-08-02 DIAGNOSIS — M545 Low back pain: Secondary | ICD-10-CM | POA: Diagnosis not present

## 2013-08-02 DIAGNOSIS — N200 Calculus of kidney: Secondary | ICD-10-CM | POA: Diagnosis not present

## 2013-08-12 DIAGNOSIS — M545 Low back pain: Secondary | ICD-10-CM | POA: Diagnosis not present

## 2013-08-31 DIAGNOSIS — M5137 Other intervertebral disc degeneration, lumbosacral region: Secondary | ICD-10-CM | POA: Diagnosis not present

## 2013-08-31 DIAGNOSIS — M48061 Spinal stenosis, lumbar region without neurogenic claudication: Secondary | ICD-10-CM | POA: Diagnosis not present

## 2013-08-31 DIAGNOSIS — M545 Low back pain, unspecified: Secondary | ICD-10-CM | POA: Diagnosis not present

## 2013-08-31 DIAGNOSIS — R262 Difficulty in walking, not elsewhere classified: Secondary | ICD-10-CM | POA: Diagnosis not present

## 2013-09-01 DIAGNOSIS — M5137 Other intervertebral disc degeneration, lumbosacral region: Secondary | ICD-10-CM | POA: Diagnosis not present

## 2013-09-01 DIAGNOSIS — M48061 Spinal stenosis, lumbar region without neurogenic claudication: Secondary | ICD-10-CM | POA: Diagnosis not present

## 2013-09-01 DIAGNOSIS — M545 Low back pain, unspecified: Secondary | ICD-10-CM | POA: Diagnosis not present

## 2013-09-01 DIAGNOSIS — R262 Difficulty in walking, not elsewhere classified: Secondary | ICD-10-CM | POA: Diagnosis not present

## 2013-09-06 DIAGNOSIS — M4120 Other idiopathic scoliosis, site unspecified: Secondary | ICD-10-CM | POA: Diagnosis not present

## 2013-09-06 DIAGNOSIS — M545 Low back pain, unspecified: Secondary | ICD-10-CM | POA: Diagnosis not present

## 2013-09-08 DIAGNOSIS — M545 Low back pain, unspecified: Secondary | ICD-10-CM | POA: Diagnosis not present

## 2013-09-08 DIAGNOSIS — M48061 Spinal stenosis, lumbar region without neurogenic claudication: Secondary | ICD-10-CM | POA: Diagnosis not present

## 2013-09-08 DIAGNOSIS — M5137 Other intervertebral disc degeneration, lumbosacral region: Secondary | ICD-10-CM | POA: Diagnosis not present

## 2013-09-08 DIAGNOSIS — R262 Difficulty in walking, not elsewhere classified: Secondary | ICD-10-CM | POA: Diagnosis not present

## 2013-09-20 DIAGNOSIS — M545 Low back pain, unspecified: Secondary | ICD-10-CM | POA: Diagnosis not present

## 2013-09-20 DIAGNOSIS — M48061 Spinal stenosis, lumbar region without neurogenic claudication: Secondary | ICD-10-CM | POA: Diagnosis not present

## 2013-09-20 DIAGNOSIS — R262 Difficulty in walking, not elsewhere classified: Secondary | ICD-10-CM | POA: Diagnosis not present

## 2013-09-20 DIAGNOSIS — M5137 Other intervertebral disc degeneration, lumbosacral region: Secondary | ICD-10-CM | POA: Diagnosis not present

## 2013-09-22 DIAGNOSIS — M5137 Other intervertebral disc degeneration, lumbosacral region: Secondary | ICD-10-CM | POA: Diagnosis not present

## 2013-09-22 DIAGNOSIS — R262 Difficulty in walking, not elsewhere classified: Secondary | ICD-10-CM | POA: Diagnosis not present

## 2013-09-22 DIAGNOSIS — M545 Low back pain, unspecified: Secondary | ICD-10-CM | POA: Diagnosis not present

## 2013-09-22 DIAGNOSIS — M48061 Spinal stenosis, lumbar region without neurogenic claudication: Secondary | ICD-10-CM | POA: Diagnosis not present

## 2013-09-27 DIAGNOSIS — M545 Low back pain, unspecified: Secondary | ICD-10-CM | POA: Diagnosis not present

## 2013-09-27 DIAGNOSIS — M5137 Other intervertebral disc degeneration, lumbosacral region: Secondary | ICD-10-CM | POA: Diagnosis not present

## 2013-09-27 DIAGNOSIS — R262 Difficulty in walking, not elsewhere classified: Secondary | ICD-10-CM | POA: Diagnosis not present

## 2013-09-27 DIAGNOSIS — M48061 Spinal stenosis, lumbar region without neurogenic claudication: Secondary | ICD-10-CM | POA: Diagnosis not present

## 2013-09-29 DIAGNOSIS — M545 Low back pain, unspecified: Secondary | ICD-10-CM | POA: Diagnosis not present

## 2013-09-29 DIAGNOSIS — R262 Difficulty in walking, not elsewhere classified: Secondary | ICD-10-CM | POA: Diagnosis not present

## 2013-09-29 DIAGNOSIS — M5137 Other intervertebral disc degeneration, lumbosacral region: Secondary | ICD-10-CM | POA: Diagnosis not present

## 2013-09-29 DIAGNOSIS — M48061 Spinal stenosis, lumbar region without neurogenic claudication: Secondary | ICD-10-CM | POA: Diagnosis not present

## 2013-10-04 DIAGNOSIS — M5137 Other intervertebral disc degeneration, lumbosacral region: Secondary | ICD-10-CM | POA: Diagnosis not present

## 2013-10-04 DIAGNOSIS — R262 Difficulty in walking, not elsewhere classified: Secondary | ICD-10-CM | POA: Diagnosis not present

## 2013-10-04 DIAGNOSIS — M48061 Spinal stenosis, lumbar region without neurogenic claudication: Secondary | ICD-10-CM | POA: Diagnosis not present

## 2013-10-04 DIAGNOSIS — M545 Low back pain, unspecified: Secondary | ICD-10-CM | POA: Diagnosis not present

## 2013-10-06 DIAGNOSIS — R262 Difficulty in walking, not elsewhere classified: Secondary | ICD-10-CM | POA: Diagnosis not present

## 2013-10-06 DIAGNOSIS — M48061 Spinal stenosis, lumbar region without neurogenic claudication: Secondary | ICD-10-CM | POA: Diagnosis not present

## 2013-10-06 DIAGNOSIS — M545 Low back pain, unspecified: Secondary | ICD-10-CM | POA: Diagnosis not present

## 2013-10-06 DIAGNOSIS — M5137 Other intervertebral disc degeneration, lumbosacral region: Secondary | ICD-10-CM | POA: Diagnosis not present

## 2013-10-11 DIAGNOSIS — M5137 Other intervertebral disc degeneration, lumbosacral region: Secondary | ICD-10-CM | POA: Diagnosis not present

## 2013-10-11 DIAGNOSIS — M545 Low back pain, unspecified: Secondary | ICD-10-CM | POA: Diagnosis not present

## 2013-10-11 DIAGNOSIS — R262 Difficulty in walking, not elsewhere classified: Secondary | ICD-10-CM | POA: Diagnosis not present

## 2013-10-11 DIAGNOSIS — M48061 Spinal stenosis, lumbar region without neurogenic claudication: Secondary | ICD-10-CM | POA: Diagnosis not present

## 2013-10-13 DIAGNOSIS — R262 Difficulty in walking, not elsewhere classified: Secondary | ICD-10-CM | POA: Diagnosis not present

## 2013-10-13 DIAGNOSIS — M48061 Spinal stenosis, lumbar region without neurogenic claudication: Secondary | ICD-10-CM | POA: Diagnosis not present

## 2013-10-13 DIAGNOSIS — M5137 Other intervertebral disc degeneration, lumbosacral region: Secondary | ICD-10-CM | POA: Diagnosis not present

## 2013-10-13 DIAGNOSIS — M545 Low back pain, unspecified: Secondary | ICD-10-CM | POA: Diagnosis not present

## 2013-10-14 DIAGNOSIS — M545 Low back pain, unspecified: Secondary | ICD-10-CM | POA: Diagnosis not present

## 2013-10-14 DIAGNOSIS — M48061 Spinal stenosis, lumbar region without neurogenic claudication: Secondary | ICD-10-CM | POA: Diagnosis not present

## 2013-10-14 DIAGNOSIS — R262 Difficulty in walking, not elsewhere classified: Secondary | ICD-10-CM | POA: Diagnosis not present

## 2013-10-14 DIAGNOSIS — M5137 Other intervertebral disc degeneration, lumbosacral region: Secondary | ICD-10-CM | POA: Diagnosis not present

## 2013-11-02 ENCOUNTER — Other Ambulatory Visit: Payer: Self-pay

## 2013-11-02 DIAGNOSIS — L739 Follicular disorder, unspecified: Secondary | ICD-10-CM | POA: Diagnosis not present

## 2013-11-02 DIAGNOSIS — Z85828 Personal history of other malignant neoplasm of skin: Secondary | ICD-10-CM | POA: Diagnosis not present

## 2013-11-02 DIAGNOSIS — L819 Disorder of pigmentation, unspecified: Secondary | ICD-10-CM | POA: Diagnosis not present

## 2013-11-02 DIAGNOSIS — L821 Other seborrheic keratosis: Secondary | ICD-10-CM | POA: Diagnosis not present

## 2013-11-02 DIAGNOSIS — D239 Other benign neoplasm of skin, unspecified: Secondary | ICD-10-CM | POA: Diagnosis not present

## 2013-11-02 DIAGNOSIS — L905 Scar conditions and fibrosis of skin: Secondary | ICD-10-CM | POA: Diagnosis not present

## 2013-12-07 DIAGNOSIS — Z01419 Encounter for gynecological examination (general) (routine) without abnormal findings: Secondary | ICD-10-CM | POA: Diagnosis not present

## 2013-12-07 DIAGNOSIS — IMO0002 Reserved for concepts with insufficient information to code with codable children: Secondary | ICD-10-CM | POA: Diagnosis not present

## 2013-12-09 ENCOUNTER — Other Ambulatory Visit: Payer: Self-pay

## 2013-12-09 ENCOUNTER — Encounter: Payer: Self-pay | Admitting: Internal Medicine

## 2013-12-09 DIAGNOSIS — Z1231 Encounter for screening mammogram for malignant neoplasm of breast: Secondary | ICD-10-CM

## 2013-12-13 ENCOUNTER — Telehealth: Payer: Self-pay | Admitting: Internal Medicine

## 2013-12-13 NOTE — Telephone Encounter (Signed)
Patient had colonoscopy on 12/25/2005(Cori) and was instructed to have a colonoscopy in 10 years. Patient notified.

## 2013-12-14 ENCOUNTER — Encounter: Payer: Self-pay | Admitting: Internal Medicine

## 2013-12-15 ENCOUNTER — Other Ambulatory Visit: Payer: Self-pay | Admitting: Urology

## 2013-12-15 DIAGNOSIS — N200 Calculus of kidney: Secondary | ICD-10-CM | POA: Diagnosis not present

## 2013-12-15 DIAGNOSIS — R3129 Other microscopic hematuria: Secondary | ICD-10-CM | POA: Diagnosis not present

## 2013-12-15 DIAGNOSIS — N201 Calculus of ureter: Secondary | ICD-10-CM | POA: Diagnosis not present

## 2013-12-15 DIAGNOSIS — R109 Unspecified abdominal pain: Secondary | ICD-10-CM | POA: Diagnosis not present

## 2013-12-19 ENCOUNTER — Other Ambulatory Visit: Payer: Self-pay

## 2013-12-19 MED ORDER — PRAVASTATIN SODIUM 40 MG PO TABS: ORAL_TABLET | ORAL | Status: DC

## 2013-12-20 ENCOUNTER — Other Ambulatory Visit: Payer: Self-pay

## 2013-12-20 MED ORDER — DILTIAZEM HCL 120 MG PO TABS: ORAL_TABLET | ORAL | Status: DC

## 2013-12-22 ENCOUNTER — Encounter (HOSPITAL_BASED_OUTPATIENT_CLINIC_OR_DEPARTMENT_OTHER): Payer: Self-pay | Admitting: *Deleted

## 2013-12-22 DIAGNOSIS — N200 Calculus of kidney: Secondary | ICD-10-CM | POA: Diagnosis not present

## 2013-12-22 NOTE — Progress Notes (Signed)
NPO AFTER MN. ARRIVE AT 0730.  NEEDS ISTAT. CURRENT EKG IN CHART AND EPIC.  WILL TAKE CARDIZEM AND ZYRTEC AM DOS W/ SIPS OF WATER AND IF NEEDED MAY TAKE PERCOCET.

## 2013-12-26 ENCOUNTER — Ambulatory Visit (HOSPITAL_BASED_OUTPATIENT_CLINIC_OR_DEPARTMENT_OTHER): Admission: RE | Admit: 2013-12-26 | Payer: Medicare Other | Source: Ambulatory Visit | Admitting: Urology

## 2013-12-26 HISTORY — DX: Presence of spectacles and contact lenses: Z97.3

## 2013-12-26 HISTORY — DX: Supraventricular tachycardia: I47.1

## 2013-12-26 HISTORY — DX: Other specified postprocedural states: R11.2

## 2013-12-26 HISTORY — DX: Supraventricular tachycardia, unspecified: I47.10

## 2013-12-26 HISTORY — DX: Personal history of other malignant neoplasm of skin: Z98.890

## 2013-12-26 HISTORY — DX: Spondylosis, unspecified: M47.9

## 2013-12-26 HISTORY — DX: Personal history of colon polyps, unspecified: Z86.0100

## 2013-12-26 HISTORY — DX: Urgency of urination: R39.15

## 2013-12-26 HISTORY — DX: Hyperlipidemia, unspecified: E78.5

## 2013-12-26 HISTORY — DX: Personal history of colonic polyps: Z86.010

## 2013-12-26 HISTORY — DX: Personal history of other malignant neoplasm of skin: Z85.828

## 2013-12-26 HISTORY — DX: Calculus of ureter: N20.1

## 2013-12-26 HISTORY — DX: Personal history of urinary calculi: Z87.442

## 2013-12-26 SURGERY — CYSTOURETEROSCOPY, WITH RETROGRADE PYELOGRAM AND STENT INSERTION
Anesthesia: General | Laterality: Right

## 2013-12-27 ENCOUNTER — Other Ambulatory Visit: Payer: Self-pay | Admitting: Urology

## 2013-12-27 DIAGNOSIS — R160 Hepatomegaly, not elsewhere classified: Secondary | ICD-10-CM

## 2014-01-02 ENCOUNTER — Other Ambulatory Visit (INDEPENDENT_AMBULATORY_CARE_PROVIDER_SITE_OTHER): Payer: Medicare Other

## 2014-01-02 ENCOUNTER — Encounter: Payer: Self-pay | Admitting: Internal Medicine

## 2014-01-02 ENCOUNTER — Ambulatory Visit (INDEPENDENT_AMBULATORY_CARE_PROVIDER_SITE_OTHER): Payer: Medicare Other | Admitting: Internal Medicine

## 2014-01-02 VITALS — BP 112/76 | HR 71 | Temp 97.9°F | Resp 14 | Ht 68.0 in | Wt 166.8 lb

## 2014-01-02 DIAGNOSIS — M899 Disorder of bone, unspecified: Secondary | ICD-10-CM

## 2014-01-02 DIAGNOSIS — Z87442 Personal history of urinary calculi: Secondary | ICD-10-CM | POA: Diagnosis not present

## 2014-01-02 DIAGNOSIS — M8580 Other specified disorders of bone density and structure, unspecified site: Secondary | ICD-10-CM

## 2014-01-02 DIAGNOSIS — E785 Hyperlipidemia, unspecified: Secondary | ICD-10-CM

## 2014-01-02 DIAGNOSIS — M949 Disorder of cartilage, unspecified: Secondary | ICD-10-CM

## 2014-01-02 DIAGNOSIS — R932 Abnormal findings on diagnostic imaging of liver and biliary tract: Secondary | ICD-10-CM

## 2014-01-02 DIAGNOSIS — Z860100 Personal history of colon polyps, unspecified: Secondary | ICD-10-CM

## 2014-01-02 LAB — HEPATIC FUNCTION PANEL
ALBUMIN: 4.5 g/dL (ref 3.5–5.2)
ALK PHOS: 74 U/L (ref 39–117)
ALT: 22 U/L (ref 0–35)
AST: 28 U/L (ref 0–37)
Bilirubin, Direct: 0.1 mg/dL (ref 0.0–0.3)
Total Bilirubin: 0.8 mg/dL (ref 0.2–1.2)
Total Protein: 7.9 g/dL (ref 6.0–8.3)

## 2014-01-02 LAB — CBC WITH DIFFERENTIAL/PLATELET
BASOS ABS: 0 10*3/uL (ref 0.0–0.1)
BASOS PCT: 0.5 % (ref 0.0–3.0)
Eosinophils Absolute: 0.2 10*3/uL (ref 0.0–0.7)
Eosinophils Relative: 2.7 % (ref 0.0–5.0)
HCT: 46.9 % — ABNORMAL HIGH (ref 36.0–46.0)
HEMOGLOBIN: 15.8 g/dL — AB (ref 12.0–15.0)
LYMPHS PCT: 41.9 % (ref 12.0–46.0)
Lymphs Abs: 3.7 10*3/uL (ref 0.7–4.0)
MCHC: 33.6 g/dL (ref 30.0–36.0)
MCV: 95.4 fl (ref 78.0–100.0)
Monocytes Absolute: 0.8 10*3/uL (ref 0.1–1.0)
Monocytes Relative: 9.4 % (ref 3.0–12.0)
NEUTROS ABS: 4 10*3/uL (ref 1.4–7.7)
Neutrophils Relative %: 45.5 % (ref 43.0–77.0)
Platelets: 240 10*3/uL (ref 150.0–400.0)
RBC: 4.92 Mil/uL (ref 3.87–5.11)
RDW: 13.5 % (ref 11.5–15.5)
WBC: 8.7 10*3/uL (ref 4.0–10.5)

## 2014-01-02 LAB — BASIC METABOLIC PANEL
BUN: 17 mg/dL (ref 6–23)
CALCIUM: 9.9 mg/dL (ref 8.4–10.5)
CO2: 28 mEq/L (ref 19–32)
Chloride: 105 mEq/L (ref 96–112)
Creatinine, Ser: 0.9 mg/dL (ref 0.4–1.2)
GFR: 65.04 mL/min (ref >60.00)
Glucose, Bld: 94 mg/dL (ref 70–99)
Potassium: 4.5 mEq/L (ref 3.5–5.1)
SODIUM: 141 meq/L (ref 135–145)

## 2014-01-02 LAB — LIPID PANEL
Cholesterol: 190 mg/dL (ref 0–200)
HDL: 76.1 mg/dL (ref >39.00)
LDL Cholesterol: 87 mg/dL (ref 0–99)
Total CHOL/HDL Ratio: 2
Triglycerides: 134 mg/dL (ref 0.0–149.0)
VLDL: 26.8 mg/dL (ref 0.0–40.0)

## 2014-01-02 LAB — TSH: TSH: 1.51 u[IU]/mL (ref 0.35–4.50)

## 2014-01-02 NOTE — Assessment & Plan Note (Signed)
CBC & dif 

## 2014-01-02 NOTE — Assessment & Plan Note (Signed)
Lipids, LFTs, TSH  

## 2014-01-02 NOTE — Assessment & Plan Note (Signed)
BMD Vit D level 

## 2014-01-02 NOTE — Progress Notes (Signed)
Pre visit review using our clinic review tool, if applicable. No additional management support is needed unless otherwise documented below in the visit note. 

## 2014-01-02 NOTE — Progress Notes (Signed)
   Subjective:    Patient ID: Sarah Valdez, female    DOB: 07-Feb-1946, 68 y.o.   MRN: [id number]  HPI  She is here to assess active health issues & conditions. PMH, FH, & Social history verified & updated .  A modified  heart healthy diet is followed; exercise encompasses  60 minutes 3  times per week as Pilates & elliptical without symptoms.  Family history is neg for premature coronary disease. Advanced cholesterol testing reveals  LDL goal is less than 110 ; ideally < 80 . There is medication compliance with the statin.  Low dose ASA taken   Review of Systems Specifically denied are  chest pain, palpitations, dyspnea, or claudication.  Significant abdominal symptoms, memory deficit, or myalgias not present. She has passed her 8 th renal calculus; the CT urogram revealed a "spot" on the liver. MRI is scheduled 01/03/14.        Objective:   Physical Exam Gen.: Healthy and well-nourished in appearance. Alert, appropriate and cooperative throughout exam. Appears younger than stated age  Head: Normocephalic without obvious abnormalities  Eyes: No corneal or conjunctival inflammation noted. Pupils equal round reactive to light and accommodation. Extraocular motion intact.  Nose: External nasal exam reveals no deformity or inflammation. Nasal mucosa are pink and moist. No lesions or exudates noted.   Mouth: Oral mucosa and oropharynx reveal no lesions or exudates. Teeth in good repair. Neck: No deformities, masses, or tenderness noted. Range of motion & Thyroid normal. Lungs: Normal respiratory effort; chest expands symmetrically. Lungs are clear to auscultation without rales, wheezes, or increased work of breathing. Heart: Normal rate and rhythm. Normal S1 and S2. No gallop, click, or rub. S4 w/o murmur. Abdomen: Bowel sounds normal; abdomen soft and nontender. No masses, organomegaly or hernias noted. Genitalia:  as per Gyn                                    Musculoskeletal/extremities: No deformity or scoliosis noted of  the thoracic or lumbar spine.  No clubbing, cyanosis, edema, or significant extremity  deformity noted. Range of motion normal .Tone & strength normal. Hand joints normal.  Fingernail health good. Able to lie down & sit up w/o help. Negative SLR bilaterally Vascular: Carotid, radial artery, dorsalis pedis and  posterior tibial pulses are full and equal. No bruits present. Neurologic: Alert and oriented x3. Deep tendon reflexes symmetrical and normal.  Gait normal . Skin: Intact without suspicious lesions or rashes. Lymph: No cervical, axillary lymphadenopathy present. Psych: Mood and affect are normal. Normally interactive                                                                                        Assessment & Plan:  See Current Assessment & Plan in Problem List under specific DiagnosisThe labs will be reviewed and risks and options assessed. Written recommendations will be provided by mail or directly through My Chart.Further evaluation or change in medical therapy will be directed by those results.

## 2014-01-02 NOTE — Patient Instructions (Signed)
Your next office appointment will be determined based upon review of your pending labs & BMDys. Those instructions will be transmitted to you through My Chart .

## 2014-01-03 ENCOUNTER — Ambulatory Visit (HOSPITAL_COMMUNITY)
Admission: RE | Admit: 2014-01-03 | Discharge: 2014-01-03 | Disposition: A | Discharge status: Home / Self Care | Payer: Medicare Other | Source: Ambulatory Visit | Attending: Urology | Admitting: Urology

## 2014-01-03 DIAGNOSIS — K7689 Other specified diseases of liver: Secondary | ICD-10-CM | POA: Diagnosis not present

## 2014-01-03 DIAGNOSIS — R160 Hepatomegaly, not elsewhere classified: Secondary | ICD-10-CM

## 2014-01-03 DIAGNOSIS — K8020 Calculus of gallbladder without cholecystitis without obstruction: Secondary | ICD-10-CM | POA: Diagnosis not present

## 2014-01-03 MED ORDER — GADOBENATE DIMEGLUMINE 529 MG/ML IV SOLN
15.0000 mL | Freq: Once | INTRAVENOUS | Status: AC | PRN
  Administered 2014-01-03: 15 mL via INTRAVENOUS

## 2014-01-04 ENCOUNTER — Encounter: Payer: Self-pay | Admitting: Internal Medicine

## 2014-01-05 ENCOUNTER — Ambulatory Visit
Admission: RE | Admit: 2014-01-05 | Discharge: 2014-01-05 | Disposition: A | Discharge status: Home / Self Care | Payer: Medicare Other | Source: Ambulatory Visit

## 2014-01-05 DIAGNOSIS — Z1231 Encounter for screening mammogram for malignant neoplasm of breast: Secondary | ICD-10-CM | POA: Diagnosis not present

## 2014-01-05 DIAGNOSIS — H2510 Age-related nuclear cataract, unspecified eye: Secondary | ICD-10-CM | POA: Diagnosis not present

## 2014-01-07 ENCOUNTER — Encounter: Payer: Self-pay | Admitting: Internal Medicine

## 2014-01-07 LAB — VITAMIN D 1,25 DIHYDROXY
Vitamin D 1, 25 (OH)2 Total: 85 pg/mL — ABNORMAL HIGH (ref 18–72)
Vitamin D2 1, 25 (OH)2: 35 pg/mL
Vitamin D3 1, 25 (OH)2: 50 pg/mL

## 2014-01-09 DIAGNOSIS — N200 Calculus of kidney: Secondary | ICD-10-CM | POA: Diagnosis not present

## 2014-01-20 ENCOUNTER — Encounter: Payer: Self-pay | Admitting: Internal Medicine

## 2014-01-20 ENCOUNTER — Ambulatory Visit (INDEPENDENT_AMBULATORY_CARE_PROVIDER_SITE_OTHER): Payer: Medicare Other | Admitting: Internal Medicine

## 2014-01-20 VITALS — BP 110/76 | HR 82 | Temp 98.0°F | Wt 168.4 lb

## 2014-01-20 DIAGNOSIS — K8020 Calculus of gallbladder without cholecystitis without obstruction: Secondary | ICD-10-CM

## 2014-01-20 DIAGNOSIS — K7689 Other specified diseases of liver: Secondary | ICD-10-CM

## 2014-01-20 DIAGNOSIS — K863 Pseudocyst of pancreas: Secondary | ICD-10-CM | POA: Diagnosis not present

## 2014-01-20 DIAGNOSIS — K862 Cyst of pancreas: Secondary | ICD-10-CM

## 2014-01-20 DIAGNOSIS — R932 Abnormal findings on diagnostic imaging of liver and biliary tract: Secondary | ICD-10-CM

## 2014-01-20 DIAGNOSIS — K120 Recurrent oral aphthae: Secondary | ICD-10-CM

## 2014-01-20 DIAGNOSIS — K760 Fatty (change of) liver, not elsewhere classified: Secondary | ICD-10-CM

## 2014-01-20 HISTORY — DX: Calculus of gallbladder without cholecystitis without obstruction: K80.20

## 2014-01-20 HISTORY — DX: Cyst of pancreas: K86.2

## 2014-01-20 MED ORDER — DOXYCYCLINE HYCLATE 100 MG PO TABS: 100.0000 mg | ORAL_TABLET | Freq: Two times a day (BID) | ORAL | Status: DC

## 2014-01-20 NOTE — Patient Instructions (Addendum)
To be cautious, MRI of the abdomen to evaluate the pancreatic cyst is recommended in 12 months.  If you have any right upper quadrant abdominal discomfort/pain; general surgery evaluation is recommended.  Avoid high fat and excess alcohol intake because of the hepatic steatosis as we discussed.

## 2014-01-20 NOTE — Progress Notes (Signed)
   Subjective:    Patient ID: Sarah Valdez, female    DOB: 04-Apr-1946, 68 y.o.   MRN: [id number]  HPI  She is here to discuss the incidental findings on her MRI 01/03/14. At  CT urogram a lesion was suggested of the right lobe of the liver. MRI documents this to be moderate hepatic steatosis of the right lobe. Her triglycerides were normal at 134.She does drink socially.  Other findings included an 8 x 11 cystic lesion in the pancreas. Nonmalignant etiology was suggested; but followup in 12 months was recommended  Most surprising was a presence of multiple gallstones, the largest was 1.7 cm in diameter. She is asymptomatic. There were no changes of cholecystitis or chronic inflammation.     Review of Systems  She denies any right upper quadrant or right scapular pain particularly after any dietary fat intake.  She denies any significant abdominal pain or unexplained weight loss.  She has several sublingual ulcers which she is treating with OTC Chloraseptic with some response.    Objective:   Physical Exam  General appearance is one of good health and nourishment w/o distress.  Eyes: No conjunctival inflammation or scleral icterus is present.  Oral exam: Dental hygiene is good; lips and gums are healthy appearing.There is no oropharyngeal erythema or exudate noted.   Heart:  Normal rate and regular rhythm. S1 and S2 normal without gallop, murmur, click, rub or other extra sounds     Lungs:Chest clear to auscultation; no wheezes, rhonchi,rales ,or rubs present.No increased work of breathing.   Abdomen: bowel sounds normal, soft and non-tender without masses, organomegaly or hernias noted.  No guarding or rebound . No tenderness over the flanks to percussion  Musculoskeletal: Able to lie flat and sit up without help. Negative straight leg raising bilaterally. Gait normal  Skin:Warm & dry.  Intact without suspicious lesions or rashes ; no jaundice or tenting  Lymphatic: No  lymphadenopathy is noted about the head, neck, axilla.                Assessment & Plan:  #1 several incidental findings on CT of urogram and MRI. Definitely the pancreatic cyst should be reassessed in 12 months.  She should avoid excess HFCS sugar &  alcohol because of the fatty liver. Should she have any right upper quadrant symptoms she should be seen electively by a general surgeon. The risk and options were discussed.  #2 aphthous ulcers  See orders

## 2014-01-20 NOTE — Progress Notes (Signed)
Pre visit review using our clinic review tool, if applicable. No additional management support is needed unless otherwise documented below in the visit note. 

## 2014-01-24 ENCOUNTER — Ambulatory Visit
Admission: RE | Admit: 2014-01-24 | Discharge: 2014-01-24 | Disposition: A | Discharge status: Home / Self Care | Payer: Medicare Other | Source: Ambulatory Visit | Attending: Internal Medicine | Admitting: Internal Medicine

## 2014-01-24 DIAGNOSIS — M949 Disorder of cartilage, unspecified: Secondary | ICD-10-CM | POA: Diagnosis not present

## 2014-01-24 DIAGNOSIS — M899 Disorder of bone, unspecified: Secondary | ICD-10-CM | POA: Diagnosis not present

## 2014-01-24 DIAGNOSIS — M8580 Other specified disorders of bone density and structure, unspecified site: Secondary | ICD-10-CM

## 2014-01-29 ENCOUNTER — Encounter: Payer: Self-pay | Admitting: Internal Medicine

## 2014-02-09 ENCOUNTER — Encounter: Payer: Self-pay | Admitting: Internal Medicine

## 2014-02-10 ENCOUNTER — Telehealth: Payer: Self-pay | Admitting: *Deleted

## 2014-02-10 NOTE — Telephone Encounter (Signed)
Pt left msg on triage stating she is not able to get into mychart. Called pt back no answer LMOM giving her they mychart support # [phone] to assit her with mychart...Johny Chess

## 2014-03-24 ENCOUNTER — Other Ambulatory Visit: Payer: Self-pay | Admitting: Internal Medicine

## 2014-04-19 ENCOUNTER — Encounter: Payer: Self-pay | Admitting: Internal Medicine

## 2014-04-19 ENCOUNTER — Ambulatory Visit (INDEPENDENT_AMBULATORY_CARE_PROVIDER_SITE_OTHER): Payer: Medicare Other | Admitting: Internal Medicine

## 2014-04-19 VITALS — BP 110/80 | HR 71 | Temp 98.1°F | Wt 169.5 lb

## 2014-04-19 DIAGNOSIS — M8580 Other specified disorders of bone density and structure, unspecified site: Secondary | ICD-10-CM

## 2014-04-19 DIAGNOSIS — M949 Disorder of cartilage, unspecified: Secondary | ICD-10-CM

## 2014-04-19 DIAGNOSIS — M899 Disorder of bone, unspecified: Secondary | ICD-10-CM

## 2014-04-19 NOTE — Progress Notes (Signed)
Pre visit review using our clinic review tool, if applicable. No additional management support is needed unless otherwise documented below in the visit note. 

## 2014-04-19 NOTE — Progress Notes (Signed)
   Subjective:    Patient ID: Sarah Valdez, female    DOB: 08-25-1946, 68 y.o.   MRN: [id number]  HPI  She is here to review her bone density results; these were actually completed in June.  The data and actual images were reviewed.  She does have significant osteopenia at the wrist with a T score of -2.3. This is not statistically significantly different from the -2.1 in 2013. There has been a 5.8% decrease in density at the femoral neck but the  T score value is -1.8.  Despite these findings her major osteoporotic risk is 11% and hip fracture risk is 1.7%, both low  Her vitamin D level was superb at 85 on 01/02/14.  Her sister does have osteoporosis but this is in the setting of chemotherapy for breast cancer.  Pilates and walking are completed at least 3 days a week for at least 30-60 minutes.    Review of SystemsNo active musculoskeletal symptoms.     Objective:   Physical Exam   There is some decreased range of motion of cervical spine, particularly laterally. Spinal alignment is excellent. She has no significant degenerative changes in hands She has postoperative changes of the right knee from total knee replacement. Strength, tone, deep tendon reflexes are normal except for the right knee.         Assessment & Plan:#1 significant osteopenia of the wrist, stable  #2 excellent vitamin D level  #3 excellent prophylactic exercise program  Plan: Recheck the bone density and 25 months  Monitor her vitamin D level annually.  Continue present prophylactic program.

## 2014-04-19 NOTE — Patient Instructions (Signed)
Normal T scores on a bone density exam (BMD)  are +1 to -1. Osteopenia would be -1.1 to -2.4. Osteoporosis is defined by a  T score worse than -2.4. Treatment should be considered  with  T scores worse than -1.5, particularly if there is  family history of low bone density or personal  past history of atypical fractue.BMD should be monitored every 25 months. Recommended lifestyle interventions to prevent  Osteoporosis include calcium 600 mg twice a day  & vitamin D3 supplementation to keep vitamin  D  level @ least 40-60. The usual vitamin D3 dose is 1000 IU daily; but individual dose is determined by annual vitamin D level monitor. Also weight bearing exercise such as  walking 30-45 minutes 3-4  X per week is recommended. No treatment unless T score is lower than -2.5 @ wrist.

## 2014-07-07 ENCOUNTER — Ambulatory Visit (INDEPENDENT_AMBULATORY_CARE_PROVIDER_SITE_OTHER): Payer: Medicare Other | Admitting: *Deleted

## 2014-07-07 DIAGNOSIS — Z23 Encounter for immunization: Secondary | ICD-10-CM

## 2014-07-13 DIAGNOSIS — N200 Calculus of kidney: Secondary | ICD-10-CM | POA: Diagnosis not present

## 2014-10-17 ENCOUNTER — Encounter: Payer: Self-pay | Admitting: Internal Medicine

## 2014-10-17 ENCOUNTER — Ambulatory Visit (INDEPENDENT_AMBULATORY_CARE_PROVIDER_SITE_OTHER): Payer: Medicare Other | Admitting: Internal Medicine

## 2014-10-17 VITALS — BP 132/88 | HR 63 | Temp 98.1°F | Ht 68.0 in | Wt 172.2 lb

## 2014-10-17 DIAGNOSIS — Z8249 Family history of ischemic heart disease and other diseases of the circulatory system: Secondary | ICD-10-CM

## 2014-10-17 DIAGNOSIS — M79602 Pain in left arm: Secondary | ICD-10-CM | POA: Diagnosis not present

## 2014-10-17 DIAGNOSIS — R9431 Abnormal electrocardiogram [ECG] [EKG]: Secondary | ICD-10-CM | POA: Diagnosis not present

## 2014-10-17 NOTE — Progress Notes (Signed)
Pre visit review using our clinic review tool, if applicable. No additional management support is needed unless otherwise documented below in the visit note. 

## 2014-10-17 NOTE — Patient Instructions (Signed)
   The stress test will be scheduled; call if you have not received confirmation within 10 days.  If you have the arm pain; please come to the lab for acute lab studies.

## 2014-10-17 NOTE — Progress Notes (Signed)
   Subjective:    Patient ID: Sarah Valdez, female    DOB: August 16, 1946, 69 y.o.   MRN: [id number]  HPI For the last several weeks she's had intermittent pain in the left biceps area described dull-sharp, up to level III-VIII. It occurs 2-3 times a week & can last minutes to hours. It is nonexertional and has not occurred with her Pilates class or on the elliptical. Also lifting does not aggravate it.  Her last episode was yesterday; she's had no discomfort in the left upper extremity today.  She's had some nausea and sweating with housework but this is unrelated to the left upper extremity discomfort    She also had a "cold" last week w/o residual URI symptoms.  She has no other cardiopulmonary symptoms except for occasional intermittent edema.  Her concern is that her mother had a myocardial infarction.  Review of Systems  Chest pain, palpitations, tachycardia, exertional dyspnea, paroxysmal nocturnal dyspnea, or claudication are absent.  Frontal headache, facial pain , nasal purulence, dental pain, sore throat , otic pain or otic discharge denied. No fever , chills or sweats.     Objective:   Physical Exam  Pertinent or positive findings include: She has slight crepitus with rotation of the left shoulder There is intermittent left upper extremity discomfort to opposition with arm extended laterally 90 degrees; but this is not reproducible.  Appears healthy and well-nourished & in no acute distress No carotid bruits are present.No neck vein distention present at 10 - 15 degrees. Thyroid normal to palpation Heart rhythm and rate are normal with no gallop or murmur Chest is clear with no increased work of breathing There is no evidence of aortic aneurysm or renal artery bruits Abdomen soft with no organomegaly or masses. No HJR No clubbing, cyanosis or edema present. Pedal pulses are intact  No ischemic skin changes are present . Fingernails healthy  Alert and oriented.  Strength, tone, DTRs reflexes normal      Assessment & Plan:  #1 left upper arm pain; tendinitis or musculoskeletal etiology is suggested but there is a family history of coronary disease in her mother.  #2 Non specific  EKG changes. This revealed poor R-wave progression across the precordium without ischemic ST-T changes.  Stress test will be pursued.  She is to come in for the troponin 1 should she have the acute left upper arm pain.

## 2014-11-29 ENCOUNTER — Encounter: Payer: Self-pay | Admitting: Internal Medicine

## 2014-12-01 ENCOUNTER — Ambulatory Visit (INDEPENDENT_AMBULATORY_CARE_PROVIDER_SITE_OTHER): Payer: Medicare Other | Admitting: Internal Medicine

## 2014-12-01 ENCOUNTER — Encounter: Payer: Self-pay | Admitting: Internal Medicine

## 2014-12-01 VITALS — BP 114/84 | HR 66 | Temp 97.7°F | Ht 68.0 in | Wt 164.2 lb

## 2014-12-01 DIAGNOSIS — K862 Cyst of pancreas: Secondary | ICD-10-CM | POA: Diagnosis not present

## 2014-12-01 DIAGNOSIS — K8020 Calculus of gallbladder without cholecystitis without obstruction: Secondary | ICD-10-CM | POA: Diagnosis not present

## 2014-12-01 DIAGNOSIS — K760 Fatty (change of) liver, not elsewhere classified: Secondary | ICD-10-CM

## 2014-12-01 NOTE — Progress Notes (Signed)
   Subjective:    Patient ID: Sarah Valdez, female    DOB: Apr 14, 1946, 69 y.o.   MRN: [id number]  HPI She is here in follow-up of the incidentally noted pancreatic cyst along the pancreatic body on MRI.This was done 01/03/2014 to evaluate renal calculi by her urologist. As per the standard of care repeat MRI of the abdomen with and without contrast was recommended in 12 months. Other findings on that MRI included severe hepatic steatosis within the right hepatic lobe and multiple gallstones measuring up to 1.7 cm.  She previously had some symptoms which were worrisome for subclinical coronary disease. A stress test was to have been scheduled but inadvertently was not completed. At this time she has no active symptoms and is on an elliptical 3 times a week for 30 minutes without symptoms.  She has no active cardiac, GI, or genitourinary symptoms at this time.  Review of Systems Unexplained weight loss, abdominal pain, significant dyspepsia, dysphagia, melena, rectal bleeding, or persistently small caliber stools are denied.  Chest pain, palpitations, tachycardia, exertional dyspnea, paroxysmal nocturnal dyspnea, claudication or edema are absent.       Objective:   Physical Exam  General appearance is one of good health and nourishment w/o distress.  Eyes: No conjunctival inflammation or scleral icterus is present.  Oral exam: Dental hygiene is good; lips and gums are healthy appearing.There is no oropharyngeal erythema or exudate noted.   Heart:  Normal rate and regular rhythm. S1 and S2 normal without gallop, murmur, click, rub or other extra sounds     Lungs:Chest clear to auscultation; no wheezes, rhonchi,rales ,or rubs present.No increased work of breathing.   Abdomen: bowel sounds normal, soft and non-tender without masses, organomegaly or hernias noted.  No guarding or rebound .   Musculoskeletal: Able to lie flat and sit up without help. She has mild crepitus of the knees  bilaterally. Negative straight leg raising bilaterally. Gait normal  Skin:Warm & dry.  Intact without suspicious lesions or rashes ; no jaundice or tenting  Lymphatic: No lymphadenopathy is noted about the head, neck, axilla.        Assessment & Plan:  #1 pancreatic cyst  #2 severe hepatic steatosis  #3 gallstones, asymptomatic  #4 possible subclinical anginal symptoms resolved  Plan: Repeat MRI as per standard of care.  Rescheduled stress test will be canceled.

## 2014-12-01 NOTE — Patient Instructions (Signed)
The MRI referral will be scheduled and you'll be notified of the time.Please call the Referral Co-Ordinator @ [phone] if you have not been notified of appointment time within 7-10 days.

## 2014-12-01 NOTE — Progress Notes (Signed)
Pre visit review using our clinic review tool, if applicable. No additional management support is needed unless otherwise documented below in the visit note. 

## 2015-01-02 ENCOUNTER — Other Ambulatory Visit: Payer: Self-pay

## 2015-01-02 DIAGNOSIS — Z1231 Encounter for screening mammogram for malignant neoplasm of breast: Secondary | ICD-10-CM

## 2015-01-04 DIAGNOSIS — L570 Actinic keratosis: Secondary | ICD-10-CM | POA: Diagnosis not present

## 2015-01-04 DIAGNOSIS — L814 Other melanin hyperpigmentation: Secondary | ICD-10-CM | POA: Diagnosis not present

## 2015-01-04 DIAGNOSIS — L738 Other specified follicular disorders: Secondary | ICD-10-CM | POA: Diagnosis not present

## 2015-01-04 DIAGNOSIS — L821 Other seborrheic keratosis: Secondary | ICD-10-CM | POA: Diagnosis not present

## 2015-01-04 DIAGNOSIS — B078 Other viral warts: Secondary | ICD-10-CM | POA: Diagnosis not present

## 2015-01-08 ENCOUNTER — Inpatient Hospital Stay: Admission: RE | Admit: 2015-01-08 | Payer: Medicare Other | Source: Ambulatory Visit

## 2015-01-09 ENCOUNTER — Ambulatory Visit: Payer: Medicare Other

## 2015-01-11 ENCOUNTER — Encounter: Payer: Medicare Other | Admitting: Physician Assistant

## 2015-01-17 ENCOUNTER — Ambulatory Visit: Payer: Medicare Other

## 2015-01-17 ENCOUNTER — Ambulatory Visit
Admission: RE | Admit: 2015-01-17 | Discharge: 2015-01-17 | Disposition: A | Discharge status: Home / Self Care | Payer: Medicare Other | Source: Ambulatory Visit | Attending: Internal Medicine | Admitting: Internal Medicine

## 2015-01-17 DIAGNOSIS — K8020 Calculus of gallbladder without cholecystitis without obstruction: Secondary | ICD-10-CM | POA: Diagnosis not present

## 2015-01-17 DIAGNOSIS — K862 Cyst of pancreas: Secondary | ICD-10-CM

## 2015-01-17 DIAGNOSIS — K760 Fatty (change of) liver, not elsewhere classified: Secondary | ICD-10-CM | POA: Diagnosis not present

## 2015-01-17 MED ORDER — GADOBENATE DIMEGLUMINE 529 MG/ML IV SOLN
15.0000 mL | Freq: Once | INTRAVENOUS | Status: AC | PRN
  Administered 2015-01-17: 15 mL via INTRAVENOUS

## 2015-02-12 ENCOUNTER — Other Ambulatory Visit: Payer: Self-pay | Admitting: Internal Medicine

## 2015-02-12 ENCOUNTER — Ambulatory Visit
Admission: RE | Admit: 2015-02-12 | Discharge: 2015-02-12 | Disposition: A | Discharge status: Home / Self Care | Payer: Medicare Other | Source: Ambulatory Visit

## 2015-02-12 DIAGNOSIS — Z1231 Encounter for screening mammogram for malignant neoplasm of breast: Secondary | ICD-10-CM | POA: Diagnosis not present

## 2015-02-13 DIAGNOSIS — H3531 Nonexudative age-related macular degeneration: Secondary | ICD-10-CM | POA: Diagnosis not present

## 2015-02-13 DIAGNOSIS — H2513 Age-related nuclear cataract, bilateral: Secondary | ICD-10-CM | POA: Diagnosis not present

## 2015-02-13 DIAGNOSIS — H524 Presbyopia: Secondary | ICD-10-CM | POA: Diagnosis not present

## 2015-03-29 DIAGNOSIS — M5489 Other dorsalgia: Secondary | ICD-10-CM | POA: Diagnosis not present

## 2015-03-29 DIAGNOSIS — Z87442 Personal history of urinary calculi: Secondary | ICD-10-CM | POA: Diagnosis not present

## 2015-03-29 DIAGNOSIS — N200 Calculus of kidney: Secondary | ICD-10-CM | POA: Diagnosis not present

## 2015-05-01 DIAGNOSIS — N200 Calculus of kidney: Secondary | ICD-10-CM | POA: Diagnosis not present

## 2015-05-01 DIAGNOSIS — R1012 Left upper quadrant pain: Secondary | ICD-10-CM | POA: Diagnosis not present

## 2015-06-08 ENCOUNTER — Telehealth: Payer: Self-pay

## 2015-06-08 NOTE — Telephone Encounter (Signed)
Call to discuss AWV and stated that spouse was in [place] and will have to reach him separately. Stated that she needs CPE with Linna Darner and scheduled for 11/9 at 1:30 pm with hopper and AWV post visit if Linna Darner does not complete

## 2015-07-04 ENCOUNTER — Encounter: Payer: Self-pay | Admitting: Internal Medicine

## 2015-07-04 ENCOUNTER — Ambulatory Visit (INDEPENDENT_AMBULATORY_CARE_PROVIDER_SITE_OTHER): Payer: Medicare Other | Admitting: Internal Medicine

## 2015-07-04 VITALS — BP 112/78 | HR 67 | Temp 98.4°F | Resp 14 | Ht 68.0 in | Wt 174.0 lb

## 2015-07-04 DIAGNOSIS — Z860100 Personal history of colon polyps, unspecified: Secondary | ICD-10-CM

## 2015-07-04 DIAGNOSIS — Z23 Encounter for immunization: Secondary | ICD-10-CM | POA: Diagnosis not present

## 2015-07-04 DIAGNOSIS — E559 Vitamin D deficiency, unspecified: Secondary | ICD-10-CM | POA: Diagnosis not present

## 2015-07-04 DIAGNOSIS — M8580 Other specified disorders of bone density and structure, unspecified site: Secondary | ICD-10-CM

## 2015-07-04 DIAGNOSIS — E785 Hyperlipidemia, unspecified: Secondary | ICD-10-CM | POA: Diagnosis not present

## 2015-07-04 NOTE — Patient Instructions (Signed)
Cardiovascular exercise, this can be as simple a program as walking, is recommended 30-45 minutes 3-4 times per week. If you're not exercising you should take 6-8 weeks to build up to this level. The best exercises for the low back include freestyle swimming, stretch aerobics, and yoga.Cybex & Nautilus machines rather than dead weights are better for the back.

## 2015-07-04 NOTE — Assessment & Plan Note (Signed)
CBC

## 2015-07-04 NOTE — Assessment & Plan Note (Signed)
Lipids, LFTs, TSH  

## 2015-07-04 NOTE — Progress Notes (Signed)
Pre visit review using our clinic review tool, if applicable. No additional management support is needed unless otherwise documented below in the visit note. 

## 2015-07-04 NOTE — Progress Notes (Signed)
   Subjective:    Patient ID: Sarah Valdez, female    DOB: Sep 05, 1945, 69 y.o.   MRN: [id number]  HPI The patient is here to assess status of active health conditions.  PMH, FH, & Social History reviewed & updated.No change in [place] as recorded.  She has been compliant with her medications without adverse effects. She's on a heart healthy diet but does eat some salt. She's not exercised regularly  for several months.  She intermittently has some arm discomfort below the left shoulder anteriorly. This is usually after lifting but can occur without trigger.  She also has intermittent edema.  She did have an isolated episode of rash around both ankles which was limited to 48 hours. It was nonpruritic and resolved without treatment.  She has chronic low back pain which is not active at this time. It is not associated with neuromuscular deficits.  She does have frequency as well as nocturia 2-3 times per night. She is due for gynecologic follow-up.  Colonoscopy was performed 2007; she is due next year. She has no active GI symptoms.   Most recent labs on record were 01/02/14. HDL was 76.1 and LDL 87. Hematocrit was 46.9 and vitamin D level 85. All other labs were at goal or  therapeutic.  Review of Systems  Chest pain, palpitations, tachycardia, exertional dyspnea, paroxysmal nocturnal dyspnea, or claudication are absent. No unexplained weight loss, abdominal pain, significant dyspepsia, dysphagia, melena, rectal bleeding, or persistently small caliber stools. Dysuria, pyuria, hematuria, or polyuria are denied. Change in hair, skin, nails denied. No bowel changes of constipation or diarrhea. No intolerance to heat or cold. She denies numbness, tingling, or weakness in the extremities. She has no incontinence of urine or stool.    Objective:   Physical Exam  Pertinent or positive findings include: She has decreased range of motion of the cervical spine. She has slight decreased hearing on  the left. Minor crepitus noted in the knees. Pedal pulses are slightly decreased. She has trace-1/2+ edema.   General appearance :adequately nourished; in no distress.  Eyes: No conjunctival inflammation or scleral icterus is present.  Oral exam:  Lips and gums are healthy appearing.There is no oropharyngeal erythema or exudate noted. Dental hygiene is good.  Heart:  Normal rate and regular rhythm. S1 and S2 normal without gallop, murmur, click, rub or other extra sounds    Lungs:Chest clear to auscultation; no wheezes, rhonchi,rales ,or rubs present.No increased work of breathing.   Abdomen: bowel sounds normal, soft and non-tender without masses, organomegaly or hernias noted.  No guarding or rebound.   Vascular : all pulses equal ; no bruits present.  Skin:Warm & dry.  Intact without suspicious lesions or rashes ; no tenting or jaundice   Lymphatic: No lymphadenopathy is noted about the head, neck, axilla.   Neuro: Strength, tone & DTRs normal.      Assessment & Plan:  See Current Assessment & Plan in Problem List under specific Diagnosis

## 2015-07-04 NOTE — Assessment & Plan Note (Signed)
Vitamin D level 

## 2015-07-05 ENCOUNTER — Other Ambulatory Visit (INDEPENDENT_AMBULATORY_CARE_PROVIDER_SITE_OTHER): Payer: Medicare Other

## 2015-07-05 DIAGNOSIS — E559 Vitamin D deficiency, unspecified: Secondary | ICD-10-CM

## 2015-07-05 DIAGNOSIS — Z86010 Personal history of colonic polyps: Secondary | ICD-10-CM | POA: Diagnosis not present

## 2015-07-05 DIAGNOSIS — E785 Hyperlipidemia, unspecified: Secondary | ICD-10-CM | POA: Diagnosis not present

## 2015-07-05 DIAGNOSIS — M8580 Other specified disorders of bone density and structure, unspecified site: Secondary | ICD-10-CM | POA: Diagnosis not present

## 2015-07-05 LAB — HEPATIC FUNCTION PANEL
ALK PHOS: 71 U/L (ref 39–117)
ALT: 18 U/L (ref 0–35)
AST: 21 U/L (ref 0–37)
Albumin: 4.3 g/dL (ref 3.5–5.2)
BILIRUBIN TOTAL: 0.7 mg/dL (ref 0.2–1.2)
Bilirubin, Direct: 0.1 mg/dL (ref 0.0–0.3)
Total Protein: 6.9 g/dL (ref 6.0–8.3)

## 2015-07-05 LAB — VITAMIN D 25 HYDROXY (VIT D DEFICIENCY, FRACTURES): VITD: 21.18 ng/mL — ABNORMAL LOW (ref 30.00–100.00)

## 2015-07-05 LAB — CBC WITH DIFFERENTIAL/PLATELET
BASOS ABS: 0 10*3/uL (ref 0.0–0.1)
BASOS PCT: 0.6 % (ref 0.0–3.0)
Eosinophils Absolute: 0.2 10*3/uL (ref 0.0–0.7)
Eosinophils Relative: 3.2 % (ref 0.0–5.0)
HCT: 44.2 % (ref 36.0–46.0)
Hemoglobin: 14.9 g/dL (ref 12.0–15.0)
LYMPHS ABS: 2.1 10*3/uL (ref 0.7–4.0)
Lymphocytes Relative: 32.8 % (ref 12.0–46.0)
MCHC: 33.6 g/dL (ref 30.0–36.0)
MCV: 94.2 fl (ref 78.0–100.0)
MONO ABS: 0.6 10*3/uL (ref 0.1–1.0)
Monocytes Relative: 8.7 % (ref 3.0–12.0)
NEUTROS PCT: 54.7 % (ref 43.0–77.0)
Neutro Abs: 3.5 10*3/uL (ref 1.4–7.7)
Platelets: 222 10*3/uL (ref 150.0–400.0)
RBC: 4.69 Mil/uL (ref 3.87–5.11)
RDW: 13.8 % (ref 11.5–15.5)
WBC: 6.3 10*3/uL (ref 4.0–10.5)

## 2015-07-05 LAB — BASIC METABOLIC PANEL
BUN: 19 mg/dL (ref 6–23)
CO2: 24 mEq/L (ref 19–32)
CREATININE: 0.85 mg/dL (ref 0.40–1.20)
Calcium: 9.3 mg/dL (ref 8.4–10.5)
Chloride: 108 mEq/L (ref 96–112)
GFR: 70.06 mL/min (ref >60.00)
GLUCOSE: 97 mg/dL (ref 70–99)
Potassium: 4.2 mEq/L (ref 3.5–5.1)
Sodium: 141 mEq/L (ref 135–145)

## 2015-07-05 LAB — LIPID PANEL
Cholesterol: 184 mg/dL (ref 0–200)
HDL: 71.8 mg/dL (ref >39.00)
LDL CALC: 96 mg/dL (ref 0–99)
NONHDL: 112.1
Total CHOL/HDL Ratio: 3
Triglycerides: 80 mg/dL (ref 0.0–149.0)
VLDL: 16 mg/dL (ref 0.0–40.0)

## 2015-07-05 LAB — TSH: TSH: 1.37 u[IU]/mL (ref 0.35–4.50)

## 2015-07-10 DIAGNOSIS — L821 Other seborrheic keratosis: Secondary | ICD-10-CM | POA: Diagnosis not present

## 2015-07-25 ENCOUNTER — Encounter: Payer: Self-pay | Admitting: Internal Medicine

## 2015-10-15 DIAGNOSIS — N200 Calculus of kidney: Secondary | ICD-10-CM | POA: Diagnosis not present

## 2015-10-31 ENCOUNTER — Telehealth: Payer: Self-pay | Admitting: Internal Medicine

## 2015-10-31 MED ORDER — PRAVASTATIN SODIUM 40 MG PO TABS: 40.0000 mg | ORAL_TABLET | Freq: Every day | ORAL | Status: DC

## 2015-10-31 MED ORDER — DILTIAZEM HCL 120 MG PO TABS: ORAL_TABLET | ORAL | Status: DC

## 2015-10-31 NOTE — Telephone Encounter (Signed)
Pt requesting refills for diltiazem (CARDIZEM) 120 MG tablet [id number] and pravastatin (PRAVACHOL) 40 MG tablet [id number] Pharmacy is [facility]  I did get her scheduled for her transfer appt in May

## 2015-12-18 ENCOUNTER — Ambulatory Visit: Payer: Medicare Other | Admitting: Internal Medicine

## 2015-12-20 ENCOUNTER — Encounter: Payer: Self-pay | Admitting: Gastroenterology

## 2015-12-28 ENCOUNTER — Encounter: Payer: Self-pay | Admitting: Gastroenterology

## 2016-01-01 ENCOUNTER — Ambulatory Visit: Payer: Medicare Other | Admitting: Internal Medicine

## 2016-01-07 DIAGNOSIS — Z124 Encounter for screening for malignant neoplasm of cervix: Secondary | ICD-10-CM | POA: Diagnosis not present

## 2016-01-07 DIAGNOSIS — Z6826 Body mass index (BMI) 26.0-26.9, adult: Secondary | ICD-10-CM | POA: Diagnosis not present

## 2016-01-08 ENCOUNTER — Other Ambulatory Visit: Payer: Self-pay | Admitting: Internal Medicine

## 2016-01-09 DIAGNOSIS — L821 Other seborrheic keratosis: Secondary | ICD-10-CM | POA: Diagnosis not present

## 2016-01-09 DIAGNOSIS — D692 Other nonthrombocytopenic purpura: Secondary | ICD-10-CM | POA: Diagnosis not present

## 2016-01-09 DIAGNOSIS — D225 Melanocytic nevi of trunk: Secondary | ICD-10-CM | POA: Diagnosis not present

## 2016-01-09 DIAGNOSIS — Z85828 Personal history of other malignant neoplasm of skin: Secondary | ICD-10-CM | POA: Diagnosis not present

## 2016-01-09 DIAGNOSIS — L814 Other melanin hyperpigmentation: Secondary | ICD-10-CM | POA: Diagnosis not present

## 2016-01-09 DIAGNOSIS — L738 Other specified follicular disorders: Secondary | ICD-10-CM | POA: Diagnosis not present

## 2016-01-10 DIAGNOSIS — K635 Polyp of colon: Secondary | ICD-10-CM | POA: Diagnosis not present

## 2016-01-10 DIAGNOSIS — Z6826 Body mass index (BMI) 26.0-26.9, adult: Secondary | ICD-10-CM | POA: Diagnosis not present

## 2016-01-10 DIAGNOSIS — M545 Low back pain: Secondary | ICD-10-CM | POA: Diagnosis not present

## 2016-01-10 DIAGNOSIS — Z1389 Encounter for screening for other disorder: Secondary | ICD-10-CM | POA: Diagnosis not present

## 2016-01-10 DIAGNOSIS — K760 Fatty (change of) liver, not elsewhere classified: Secondary | ICD-10-CM | POA: Diagnosis not present

## 2016-01-10 DIAGNOSIS — N200 Calculus of kidney: Secondary | ICD-10-CM | POA: Diagnosis not present

## 2016-01-10 DIAGNOSIS — I878 Other specified disorders of veins: Secondary | ICD-10-CM | POA: Diagnosis not present

## 2016-01-10 DIAGNOSIS — M859 Disorder of bone density and structure, unspecified: Secondary | ICD-10-CM | POA: Diagnosis not present

## 2016-01-10 DIAGNOSIS — I8390 Asymptomatic varicose veins of unspecified lower extremity: Secondary | ICD-10-CM | POA: Diagnosis not present

## 2016-01-10 DIAGNOSIS — R600 Localized edema: Secondary | ICD-10-CM | POA: Diagnosis not present

## 2016-01-10 DIAGNOSIS — E784 Other hyperlipidemia: Secondary | ICD-10-CM | POA: Diagnosis not present

## 2016-01-10 DIAGNOSIS — R351 Nocturia: Secondary | ICD-10-CM | POA: Diagnosis not present

## 2016-01-16 DIAGNOSIS — H43813 Vitreous degeneration, bilateral: Secondary | ICD-10-CM | POA: Diagnosis not present

## 2016-01-16 DIAGNOSIS — H524 Presbyopia: Secondary | ICD-10-CM | POA: Diagnosis not present

## 2016-01-16 DIAGNOSIS — H353122 Nonexudative age-related macular degeneration, left eye, intermediate dry stage: Secondary | ICD-10-CM | POA: Diagnosis not present

## 2016-01-16 DIAGNOSIS — H04122 Dry eye syndrome of left lacrimal gland: Secondary | ICD-10-CM | POA: Diagnosis not present

## 2016-01-16 DIAGNOSIS — H25813 Combined forms of age-related cataract, bilateral: Secondary | ICD-10-CM | POA: Diagnosis not present

## 2016-01-17 ENCOUNTER — Other Ambulatory Visit: Payer: Self-pay

## 2016-01-17 DIAGNOSIS — Z1231 Encounter for screening mammogram for malignant neoplasm of breast: Secondary | ICD-10-CM

## 2016-02-29 ENCOUNTER — Ambulatory Visit: Payer: Medicare Other

## 2016-03-14 ENCOUNTER — Encounter: Payer: Medicare Other | Admitting: Gastroenterology

## 2016-05-02 DIAGNOSIS — Z6826 Body mass index (BMI) 26.0-26.9, adult: Secondary | ICD-10-CM | POA: Diagnosis not present

## 2016-05-02 DIAGNOSIS — H8110 Benign paroxysmal vertigo, unspecified ear: Secondary | ICD-10-CM | POA: Diagnosis not present

## 2016-05-02 DIAGNOSIS — J302 Other seasonal allergic rhinitis: Secondary | ICD-10-CM | POA: Diagnosis not present

## 2016-05-02 DIAGNOSIS — R002 Palpitations: Secondary | ICD-10-CM | POA: Diagnosis not present

## 2016-05-23 ENCOUNTER — Encounter: Payer: Self-pay | Admitting: Gastroenterology

## 2016-05-23 DIAGNOSIS — M19011 Primary osteoarthritis, right shoulder: Secondary | ICD-10-CM | POA: Diagnosis not present

## 2016-05-23 DIAGNOSIS — S46011A Strain of muscle(s) and tendon(s) of the rotator cuff of right shoulder, initial encounter: Secondary | ICD-10-CM | POA: Diagnosis not present

## 2016-05-26 ENCOUNTER — Ambulatory Visit: Payer: Medicare Other | Admitting: *Deleted

## 2016-05-26 VITALS — Ht 66.0 in | Wt 171.2 lb

## 2016-05-26 DIAGNOSIS — Z86010 Personal history of colonic polyps: Secondary | ICD-10-CM

## 2016-05-26 MED ORDER — SUPREP BOWEL PREP KIT 17.5-3.13-1.6 GM/177ML PO SOLN: 1.0000 | Freq: Once | ORAL | 0 refills | Status: AC

## 2016-05-26 NOTE — Progress Notes (Signed)
Patient denies any allergies to egg or soy products. Patient has hx of PONV with anesthesia/sedation.  Patient denies oxygen use at home and denies diet medications. Emmi instructions for colonoscopy  explained but patient denied.  Pamphlet.

## 2016-06-03 ENCOUNTER — Ambulatory Visit
Admission: RE | Admit: 2016-06-03 | Discharge: 2016-06-03 | Disposition: A | Discharge status: Home / Self Care | Payer: Medicare Other | Source: Ambulatory Visit

## 2016-06-03 DIAGNOSIS — Z1231 Encounter for screening mammogram for malignant neoplasm of breast: Secondary | ICD-10-CM

## 2016-06-04 DIAGNOSIS — S46011D Strain of muscle(s) and tendon(s) of the rotator cuff of right shoulder, subsequent encounter: Secondary | ICD-10-CM | POA: Diagnosis not present

## 2016-06-09 DIAGNOSIS — S46011D Strain of muscle(s) and tendon(s) of the rotator cuff of right shoulder, subsequent encounter: Secondary | ICD-10-CM | POA: Diagnosis not present

## 2016-06-11 DIAGNOSIS — S46011D Strain of muscle(s) and tendon(s) of the rotator cuff of right shoulder, subsequent encounter: Secondary | ICD-10-CM | POA: Diagnosis not present

## 2016-06-16 DIAGNOSIS — S46011D Strain of muscle(s) and tendon(s) of the rotator cuff of right shoulder, subsequent encounter: Secondary | ICD-10-CM | POA: Diagnosis not present

## 2016-06-18 ENCOUNTER — Ambulatory Visit (AMBULATORY_SURGERY_CENTER): Payer: Medicare Other | Admitting: Gastroenterology

## 2016-06-18 ENCOUNTER — Encounter: Payer: Self-pay | Admitting: Gastroenterology

## 2016-06-18 VITALS — BP 122/67 | HR 61 | Temp 96.4°F | Resp 14 | Ht 66.0 in | Wt 171.0 lb

## 2016-06-18 DIAGNOSIS — Z1212 Encounter for screening for malignant neoplasm of rectum: Secondary | ICD-10-CM | POA: Diagnosis not present

## 2016-06-18 DIAGNOSIS — I10 Essential (primary) hypertension: Secondary | ICD-10-CM | POA: Diagnosis not present

## 2016-06-18 DIAGNOSIS — Z1211 Encounter for screening for malignant neoplasm of colon: Secondary | ICD-10-CM

## 2016-06-18 DIAGNOSIS — D120 Benign neoplasm of cecum: Secondary | ICD-10-CM

## 2016-06-18 DIAGNOSIS — Z86010 Personal history of colonic polyps: Secondary | ICD-10-CM | POA: Diagnosis not present

## 2016-06-18 MED ORDER — SODIUM CHLORIDE 0.9 % IV SOLN: 500.0000 mL | INTRAVENOUS | Status: DC

## 2016-06-18 NOTE — Patient Instructions (Signed)
YOU HAD AN ENDOSCOPIC PROCEDURE TODAY AT THE [facility]:   Refer to the procedure report that was given to you for any specific questions about what was found during the examination.  If the procedure report does not answer your questions, please call your gastroenterologist to clarify.  If you requested that your care partner not be given the details of your procedure findings, then the procedure report has been included in a sealed envelope for you to review at your convenience later.  YOU SHOULD EXPECT: Some feelings of bloating in the abdomen. Passage of more gas than usual.  Walking can help get rid of the air that was put into your GI tract during the procedure and reduce the bloating. If you had a lower endoscopy (such as a colonoscopy or flexible sigmoidoscopy) you may notice spotting of blood in your stool or on the toilet paper. If you underwent a bowel prep for your procedure, you may not have a normal bowel movement for a few days.  Please Note:  You might notice some irritation and congestion in your nose or some drainage.  This is from the oxygen used during your procedure.  There is no need for concern and it should clear up in a day or so.  SYMPTOMS TO REPORT IMMEDIATELY:   Following lower endoscopy (colonoscopy or flexible sigmoidoscopy):  Excessive amounts of blood in the stool  Significant tenderness or worsening of abdominal pains  Swelling of the abdomen that is new, acute  Fever of 100F or higher   For urgent or emergent issues, a gastroenterologist can be reached at any hour by calling [phone].   DIET:  We do recommend a small meal at first, but then you may proceed to your regular diet.  Drink plenty of fluids but you should avoid alcoholic beverages for 24 hours.  ACTIVITY:  You should plan to take it easy for the rest of today and you should NOT DRIVE or use heavy machinery until tomorrow (because of the sedation medicines used during the test).     FOLLOW UP: Our staff will call the number listed on your records the next business day following your procedure to check on you and address any questions or concerns that you may have regarding the information given to you following your procedure. If we do not reach you, we will leave a message.  However, if you are feeling well and you are not experiencing any problems, there is no need to return our call.  We will assume that you have returned to your regular daily activities without incident.  If any biopsies were taken you will be contacted by phone or by letter within the next 1-3 weeks.  Please call us at [phone] if you have not heard about the biopsies in 3 weeks.    SIGNATURES/CONFIDENTIALITY: You and/or your care partner have signed paperwork which will be entered into your electronic medical record.  These signatures attest to the fact that that the information above on your After Visit Summary has been reviewed and is understood.  Full responsibility of the confidentiality of this discharge information lies with you and/or your care-partner.   Resume medications. Information given on polyps,diverticulosis and hemorrhoids. 

## 2016-06-18 NOTE — Op Note (Signed)
[facility] Patient Name: Sarah Valdez Procedure Date: 06/18/2016 3:32 PM MRN: [id number] Endoscopist: Mauri Pole , MD Age: 70 Referring MD:  Date of Birth: [birth year] Gender: Female Account #: [id number] Procedure:                Colonoscopy Indications:              Screening for malignant neoplasm in the rectum Medicines:                Monitored Anesthesia Care Procedure:                Pre-Anesthesia Assessment:                           - Prior to the procedure, a History and Physical                            was performed, and patient medications and                            allergies were reviewed. The patient's tolerance of                            previous anesthesia was also reviewed. The risks                            and benefits of the procedure and the sedation                            options and risks were discussed with the patient.                            All questions were answered, and informed consent                            was obtained. Prior Anticoagulants: The patient has                            taken no previous anticoagulant or antiplatelet                            agents. ASA Grade Assessment: II - A patient with                            mild systemic disease. After reviewing the risks                            and benefits, the patient was deemed in                            satisfactory condition to undergo the procedure.                           After obtaining informed consent, the colonoscope  was passed under direct vision. Throughout the                            procedure, the patient's blood pressure, pulse, and                            oxygen saturations were monitored continuously. The                            Model CF-HQ190L [id number]) scope was introduced                            through the anus and advanced to the the terminal                            ileum,  with identification of the appendiceal                            orifice and IC valve. The colonoscopy was somewhat                            difficult due to multiple diverticula in the colon                            and restricted mobility of the colon. Successful                            completion of the procedure was aided by changing                            the patient to a supine position and withdrawing                            the scope and replacing with the pediatric                            colonoscope. The patient tolerated the procedure                            well. The quality of the bowel preparation was                            good. The ileocecal valve, appendiceal orifice, and                            rectum were photographed. Scope In: 4:00:37 PM Scope Out: 4:25:38 PM Scope Withdrawal Time: 0 hours 7 minutes 35 seconds  Total Procedure Duration: 0 hours 25 minutes 1 second  Findings:                 The perianal and digital rectal examinations were                            normal.  A 3 mm polyp was found in the cecum. The polyp was                            sessile. The polyp was removed with a cold biopsy                            forceps. Resection and retrieval were complete.                           Multiple small and large-mouthed diverticula were                            found in the sigmoid colon.                           Non-bleeding internal hemorrhoids were found during                            retroflexion. The hemorrhoids were small. Complications:            No immediate complications. Estimated Blood Loss:     Estimated blood loss was minimal. Impression:               - One 3 mm polyp in the cecum, removed with a cold                            biopsy forceps. Resected and retrieved.                           - Diverticulosis in the sigmoid colon.                           - Non-bleeding internal  hemorrhoids. Recommendation:           - Patient has a contact number available for                            emergencies. The signs and symptoms of potential                            delayed complications were discussed with the                            patient. Return to normal activities tomorrow.                            Written discharge instructions were provided to the                            patient.                           - Resume previous diet.                           - Continue present medications.                           -  Await pathology results.                           - Repeat colonoscopy in 5-10 years for surveillance                            based on pathology results. Mauri Pole, MD 06/18/2016 4:35:42 PM This report has been signed electronically.

## 2016-06-18 NOTE — Progress Notes (Signed)
Called to room to assist during endoscopic procedure.  Patient ID and intended procedure confirmed with present staff. Received instructions for my participation in the procedure from the performing physician.  

## 2016-06-18 NOTE — Progress Notes (Signed)
To PACU PT awake and alert. Report to RN 

## 2016-06-19 ENCOUNTER — Telehealth: Payer: Self-pay | Admitting: *Deleted

## 2016-06-19 DIAGNOSIS — S46011D Strain of muscle(s) and tendon(s) of the rotator cuff of right shoulder, subsequent encounter: Secondary | ICD-10-CM | POA: Diagnosis not present

## 2016-06-19 NOTE — Telephone Encounter (Signed)
  Follow up Call-  Call back number 06/18/2016  Post procedure Call Back phone  # [phone] cell  Permission to leave phone message Yes  Some recent data might be hidden     Patient questions:  Do you have a fever, pain , or abdominal swelling? No. Pain Score  0 *  Have you tolerated food without any problems? Yes.    Have you been able to return to your normal activities? Yes.    Do you have any questions about your discharge instructions: Diet   No. Medications  No. Follow up visit  No.  Do you have questions or concerns about your Care? No.  Actions: * If pain score is 4 or above: No action needed, pain <4.

## 2016-06-23 DIAGNOSIS — S46011A Strain of muscle(s) and tendon(s) of the rotator cuff of right shoulder, initial encounter: Secondary | ICD-10-CM | POA: Diagnosis not present

## 2016-06-23 DIAGNOSIS — M19011 Primary osteoarthritis, right shoulder: Secondary | ICD-10-CM | POA: Diagnosis not present

## 2016-06-29 ENCOUNTER — Encounter: Payer: Self-pay | Admitting: Gastroenterology

## 2016-07-01 DIAGNOSIS — M19011 Primary osteoarthritis, right shoulder: Secondary | ICD-10-CM | POA: Diagnosis not present

## 2016-07-07 DIAGNOSIS — S43431A Superior glenoid labrum lesion of right shoulder, initial encounter: Secondary | ICD-10-CM | POA: Diagnosis not present

## 2016-07-07 DIAGNOSIS — S46011A Strain of muscle(s) and tendon(s) of the rotator cuff of right shoulder, initial encounter: Secondary | ICD-10-CM | POA: Diagnosis not present

## 2016-07-07 DIAGNOSIS — M19011 Primary osteoarthritis, right shoulder: Secondary | ICD-10-CM | POA: Diagnosis not present

## 2016-07-23 ENCOUNTER — Encounter: Payer: Medicare Other | Admitting: Gastroenterology

## 2016-07-24 DIAGNOSIS — R350 Frequency of micturition: Secondary | ICD-10-CM | POA: Diagnosis not present

## 2016-07-24 DIAGNOSIS — R351 Nocturia: Secondary | ICD-10-CM | POA: Diagnosis not present

## 2016-07-25 DIAGNOSIS — E559 Vitamin D deficiency, unspecified: Secondary | ICD-10-CM | POA: Diagnosis not present

## 2016-07-25 DIAGNOSIS — E784 Other hyperlipidemia: Secondary | ICD-10-CM | POA: Diagnosis not present

## 2016-07-25 DIAGNOSIS — Z0000 Encounter for general adult medical examination without abnormal findings: Secondary | ICD-10-CM | POA: Diagnosis not present

## 2016-08-01 DIAGNOSIS — E784 Other hyperlipidemia: Secondary | ICD-10-CM | POA: Diagnosis not present

## 2016-08-01 DIAGNOSIS — Z1389 Encounter for screening for other disorder: Secondary | ICD-10-CM | POA: Diagnosis not present

## 2016-08-01 DIAGNOSIS — H8110 Benign paroxysmal vertigo, unspecified ear: Secondary | ICD-10-CM | POA: Diagnosis not present

## 2016-08-01 DIAGNOSIS — R600 Localized edema: Secondary | ICD-10-CM | POA: Diagnosis not present

## 2016-08-01 DIAGNOSIS — Z6826 Body mass index (BMI) 26.0-26.9, adult: Secondary | ICD-10-CM | POA: Diagnosis not present

## 2016-08-01 DIAGNOSIS — M7501 Adhesive capsulitis of right shoulder: Secondary | ICD-10-CM | POA: Diagnosis not present

## 2016-08-01 DIAGNOSIS — E559 Vitamin D deficiency, unspecified: Secondary | ICD-10-CM | POA: Diagnosis not present

## 2016-08-01 DIAGNOSIS — I878 Other specified disorders of veins: Secondary | ICD-10-CM | POA: Diagnosis not present

## 2016-08-01 DIAGNOSIS — Z23 Encounter for immunization: Secondary | ICD-10-CM | POA: Diagnosis not present

## 2016-08-01 DIAGNOSIS — M859 Disorder of bone density and structure, unspecified: Secondary | ICD-10-CM | POA: Diagnosis not present

## 2016-08-01 DIAGNOSIS — Z0000 Encounter for general adult medical examination without abnormal findings: Secondary | ICD-10-CM | POA: Diagnosis not present

## 2016-08-01 DIAGNOSIS — I8390 Asymptomatic varicose veins of unspecified lower extremity: Secondary | ICD-10-CM | POA: Diagnosis not present

## 2016-09-02 DIAGNOSIS — M19011 Primary osteoarthritis, right shoulder: Secondary | ICD-10-CM | POA: Diagnosis not present

## 2016-09-02 DIAGNOSIS — S46011A Strain of muscle(s) and tendon(s) of the rotator cuff of right shoulder, initial encounter: Secondary | ICD-10-CM | POA: Diagnosis not present

## 2016-09-02 DIAGNOSIS — S46001A Unspecified injury of muscle(s) and tendon(s) of the rotator cuff of right shoulder, initial encounter: Secondary | ICD-10-CM | POA: Diagnosis not present

## 2016-09-02 DIAGNOSIS — Y999 Unspecified external cause status: Secondary | ICD-10-CM | POA: Diagnosis not present

## 2016-09-02 DIAGNOSIS — M94211 Chondromalacia, right shoulder: Secondary | ICD-10-CM | POA: Diagnosis not present

## 2016-09-02 DIAGNOSIS — G8918 Other acute postprocedural pain: Secondary | ICD-10-CM | POA: Diagnosis not present

## 2016-09-02 DIAGNOSIS — S43421A Sprain of right rotator cuff capsule, initial encounter: Secondary | ICD-10-CM | POA: Diagnosis not present

## 2016-09-02 DIAGNOSIS — M24111 Other articular cartilage disorders, right shoulder: Secondary | ICD-10-CM | POA: Diagnosis not present

## 2016-09-09 DIAGNOSIS — S46011D Strain of muscle(s) and tendon(s) of the rotator cuff of right shoulder, subsequent encounter: Secondary | ICD-10-CM | POA: Diagnosis not present

## 2016-09-09 DIAGNOSIS — Z4789 Encounter for other orthopedic aftercare: Secondary | ICD-10-CM | POA: Diagnosis not present

## 2016-09-09 DIAGNOSIS — S43431D Superior glenoid labrum lesion of right shoulder, subsequent encounter: Secondary | ICD-10-CM | POA: Diagnosis not present

## 2016-09-15 DIAGNOSIS — S46011D Strain of muscle(s) and tendon(s) of the rotator cuff of right shoulder, subsequent encounter: Secondary | ICD-10-CM | POA: Diagnosis not present

## 2016-09-17 DIAGNOSIS — S46011D Strain of muscle(s) and tendon(s) of the rotator cuff of right shoulder, subsequent encounter: Secondary | ICD-10-CM | POA: Diagnosis not present

## 2016-09-22 DIAGNOSIS — S46011D Strain of muscle(s) and tendon(s) of the rotator cuff of right shoulder, subsequent encounter: Secondary | ICD-10-CM | POA: Diagnosis not present

## 2016-09-26 DIAGNOSIS — S46011D Strain of muscle(s) and tendon(s) of the rotator cuff of right shoulder, subsequent encounter: Secondary | ICD-10-CM | POA: Diagnosis not present

## 2016-09-29 DIAGNOSIS — S46011D Strain of muscle(s) and tendon(s) of the rotator cuff of right shoulder, subsequent encounter: Secondary | ICD-10-CM | POA: Diagnosis not present

## 2016-09-30 DIAGNOSIS — R351 Nocturia: Secondary | ICD-10-CM | POA: Diagnosis not present

## 2016-10-02 DIAGNOSIS — S46011D Strain of muscle(s) and tendon(s) of the rotator cuff of right shoulder, subsequent encounter: Secondary | ICD-10-CM | POA: Diagnosis not present

## 2016-10-06 DIAGNOSIS — Z4789 Encounter for other orthopedic aftercare: Secondary | ICD-10-CM | POA: Diagnosis not present

## 2016-10-06 DIAGNOSIS — S46011D Strain of muscle(s) and tendon(s) of the rotator cuff of right shoulder, subsequent encounter: Secondary | ICD-10-CM | POA: Diagnosis not present

## 2016-10-06 DIAGNOSIS — S43431D Superior glenoid labrum lesion of right shoulder, subsequent encounter: Secondary | ICD-10-CM | POA: Diagnosis not present

## 2016-10-09 DIAGNOSIS — S46011D Strain of muscle(s) and tendon(s) of the rotator cuff of right shoulder, subsequent encounter: Secondary | ICD-10-CM | POA: Diagnosis not present

## 2016-10-13 DIAGNOSIS — S46011D Strain of muscle(s) and tendon(s) of the rotator cuff of right shoulder, subsequent encounter: Secondary | ICD-10-CM | POA: Diagnosis not present

## 2016-10-15 DIAGNOSIS — S46011D Strain of muscle(s) and tendon(s) of the rotator cuff of right shoulder, subsequent encounter: Secondary | ICD-10-CM | POA: Diagnosis not present

## 2016-10-20 DIAGNOSIS — S46011D Strain of muscle(s) and tendon(s) of the rotator cuff of right shoulder, subsequent encounter: Secondary | ICD-10-CM | POA: Diagnosis not present

## 2016-10-22 DIAGNOSIS — R600 Localized edema: Secondary | ICD-10-CM | POA: Diagnosis not present

## 2016-10-22 DIAGNOSIS — I8390 Asymptomatic varicose veins of unspecified lower extremity: Secondary | ICD-10-CM | POA: Diagnosis not present

## 2016-10-22 DIAGNOSIS — I8310 Varicose veins of unspecified lower extremity with inflammation: Secondary | ICD-10-CM | POA: Diagnosis not present

## 2016-10-22 DIAGNOSIS — R590 Localized enlarged lymph nodes: Secondary | ICD-10-CM | POA: Diagnosis not present

## 2016-10-22 DIAGNOSIS — Z6827 Body mass index (BMI) 27.0-27.9, adult: Secondary | ICD-10-CM | POA: Diagnosis not present

## 2016-10-22 DIAGNOSIS — M79604 Pain in right leg: Secondary | ICD-10-CM | POA: Diagnosis not present

## 2016-10-24 DIAGNOSIS — S46011D Strain of muscle(s) and tendon(s) of the rotator cuff of right shoulder, subsequent encounter: Secondary | ICD-10-CM | POA: Diagnosis not present

## 2016-10-27 DIAGNOSIS — S46011D Strain of muscle(s) and tendon(s) of the rotator cuff of right shoulder, subsequent encounter: Secondary | ICD-10-CM | POA: Diagnosis not present

## 2016-10-29 ENCOUNTER — Ambulatory Visit: Payer: Medicare Other | Attending: Internal Medicine | Admitting: Physical Therapy

## 2016-10-29 ENCOUNTER — Encounter: Payer: Self-pay | Admitting: Physical Therapy

## 2016-10-29 DIAGNOSIS — R42 Dizziness and giddiness: Secondary | ICD-10-CM | POA: Diagnosis not present

## 2016-10-29 DIAGNOSIS — R2681 Unsteadiness on feet: Secondary | ICD-10-CM | POA: Diagnosis not present

## 2016-10-29 NOTE — Therapy (Addendum)
[facility] [address] [place], [place], [address] Phone: [phone]   Fax:  [phone]  Physical Therapy Evaluation  Patient Details  Name: Sarah Valdez MRN: [id number] Date of Birth: Oct 27, 1945 Referring Provider: Shon Baton, MD  Encounter Date: 10/29/2016      PT End of Session - 10/29/16 1630    Visit Number 1   Number of Visits 4   Date for PT Re-Evaluation 12/13/16   Authorization Type Medicare-progress note and G code every 10th visit   PT Start Time 1530   PT Stop Time 1615   PT Time Calculation (min) 45 min   Activity Tolerance Patient tolerated treatment well   Behavior During Therapy [facility] for tasks assessed/performed      Past Medical History:  Diagnosis Date  . Allergy   . Cancer ([place])    skin cancer - on back, forehead  . Diverticulosis of colon 2007   Dr Olevia Perches  . History of basal cell carcinoma excision    X3  . History of colon polyps    NON-CANCEROUS  . History of kidney stones   . Hyperlipidemia   . OA (osteoarthritis of spine)   . Palpitations    tx with diltiazem  . PONV (postoperative nausea and vomiting)   . PSVT (paroxysmal supraventricular tachycardia) ([place])   . Right ureteral stone   . Urgency of urination   . Wears glasses     Past Surgical History:  Procedure Laterality Date  . APPENDECTOMY  1950  . 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 CLOSED MANIPULATION POST TOTAL  KNEE ARTHROPLASTY  07-13-2006  . COLONOSCOPY W/ POLYPECTOMY  2002     adenomatous; Dr Olevia Perches  . EXTRACORPOREAL SHOCK WAVE LITHOTRIPSY  X3  . FACELIFT W/BLEPHAROPLASTY  2005  . HYSTEROSCOPY W/D&C  12-22-2000  . OPEN KNEE MENISECTOMY  1964  . RIGHT URETEROSCOPIC STONE EXTRACTION / STENT PLACEMENT  08-21-2008  . TONSILLECTOMY  1950  . TOTAL KNEE ARTHROPLASTY Right 05-30-2006  . TUBAL LIGATION    . WISDOM TOOTH EXTRACTION       There were no vitals filed for this visit.       Subjective Assessment - 10/29/16 1523    Subjective Pt presents to OPPT due to h/o vertigo since September 2017; reports going to MD during first episode and they performed Epley maneuver.  Went to [place] and had no episodes of dizziness.  After returning to the [place] pt began experiencing intermittent episodes of dizziness progressing to constant. Has been taking meclizine with minimal improvement in symptoms.  Reports constant "white noise" in ears and "aura" or pressure behind eyes.  She also reports intermittent cognitive/attention impairments.  Reports edema in bilat LE over past couple of weeks and has been taking antibiotics for possible cellulits.   Pertinent History osteopenia, chronic LBP, scoliosis, LE edema, nocturia   Patient Stated Goals Be done with the dizziness   Currently in Pain? Yes  rotator cuff injury; being address by another therapist            [facility] PT Assessment - 10/29/16 1523      Assessment   Medical Diagnosis Vertigo   Referring Provider Shon Baton, MD   Onset Date/Surgical Date 10/23/16   Hand Dominance Right   Next MD Visit tomorrow   Prior Therapy in therapy now for rotator cuff injury     Balance Screen  Has the patient fallen in the past 6 months No   Has the patient had a decrease in activity level because of a fear of falling?  No   Is the patient reluctant to leave their home because of a fear of falling?  No     Home Environment   Living Environment Private residence   Living Arrangements Spouse/significant other   Type of [place] to enter   Entrance Stairs-Number of Steps 2   [place] Two level;Able to live on main level with bedroom/bathroom     Prior Function   Level of Independence Independent     Observation/Other Assessments   Focus on Therapeutic Outcomes (FOTO)  86(14% limited, predicted 18% limited)   Other  Surveys  Other Surveys   Dizziness Handicap Inventory [facility])  22            Vestibular Assessment - 10/29/16 1545      Vestibular Assessment   General Observation reports "white noise" constant in ears, denies changes in vision but does report pressure behind both eyes, reports sensitivity to light and noise.  Reports house security alarm went off the other day and as a result pt experienced significant dizziness, headache and nausea-severe in nature.  Reports migraines as a child.     Symptom Behavior   Type of Dizziness "Funny feeling in head"   Frequency of Dizziness daily   Duration of Dizziness constant   Aggravating Factors Comment  noise   Relieving Factors Comments  popping her ears     Occulomotor Exam   Occulomotor Alignment Normal   Spontaneous Absent   Gaze-induced Absent   Smooth Pursuits Intact   Saccades Slow;Poor trajectory   Comment Convergence absent     Vestibulo-Occular Reflex   VOR 1 Head Only (x 1 viewing) intact   VOR Cancellation Normal   Comment HIT: negative bilaterally, test of skew negative     Visual Acuity   Static 7   Dynamic 5     Other Tests   Tragal reports blurring of vision when pressing on R side     Positional Testing   Dix-Hallpike Dix-Hallpike Right;Dix-Hallpike Left   Horizontal Canal Testing Horizontal Canal Right;Horizontal Canal Left     Dix-Hallpike Right   Dix-Hallpike Right Duration 0   Dix-Hallpike Right Symptoms No nystagmus     Dix-Hallpike Left   Dix-Hallpike Left Duration 0   Dix-Hallpike Left Symptoms No nystagmus     Horizontal Canal Right   Horizontal Canal Right Duration 0   Horizontal Canal Right Symptoms Normal     Horizontal Canal Left   Horizontal Canal Left Duration 0   Horizontal Canal Left Symptoms Normal            PT Education - 10/29/16 1628    Education provided Yes   Education Details clinical findings, vestibular migraines and PT POC   Person(s) Educated Patient   Methods  Explanation   Comprehension Verbalized understanding             PT Long Term Goals - 10/29/16 1641      PT LONG TERM GOAL #1   Title (TARGET DATE FOR ALL LTG 12/13/2016) Pt will report a 18 point decrease on DHI   Baseline 22   Status New     PT LONG TERM GOAL #2   Title Pt will demonstrate independence with habituation HEP    Status New  PT LONG TERM GOAL #3   Title Pt will participate in SOT testing with LTG to be written   Status New     PT LONG TERM GOAL #4   Title Pt will be able to verbalize symptoms of vestibular migraine and possible triggers   Status New     PT LONG TERM GOAL #5   Title Pt will participate in FGA assessment with LTG to be set   Status New               Plan - Nov 09, 2016 1631    Clinical Impression Statement Pt is a 71 year old female presenting to OPPT neuro for low complexity PT evaluation for persistent vertigo. Pt's PMH significant for the following: rotator cuff tear, migraines, osteopenia, low back pain/scoliosis, fatty liver, LE edema, nocturia. The following deficits were noted during pt's exam: abnormal saccades and convergence, dysequilibrium and motion sensitivity in association with vestibular migraines.  Due to pt taking Meclizine, recommending pt return to PT for further evaluation after 1-2 days of not taking meclizine.  Pt would benefit from skilled PT to address these impairments and functional limitations to maximize functional mobility independence and reduce falls risk.   Rehab Potential Good   PT Frequency Biweekly   PT Duration 6 weeks   PT Treatment/Interventions ADLs/Self Care Home Management;Canalith Repostioning;Balance training;Neuromuscular re-education;Patient/family education;Vestibular   PT Next Visit Plan Pt to return to [place] for ENT referral; return to PT for further evaluation without Meclizine    Recommended Other Services ENT   Consulted and Agree with Plan of Care Patient      Patient will benefit from  skilled therapeutic intervention in order to improve the following deficits and impairments:  Decreased balance, Dizziness  Visit Diagnosis: Dizziness and giddiness  Unsteadiness on feet      G-Codes - 09-Nov-2016 1644    Functional Assessment Tool Used (Outpatient Only) DHI: 22, clinical judgement    Functional Limitation Mobility: Walking and moving around   Mobility: Walking and Moving Around Current Status [phone]) At least 20 percent but less than 40 percent impaired, limited or restricted   Mobility: Walking and Moving Around Goal Status [phone]) At least 1 percent but less than 20 percent impaired, limited or restricted       Problem List Patient Active Problem List   Diagnosis Date Noted  . Hepatic steatosis 01/20/2014  . Gallstones 01/20/2014  . Pancreatic cyst 01/20/2014  . Abnormal CT scan, liver 01/02/2014  . Back spasm 06/10/2013  . Osteopenia 09/20/2012  . COUGH VARIANT ASTHMA 02/11/2010  . Diverticulosis of large intestine 12/25/2009  . Wrist osteonecrosis 12/25/2009  . Vitamin D deficiency 11/28/2008  . NEPHROLITHIASIS, HX OF 11/28/2008  . Dyslipidemia 11/24/2007  . Allergic rhinitis, cause unspecified 11/24/2007  . PALPITATIONS 11/24/2007  . DEGENERATIVE DISC DISEASE 01/19/2007  . SKIN CANCER, HX OF 01/19/2007  . COLONIC POLYPS, HX OF 01/19/2007   Raylene Everts, PT, DPT 2016/11/09    4:45 PM  PHYSICAL THERAPY DISCHARGE SUMMARY  Visits from Start of Care: 1  Current functional level related to goals / functional outcomes: See above   Remaining deficits: See above-pt consulted with ENT and requested to be D/C from therapy at this time.  Pt cancelled all therapy visits.   Education / Equipment: Clinical findings and recommendations for treatment.  Plan: Patient agrees to discharge.  Patient goals were not met. Patient is being discharged due to not returning since the last visit.  ?????  Raylene Everts, PT, DPT 12/01/16    9:13 AM    [facility] [address] [place], [place], [address] Phone: [phone]   Fax:  [phone]  Name: Sarah Valdez MRN: [id number] Date of Birth: Jul 01, 1946

## 2016-10-30 DIAGNOSIS — R351 Nocturia: Secondary | ICD-10-CM | POA: Diagnosis not present

## 2016-10-30 DIAGNOSIS — M859 Disorder of bone density and structure, unspecified: Secondary | ICD-10-CM | POA: Diagnosis not present

## 2016-10-30 DIAGNOSIS — S46011D Strain of muscle(s) and tendon(s) of the rotator cuff of right shoulder, subsequent encounter: Secondary | ICD-10-CM | POA: Diagnosis not present

## 2016-11-03 DIAGNOSIS — S43431D Superior glenoid labrum lesion of right shoulder, subsequent encounter: Secondary | ICD-10-CM | POA: Diagnosis not present

## 2016-11-03 DIAGNOSIS — S46011D Strain of muscle(s) and tendon(s) of the rotator cuff of right shoulder, subsequent encounter: Secondary | ICD-10-CM | POA: Diagnosis not present

## 2016-11-03 DIAGNOSIS — Z4789 Encounter for other orthopedic aftercare: Secondary | ICD-10-CM | POA: Diagnosis not present

## 2016-11-06 DIAGNOSIS — H903 Sensorineural hearing loss, bilateral: Secondary | ICD-10-CM | POA: Insufficient documentation

## 2016-11-06 DIAGNOSIS — R2689 Other abnormalities of gait and mobility: Secondary | ICD-10-CM | POA: Insufficient documentation

## 2016-11-06 DIAGNOSIS — R42 Dizziness and giddiness: Secondary | ICD-10-CM | POA: Diagnosis not present

## 2016-11-06 DIAGNOSIS — H9313 Tinnitus, bilateral: Secondary | ICD-10-CM | POA: Diagnosis not present

## 2016-11-06 HISTORY — DX: Sensorineural hearing loss, bilateral: H90.3

## 2016-11-10 DIAGNOSIS — S46011D Strain of muscle(s) and tendon(s) of the rotator cuff of right shoulder, subsequent encounter: Secondary | ICD-10-CM | POA: Diagnosis not present

## 2016-11-11 ENCOUNTER — Other Ambulatory Visit: Payer: Self-pay | Admitting: Otolaryngology

## 2016-11-11 DIAGNOSIS — R2689 Other abnormalities of gait and mobility: Secondary | ICD-10-CM

## 2016-11-13 DIAGNOSIS — S46011D Strain of muscle(s) and tendon(s) of the rotator cuff of right shoulder, subsequent encounter: Secondary | ICD-10-CM | POA: Diagnosis not present

## 2016-11-14 ENCOUNTER — Encounter: Payer: Medicare Other | Admitting: Physical Therapy

## 2016-11-17 DIAGNOSIS — S46011D Strain of muscle(s) and tendon(s) of the rotator cuff of right shoulder, subsequent encounter: Secondary | ICD-10-CM | POA: Diagnosis not present

## 2016-11-27 ENCOUNTER — Ambulatory Visit
Admission: RE | Admit: 2016-11-27 | Discharge: 2016-11-27 | Disposition: A | Discharge status: Home / Self Care | Payer: Medicare Other | Source: Ambulatory Visit | Attending: Otolaryngology | Admitting: Otolaryngology

## 2016-11-27 DIAGNOSIS — S0990XA Unspecified injury of head, initial encounter: Secondary | ICD-10-CM | POA: Diagnosis not present

## 2016-11-27 DIAGNOSIS — R2689 Other abnormalities of gait and mobility: Secondary | ICD-10-CM

## 2016-11-27 MED ORDER — GADOBENATE DIMEGLUMINE 529 MG/ML IV SOLN: 16.0000 mL | Freq: Once | INTRAVENOUS | Status: DC | PRN

## 2016-11-28 ENCOUNTER — Encounter: Payer: Medicare Other | Admitting: Rehabilitative and Restorative Service Providers"

## 2016-11-28 DIAGNOSIS — S46011D Strain of muscle(s) and tendon(s) of the rotator cuff of right shoulder, subsequent encounter: Secondary | ICD-10-CM | POA: Diagnosis not present

## 2016-12-01 DIAGNOSIS — S46011D Strain of muscle(s) and tendon(s) of the rotator cuff of right shoulder, subsequent encounter: Secondary | ICD-10-CM | POA: Diagnosis not present

## 2016-12-05 DIAGNOSIS — S46011D Strain of muscle(s) and tendon(s) of the rotator cuff of right shoulder, subsequent encounter: Secondary | ICD-10-CM | POA: Diagnosis not present

## 2016-12-09 DIAGNOSIS — S46011D Strain of muscle(s) and tendon(s) of the rotator cuff of right shoulder, subsequent encounter: Secondary | ICD-10-CM | POA: Diagnosis not present

## 2016-12-11 DIAGNOSIS — S46011D Strain of muscle(s) and tendon(s) of the rotator cuff of right shoulder, subsequent encounter: Secondary | ICD-10-CM | POA: Diagnosis not present

## 2016-12-12 ENCOUNTER — Encounter: Payer: Medicare Other | Admitting: Physical Therapy

## 2016-12-15 DIAGNOSIS — S46011D Strain of muscle(s) and tendon(s) of the rotator cuff of right shoulder, subsequent encounter: Secondary | ICD-10-CM | POA: Diagnosis not present

## 2016-12-16 DIAGNOSIS — D1801 Hemangioma of skin and subcutaneous tissue: Secondary | ICD-10-CM | POA: Diagnosis not present

## 2016-12-16 DIAGNOSIS — Z85828 Personal history of other malignant neoplasm of skin: Secondary | ICD-10-CM | POA: Diagnosis not present

## 2016-12-16 DIAGNOSIS — L738 Other specified follicular disorders: Secondary | ICD-10-CM | POA: Diagnosis not present

## 2016-12-16 DIAGNOSIS — L814 Other melanin hyperpigmentation: Secondary | ICD-10-CM | POA: Diagnosis not present

## 2016-12-16 DIAGNOSIS — L821 Other seborrheic keratosis: Secondary | ICD-10-CM | POA: Diagnosis not present

## 2016-12-17 ENCOUNTER — Telehealth: Payer: Self-pay

## 2016-12-17 ENCOUNTER — Ambulatory Visit: Payer: Medicare Other | Admitting: Neurology

## 2016-12-17 NOTE — Telephone Encounter (Signed)
Pt no-showed her new patient appt this morning. 

## 2016-12-25 ENCOUNTER — Encounter: Payer: Self-pay | Admitting: Neurology

## 2016-12-25 ENCOUNTER — Ambulatory Visit (INDEPENDENT_AMBULATORY_CARE_PROVIDER_SITE_OTHER): Payer: Medicare Other | Admitting: Neurology

## 2016-12-25 VITALS — BP 108/66 | HR 75 | Ht 67.0 in | Wt 177.2 lb

## 2016-12-25 DIAGNOSIS — Z9181 History of falling: Secondary | ICD-10-CM

## 2016-12-25 DIAGNOSIS — I63412 Cerebral infarction due to embolism of left middle cerebral artery: Secondary | ICD-10-CM

## 2016-12-25 DIAGNOSIS — I639 Cerebral infarction, unspecified: Secondary | ICD-10-CM

## 2016-12-25 DIAGNOSIS — R2689 Other abnormalities of gait and mobility: Secondary | ICD-10-CM | POA: Diagnosis not present

## 2016-12-25 DIAGNOSIS — R7309 Other abnormal glucose: Secondary | ICD-10-CM | POA: Diagnosis not present

## 2016-12-25 NOTE — Progress Notes (Signed)
[facility]    Provider:  Dr Jaynee Eagles Referring Provider:  Melida Quitter, MD Primary Care Physician:  Shon Baton, MD  CC:  Stroke  HPI:  Sarah Valdez is a 71 y.o. female here as a referral from Dr. Virgina Jock for stroke. She has a history of palpitations. 10 years ago she had extensive palpitations. MRi results showed subacute strokes. She has a history of supraventricular tacycardia. No history of afib. PMHx OA, HLD, She was not on anti-platelet prior to dizziness. Dizziness since September, waxes and wanes, sometimes it is room spinning or imbalance. Currently she is fine but a week ago it was more imbalance when standing. She had a fall at [place], she was rushing and she fell and hit her head on th left side. She bit her lip. Otherwise she can;t remember any event that may have happened in the last several months coinciding with the MRI results. She tends to walk and catches her toe and has falls. She has had vestibular therapy but never for gait abnormality or falls. No other focal neurologic deficits, associated symptoms, inciting events or modifiable factors.  Reviewed notes, labs and imaging from outside physicians, which showed:  Reviewed ENT notes. She presented with a chief complaint of dizziness and hearing loss. This started last fall when she started feeling imbalance and standing. There has been some room spinning. Onset was fairly sudden. PCP prescribed meclizine. The feeling comes and goes and goes away for a few weeks and comes back for a few weeks. Meclizine is helpful. The feeling is constant even when not moving. Head movements do not affect the feeling. She has white noise all the time that worsens. There is an associated pressure feeling in the head. When she stands the imbalance worsens. She has trouble hearing high-pitched sounds. She work with a physical therapist who did not find a problem. She has a remote history of migraines. Exam showed normal on exam,  hearing test demonstrates symmetric sensorineural loss. Her imbalance symptoms not typical of an inner ear process. He recommended brain imaging.  Personally reviewed MRI images and agree with the following:  IMPRESSION: Four foci of low level restricted diffusion and contrast enhancement in the deep white matter of the left hemisphere probably consistent with late subacute small vessel infarctions. These could be embolic or watershed. No evidence of cortical infarction. The differential diagnosis does include demyelinating disease, but that is felt less likely.  Mild chronic small-vessel ischemic change of the hemispheric white matter in addition.  LDL 96 in 2016  Review of Systems: Patient complains of symptoms per HPI as well as the following symptoms: no CP, no SOB. Pertinent negatives per HPI. All others negative.   Social History   Social History  . Marital status: Married    Spouse name: N/A  . Number of children: N/A  . Years of education: N/A   Occupational History  . Not on file.   Social History Main Topics  . Smoking status: Never Smoker  . Smokeless tobacco: Never Used  . Alcohol use 3.0 oz/week    5 Glasses of wine per week  . Drug use: No  . Sexual activity: Not on file   Other Topics Concern  . Not on file   Social History Narrative  . No narrative on file    Family History  Problem Relation Age of Onset  . Heart attack Mother 27  . Emphysema Father     smoker  . Colon polyps Father   .  Breast cancer Sister   . Cancer Paternal Grandfather     throat cancer  . Esophageal cancer Paternal Grandfather   . Heart attack Maternal Grandfather 76  . Diabetes Neg Hx   . Stroke Neg Hx   . Colon cancer Neg Hx   . Rectal cancer Neg Hx   . Stomach cancer Neg Hx     Past Medical History:  Diagnosis Date  . Allergy   . Cancer ([place])    skin cancer - on back, forehead  . Diverticulosis of colon 2007   Dr Olevia Perches  . History of basal cell carcinoma  excision    X3  . History of colon polyps    NON-CANCEROUS  . History of kidney stones   . Hyperlipidemia   . OA (osteoarthritis of spine)   . Palpitations    tx with diltiazem  . PONV (postoperative nausea and vomiting)   . PSVT (paroxysmal supraventricular tachycardia) ([place])   . Right ureteral stone   . Urgency of urination   . Wears glasses     Past Surgical History:  Procedure Laterality Date  . APPENDECTOMY  1950  . 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 CLOSED MANIPULATION POST TOTAL  KNEE ARTHROPLASTY  07-13-2006  . COLONOSCOPY W/ POLYPECTOMY  2002     adenomatous; Dr Olevia Perches  . EXTRACORPOREAL SHOCK WAVE LITHOTRIPSY  X3  . FACELIFT W/BLEPHAROPLASTY  2005  . HYSTEROSCOPY W/D&C  12-22-2000  . OPEN KNEE MENISECTOMY  1964  . RIGHT URETEROSCOPIC STONE EXTRACTION / STENT PLACEMENT  08-21-2008  . rotator cuff on right shoulder    . TONSILLECTOMY  1950  . TOTAL KNEE ARTHROPLASTY Right 05-30-2006  . TUBAL LIGATION    . WISDOM TOOTH EXTRACTION      Current Outpatient Prescriptions  Medication Sig Dispense Refill  . diclofenac sodium (VOLTAREN) 1 % GEL Apply 2 g topically 4 (four) times daily.    Marland Kitchen diltiazem (CARDIZEM) 120 MG tablet TAKE 1 TABLET TWICE DAILY 180 tablet 0  . fluticasone (FLONASE) 50 MCG/ACT nasal spray Place into both nostrils daily.    . furosemide (LASIX) 20 MG tablet TAKE ONE TABLET BY MOUTH DAILY AS NEEDED FOR SWELLING 30 tablet 1  . meclizine (ANTIVERT) 25 MG tablet Take 25 mg by mouth 3 (three) times daily as needed for dizziness.    Marland Kitchen MELATONIN PO Take by mouth at bedtime as needed.     . naproxen sodium (ANAPROX) 220 MG tablet Take 220 mg by mouth every morning.    . pravastatin (PRAVACHOL) 40 MG tablet Take 1 tablet (40 mg total) by mouth daily. 90 tablet 0   No current facility-administered medications for this visit.     Allergies as of 12/25/2016 - Review Complete  12/25/2016  Allergen Reaction Noted  . Morphine Other (See Comments) 01/19/2007  . Penicillins Rash 01/19/2007  . Levofloxacin Other (See Comments)     Vitals: BP 108/66 (BP Location: Right Arm, Patient Position: Sitting, Cuff Size: Small)   Pulse 75   Ht 5\' 7"  (1.702 m)   Wt 177 lb 3.2 oz (80.4 kg)   BMI 27.75 kg/m  Last Weight:  Wt Readings from Last 1 Encounters:  12/25/16 177 lb 3.2 oz (80.4 kg)   Last Height:   Ht Readings from Last 1 Encounters:  12/25/16 5\' 7"  (1.702 m)   Physical exam: Exam: Gen: NAD, conversant, well nourised, well groomed  CV: RRR, no MRG. No Carotid Bruits. +peripheral edema, warm, nontender Eyes: Conjunctivae clear without exudates or hemorrhage  Neuro: Detailed Neurologic Exam  Speech:    Speech is normal; fluent and spontaneous with normal comprehension.  Cognition:    The patient is oriented to person, place, and time;     recent and remote memory intact;     language fluent;     normal attention, concentration,     fund of knowledge Cranial Nerves:    The pupils are equal, round, and reactive to light. Attempted fundoscopic exam could not visualize due to small pupils Visual fields are full to finger confrontation. Extraocular movements are intact. Trigeminal sensation is intact and the muscles of mastication are normal. The face is symmetric. The palate elevates in the midline. Hearing intact. Voice is normal. Shoulder shrug is normal. The tongue has normal motion without fasciculations.   Coordination:    No dysmetria  Gait:    Imbalance with heel-toe and tandem gait   Motor Observation:    No asymmetry, no atrophy, and no involuntary movements noted. Tone:    Normal muscle tone.    Posture:    Posture is normal. normal erect    Strength:    Strength is V/V in the upper and lower limbs.      Sensation: intact to LT     Reflex Exam:  DTR's:    Deep tendon reflexes in the upper and lower extremities  are symmetrical bilaterally.   Toes:    The toes are downgoing bilaterally.   Clonus:    Clonus is absent.    Assessment/Plan:  70 year old with chronic dizziness and imbalance for 6-7 months. MRI one month ago showed foci of low level restricted diffusion and contrast enhancement in the deep white matter of the left hemisphere probably consistent with late subacute small vessel infarctions. These could be embolic or watershed.   Need repeat MRI brain in 2-3 months  to watch for evolution of white-matter lesions to ensure they were infarcts; in the subacute stage it is difficult to determine with certainty the etiology of lesions Physical therapy for gait and imbalance, falls Needs a stroke workup: Need imaging of the blood vessels of the head and neck, CTA of the head and neck, echocardiogram of heart, 30 day heart monitor. Will repeat MRI brain in 2 months.  Will check labs today ASA 325mg  daily  Orders Placed This Encounter  Procedures  . CT ANGIO HEAD W OR WO CONTRAST  . CT ANGIO NECK W OR WO CONTRAST  . Hemoglobin A1c  . Basic Metabolic Panel  . Ambulatory referral to Physical Therapy  . Cardiac event monitor  . ECHOCARDIOGRAM COMPLETE BUBBLE STUDY   Cc: Drs Virgina Jock and Marcha Solders, [facility] [address] [place], [place] [address]  Phone [phone] Fax [phone]

## 2016-12-25 NOTE — Patient Instructions (Signed)
Remember to drink plenty of fluid, eat healthy meals and do not skip any meals. Try to eat protein with a every meal and eat a healthy snack such as fruit or nuts in between meals. Try to keep a regular sleep-wake schedule and try to exercise daily, particularly in the form of walking, 20-30 minutes a day, if you can.   As far as your medications are concerned, I would like to suggest: asa 325mg   As far as diagnostic testing: CTA of the head and neck (looking at blood vessels), Lab today, echocardiogram and repeat MRI in 2 months  I would like to see you back in 10 weeks, sooner if we need to. Please call us with any interim questions, concerns, problems, updates or refill requests.   Our phone number is [phone]. We also have an after hours call service for urgent matters and there is a physician on-call for urgent questions. For any emergencies you know to call 911 or go to the nearest emergency room

## 2016-12-26 ENCOUNTER — Telehealth: Payer: Self-pay

## 2016-12-26 ENCOUNTER — Encounter: Payer: Medicare Other | Admitting: Physical Therapy

## 2016-12-26 LAB — BASIC METABOLIC PANEL
BUN/Creatinine Ratio: 25 (ref 12–28)
BUN: 21 mg/dL (ref 8–27)
CALCIUM: 9.8 mg/dL (ref 8.7–10.3)
CHLORIDE: 105 mmol/L (ref 96–106)
CO2: 22 mmol/L (ref 18–29)
Creatinine, Ser: 0.85 mg/dL (ref 0.57–1.00)
GFR calc Af Amer: 79 mL/min/{1.73_m2} (ref >59)
GFR calc non Af Amer: 69 mL/min/{1.73_m2} (ref >59)
GLUCOSE: 94 mg/dL (ref 65–99)
Potassium: 5.1 mmol/L (ref 3.5–5.2)
Sodium: 143 mmol/L (ref 134–144)

## 2016-12-26 LAB — HEMOGLOBIN A1C
ESTIMATED AVERAGE GLUCOSE: 120 mg/dL
HEMOGLOBIN A1C: 5.8 % — AB (ref 4.8–5.6)

## 2016-12-26 NOTE — Telephone Encounter (Signed)
Called pt w/ lab results. May call back w/ additional questions/concerns.

## 2016-12-26 NOTE — Telephone Encounter (Signed)
-----   Message from Melvenia Beam, MD sent at 12/26/2016 10:32 AM EDT ----- Labs unremarkable hgba1c slightly elevated 5.8 thanks

## 2016-12-27 DIAGNOSIS — I639 Cerebral infarction, unspecified: Secondary | ICD-10-CM | POA: Insufficient documentation

## 2016-12-29 NOTE — Telephone Encounter (Signed)
Called pt back and explained that fasting is not necessary for Hgb A1C level as it measures blood sugar levels over a several month period. Glucose level was WNL. May call again if any questions.

## 2016-12-29 NOTE — Telephone Encounter (Signed)
Patient called in reference to lab results patient advised labs good A1C slightly elevated.  Patient would like to know if she was suppose to fast because she did eat and would like to know if she needs to redo them.  Please call

## 2017-01-01 ENCOUNTER — Ambulatory Visit
Admission: RE | Admit: 2017-01-01 | Discharge: 2017-01-01 | Disposition: A | Discharge status: Home / Self Care | Payer: Medicare Other | Source: Ambulatory Visit | Attending: Neurology | Admitting: Neurology

## 2017-01-01 DIAGNOSIS — R2689 Other abnormalities of gait and mobility: Secondary | ICD-10-CM

## 2017-01-01 DIAGNOSIS — I639 Cerebral infarction, unspecified: Secondary | ICD-10-CM

## 2017-01-01 DIAGNOSIS — I63412 Cerebral infarction due to embolism of left middle cerebral artery: Secondary | ICD-10-CM

## 2017-01-01 MED ORDER — IOPAMIDOL (ISOVUE-370) INJECTION 76%
100.0000 mL | Freq: Once | INTRAVENOUS | Status: AC | PRN
  Administered 2017-01-01: 100 mL via INTRAVENOUS

## 2017-01-06 ENCOUNTER — Ambulatory Visit: Payer: Medicare Other | Attending: Internal Medicine

## 2017-01-06 DIAGNOSIS — R2681 Unsteadiness on feet: Secondary | ICD-10-CM | POA: Diagnosis not present

## 2017-01-06 DIAGNOSIS — M6281 Muscle weakness (generalized): Secondary | ICD-10-CM | POA: Insufficient documentation

## 2017-01-06 DIAGNOSIS — R42 Dizziness and giddiness: Secondary | ICD-10-CM | POA: Diagnosis not present

## 2017-01-06 DIAGNOSIS — R2689 Other abnormalities of gait and mobility: Secondary | ICD-10-CM | POA: Diagnosis not present

## 2017-01-06 NOTE — Therapy (Signed)
[facility] [address] [place], [place], [address] Phone: [phone]   Fax:  [phone]  Physical Therapy Evaluation  Patient Details  Name: Sarah Valdez MRN: [id number] Date of Birth: [birth year] Referring Provider: Dr. Jaynee Eagles  Encounter Date: 01/06/2017      PT End of Session - 01/06/17 1100    Visit Number 1   Number of Visits 9   Date for PT Re-Evaluation 02/05/17   Authorization Type Medicare-progress note and G code every 10th visit   PT Start Time 1017   PT Stop Time 1052   PT Time Calculation (min) 35 min   Equipment Utilized During Treatment --  min guard to S prn   Activity Tolerance Patient tolerated treatment well   Behavior During Therapy [facility] for tasks assessed/performed      Past Medical History:  Diagnosis Date  . Allergy   . Cancer ([place])    skin cancer - on back, forehead  . Diverticulosis of colon 2007   Dr Olevia Perches  . History of basal cell carcinoma excision    X3  . History of colon polyps    NON-CANCEROUS  . History of kidney stones   . Hyperlipidemia   . OA (osteoarthritis of spine)   . Palpitations    tx with diltiazem  . PONV (postoperative nausea and vomiting)   . PSVT (paroxysmal supraventricular tachycardia) ([place])   . Right ureteral stone   . Urgency of urination   . Wears glasses     Past Surgical History:  Procedure Laterality Date  . APPENDECTOMY  1950  . 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 CLOSED MANIPULATION POST TOTAL  KNEE ARTHROPLASTY  07-13-2006  . COLONOSCOPY W/ POLYPECTOMY  2002     adenomatous; Dr Olevia Perches  . EXTRACORPOREAL SHOCK WAVE LITHOTRIPSY  X3  . FACELIFT W/BLEPHAROPLASTY  2005  . HYSTEROSCOPY W/D&C  12-22-2000  . OPEN KNEE MENISECTOMY  1964  . RIGHT URETEROSCOPIC STONE EXTRACTION / STENT PLACEMENT  08-21-2008  . rotator cuff on right shoulder    . TONSILLECTOMY  1950   . TOTAL KNEE ARTHROPLASTY Right 05-30-2006  . TUBAL LIGATION    . WISDOM TOOTH EXTRACTION      There were no vitals filed for this visit.       Subjective Assessment - 01/06/17 1023    Subjective Pt presents with balance impairments and hx of falls. MD referral and notes states pt had experienced cryptogenic stroke and is currently being followed by MD to determine if pt is experiencing CVAs or has another medical issue. Pt reports she has the most issues ambulating over uneven surfaces and when she is in a hurry. Pt has fallen 2-3 times in the last 6 months. Pt reports dizziness has been gone for 2-3 weeks but feels it might be coming back. Pt reported dizziness improves when she drinks a Coke.   Pertinent History 08/2016 R rotator cuff repair and s/p OPPT, osteopenia, chronic LBP, scoliosis, LE edema, nocturia, DDD of Lx spine, PSVT, HLD, hx of skin CA, arthritis   Patient Stated Goals Be done with the dizziness, walk over uneven surfaces without falling.    Currently in Pain? Yes   Pain Score 2    Pain Location Back   Pain Orientation Lower   Pain Descriptors / Indicators Aching   Pain Type Chronic pain   Pain Onset More than a month ago  Pain Frequency Intermittent   Aggravating Factors  A lot of walking, heavy lifting   Pain Relieving Factors stretches in the morning             [facility] PT Assessment - 01/06/17 0001      Assessment   Medical Diagnosis Cryptogenic stroke, Imbalance, High risk for injury related to fall, CVA 2/2 embolism of L MCA   Referring Provider Dr. Jaynee Eagles   Onset Date/Surgical Date 11/24/15   Hand Dominance Right   Prior Therapy Pt recently completed PT for R rotator cuff surgical repair.     Precautions   Precautions Fall     Restrictions   Weight Bearing Restrictions No     Balance Screen   Has the patient fallen in the past 6 months Yes   How many times? 2-3   Has the patient had a decrease in activity level because of a fear of falling?   Yes   Is the patient reluctant to leave their home because of a fear of falling?  No     Home Environment   Living Environment Private residence   Living Arrangements Spouse/significant other   Available Help at Discharge Family   Type of [place] to enter   Entrance Stairs-Number of Steps 2   Entrance Stairs-Rails Left  and can reach wall on R side   Home Layout Two level;Able to live on main level with bedroom/bathroom   Alternate Level Stairs-Number of Steps --  Pt does not go upstairs   Home Equipment None     Prior Function   Level of Independence Independent   Vocation Retired   Patent attorney, play bridge, travelling     Charity fundraiser Status Within Functional Limits for tasks assessed     Sensation   Light Touch Appears Intact   Additional Comments No N/T     Posture/Postural Control   Posture/Postural Control No significant limitations     ROM / Strength   AROM / PROM / Strength AROM;Strength     AROM   Overall AROM  Within functional limits for tasks performed   Overall AROM Comments BUE/LE AROM WFL     Strength   Overall Strength Deficits   Overall Strength Comments B UE/LE WFL except for B knee flex: 3+/5 and B hip abd/add: 3+/5 in seated position.      Ambulation/Gait   Ambulation/Gait Yes   Ambulation/Gait Assistance 5: Supervision   Ambulation/Gait Assistance Details S to ensure safety   Ambulation Distance (Feet) 300 Feet   Assistive device None   Gait Pattern Step-through pattern;Decreased stride length;Decreased weight shift to left;Decreased trunk rotation   Ambulation Surface Level;Indoor   Gait velocity 3.48ft/sec.  no AD     Functional Gait  Assessment   Gait assessed  Yes   Gait Level Surface Walks 20 ft in less than 7 sec but greater than 5.5 sec, uses assistive device, slower speed, mild gait deviations, or deviates 6-10 in outside of the 12 in walkway width.   Change in Gait Speed Able to  smoothly change walking speed without loss of balance or gait deviation. Deviate no more than 6 in outside of the 12 in walkway width.   Gait with Horizontal Head Turns Performs head turns smoothly with slight change in gait velocity (eg, minor disruption to smooth gait path), deviates 6-10 in outside 12 in walkway width, or uses an assistive device.   Gait with  Vertical Head Turns Performs task with slight change in gait velocity (eg, minor disruption to smooth gait path), deviates 6 - 10 in outside 12 in walkway width or uses assistive device   Gait and Pivot Turn Pivot turns safely within 3 sec and stops quickly with no loss of balance.   Step Over Obstacle Is able to step over 2 stacked shoe boxes taped together (9 in total height) without changing gait speed. No evidence of imbalance.   Gait with Narrow Base of Support Ambulates less than 4 steps heel to toe or cannot perform without assistance.   Gait with Eyes Closed Walks 20 ft, slow speed, abnormal gait pattern, evidence for imbalance, deviates 10-15 in outside 12 in walkway width. Requires more than 9 sec to ambulate 20 ft.   Ambulating Backwards Walks 20 ft, uses assistive device, slower speed, mild gait deviations, deviates 6-10 in outside 12 in walkway width.   Steps Alternating feet, no rail.   Total Score 21   FGA comment: Indicates pt is at moderate risk for falls.                            PT Education - 01/06/17 1059    Education provided Yes   Education Details PT discussed exam findings, outcome measure results, and PT POC/duration/frequency   Person(s) Educated Patient   Methods Explanation   Comprehension Verbalized understanding          PT Short Term Goals - 01/06/17 1641      PT SHORT TERM GOAL #1   Title Same as LTGs           PT Long Term Goals - 01/06/17 1641      PT LONG TERM GOAL #1   Title Pt will be IND in HEP to improve balance, flexibilty and strength. TARGET DATE FOR ALL  LTGS: 02/03/17   Status New     PT LONG TERM GOAL #2   Title Pt will improve FGA score to >/=29/30 to decr. falls risk.    Status New     PT LONG TERM GOAL #3   Title Pt will participate in SOT testing with LTG to be written   Status New     PT LONG TERM GOAL #4   Title Pt will amb. 1000' over even/uneven terrain, IND, without dizziness to improve functional mobility.    Status New               Plan - 01/06/17 1630    Clinical Impression Statement Pt is a pleasant 71y/o female presenting today s/p cryptogenic stroke and imbalance. Pt's PMH significant for the following impairments: 08/2016 R rotator cuff repair and s/p OPPT, osteopenia, chronic LBP, scoliosis, LE edema, nocturia, DDD of Lx spine, PSVT, HLD, hx of skin CA, arthritis. The following deficits were noted during examination: gait deviations, impaired balance, decreased strength, intermittent dizziness, and impaired flexibility. Pt's gait speed WFL. Pt's FGA indicates pt is at a moderate risk for falls. Pt would benefit from skilled PT to improve safety during functional mobility.    Rehab Potential Good   Clinical Impairments Affecting Rehab Potential Pt currently undergoing testing to determine cause of CVAs   PT Frequency 2x / week   PT Duration 4 weeks   PT Treatment/Interventions ADLs/Self Care Home Management;Canalith Repostioning;Balance training;Neuromuscular re-education;Patient/family education;Vestibular;Therapeutic exercise;Therapeutic activities;Functional mobility training;Stair training;Gait training;DME Instruction;Manual techniques   PT Next Visit Plan Perform SOT and write goal  as indicated. Initiate balance, flexibility, and hip strengthening HEP.   Consulted and Agree with Plan of Care Patient      Patient will benefit from skilled therapeutic intervention in order to improve the following deficits and impairments:  Abnormal gait, Impaired flexibility, Dizziness, Decreased balance, Decreased mobility,  Decreased knowledge of use of DME, Decreased strength  Visit Diagnosis: Other abnormalities of gait and mobility - Plan: PT plan of care cert/re-cert  Muscle weakness (generalized) - Plan: PT plan of care cert/re-cert  Dizziness and giddiness - Plan: PT plan of care cert/re-cert  Unsteadiness on feet - Plan: PT plan of care cert/re-cert      G-Codes - 35/82/51 1644    Functional Assessment Tool Used (Outpatient Only) FGA: 21/30   Functional Limitation Mobility: Walking and moving around   Mobility: Walking and Moving Around Current Status [phone]) At least 20 percent but less than 40 percent impaired, limited or restricted   Mobility: Walking and Moving Around Goal Status [phone]) At least 1 percent but less than 20 percent impaired, limited or restricted       Problem List Patient Active Problem List   Diagnosis Date Noted  . Cryptogenic stroke ([place]) 12/27/2016  . Hepatic steatosis 01/20/2014  . Gallstones 01/20/2014  . Pancreatic cyst 01/20/2014  . Abnormal CT scan, liver 01/02/2014  . Back spasm 06/10/2013  . Osteopenia 09/20/2012  . COUGH VARIANT ASTHMA 02/11/2010  . Diverticulosis of large intestine 12/25/2009  . Wrist osteonecrosis 12/25/2009  . Vitamin D deficiency 11/28/2008  . NEPHROLITHIASIS, HX OF 11/28/2008  . Dyslipidemia 11/24/2007  . Allergic rhinitis, cause unspecified 11/24/2007  . PALPITATIONS 11/24/2007  . DEGENERATIVE DISC DISEASE 01/19/2007  . SKIN CANCER, HX OF 01/19/2007  . COLONIC POLYPS, HX OF 01/19/2007    Rashay Barnette L 01/06/2017, 4:45 PM  [facility] [address] [place], [place], [address] Phone: [phone]   Fax:  [phone]  Name: KHIARA SHUPING MRN: [id number] Date of Birth: 1946/07/15  Geoffry Paradise, PT,DPT 01/06/17 4:49 PM Phone: [phone] Fax: [phone]

## 2017-01-08 ENCOUNTER — Telehealth: Payer: Self-pay

## 2017-01-08 NOTE — Telephone Encounter (Signed)
Called pt w/ unremarkable CT results. Verbalized understanding and appreciation for call.

## 2017-01-08 NOTE — Telephone Encounter (Signed)
-----   Message from Melvenia Beam, MD sent at 01/01/2017  6:02 PM EDT ----- No intracranial or extracranial stenosis or occlusion of significance.

## 2017-01-09 ENCOUNTER — Encounter: Payer: Medicare Other | Admitting: Physical Therapy

## 2017-01-12 ENCOUNTER — Other Ambulatory Visit: Payer: Self-pay | Admitting: Neurology

## 2017-01-12 ENCOUNTER — Other Ambulatory Visit: Payer: Self-pay

## 2017-01-12 ENCOUNTER — Ambulatory Visit (HOSPITAL_COMMUNITY): Payer: Medicare Other | Attending: Cardiology

## 2017-01-12 ENCOUNTER — Ambulatory Visit (INDEPENDENT_AMBULATORY_CARE_PROVIDER_SITE_OTHER): Payer: Medicare Other

## 2017-01-12 DIAGNOSIS — Z6827 Body mass index (BMI) 27.0-27.9, adult: Secondary | ICD-10-CM | POA: Diagnosis not present

## 2017-01-12 DIAGNOSIS — J189 Pneumonia, unspecified organism: Secondary | ICD-10-CM | POA: Diagnosis not present

## 2017-01-12 DIAGNOSIS — I639 Cerebral infarction, unspecified: Secondary | ICD-10-CM | POA: Diagnosis not present

## 2017-01-12 DIAGNOSIS — I63412 Cerebral infarction due to embolism of left middle cerebral artery: Secondary | ICD-10-CM

## 2017-01-12 DIAGNOSIS — I4891 Unspecified atrial fibrillation: Secondary | ICD-10-CM

## 2017-01-12 DIAGNOSIS — R05 Cough: Secondary | ICD-10-CM | POA: Diagnosis not present

## 2017-01-12 DIAGNOSIS — J302 Other seasonal allergic rhinitis: Secondary | ICD-10-CM | POA: Diagnosis not present

## 2017-01-12 MED ORDER — PERFLUTREN LIPID MICROSPHERE
1.0000 mL | INTRAVENOUS | Status: AC | PRN
  Administered 2017-01-12: 2 mL via INTRAVENOUS

## 2017-01-21 ENCOUNTER — Ambulatory Visit: Payer: Medicare Other

## 2017-01-21 DIAGNOSIS — R2681 Unsteadiness on feet: Secondary | ICD-10-CM | POA: Diagnosis not present

## 2017-01-21 DIAGNOSIS — H52223 Regular astigmatism, bilateral: Secondary | ICD-10-CM | POA: Diagnosis not present

## 2017-01-21 DIAGNOSIS — H353122 Nonexudative age-related macular degeneration, left eye, intermediate dry stage: Secondary | ICD-10-CM | POA: Diagnosis not present

## 2017-01-21 DIAGNOSIS — R42 Dizziness and giddiness: Secondary | ICD-10-CM

## 2017-01-21 DIAGNOSIS — H04122 Dry eye syndrome of left lacrimal gland: Secondary | ICD-10-CM | POA: Diagnosis not present

## 2017-01-21 DIAGNOSIS — H25813 Combined forms of age-related cataract, bilateral: Secondary | ICD-10-CM | POA: Diagnosis not present

## 2017-01-21 DIAGNOSIS — H5203 Hypermetropia, bilateral: Secondary | ICD-10-CM | POA: Diagnosis not present

## 2017-01-21 DIAGNOSIS — M6281 Muscle weakness (generalized): Secondary | ICD-10-CM | POA: Diagnosis not present

## 2017-01-21 DIAGNOSIS — R2689 Other abnormalities of gait and mobility: Secondary | ICD-10-CM | POA: Diagnosis not present

## 2017-01-21 DIAGNOSIS — H524 Presbyopia: Secondary | ICD-10-CM | POA: Diagnosis not present

## 2017-01-21 DIAGNOSIS — H43813 Vitreous degeneration, bilateral: Secondary | ICD-10-CM | POA: Diagnosis not present

## 2017-01-21 NOTE — Patient Instructions (Addendum)
Perform in corner with chair in front of you OR at kitchen sink for safety:  Feet Together (Compliant Surface) Head Motion - Eyes Closed    Stand on compliant surface: ___pillows/cushion_____ with feet together. Close eyes and move head slowly, up and down 5 times and side to side 5 times. Repeat __3__ times per session. Do __1__ sessions per day.  Copyright  VHI. All rights reserved.  Single Leg (Compliant Surface) - Eyes Open    Stand on compliant surface: __pillows/cushion______ holding support. Lift right leg while maintaining balance over other leg. Progress to removing hands from support surface for longer periods of time. Repeat with other leg. Hold_10-30___ seconds. Repeat __3__ times per session. Do __1__ sessions per day.  Copyright  VHI. All rights reserved.  Feet Heel-Toe "Tandem" (Compliant Surface) Varied Arm Positions - Eyes Open    With eyes open, standing on compliant surface: __pillows/cushion______, right foot directly in front of the other and arms at your side, look at a stationary object. Hold __30__ seconds. Repeat with other foot in front. Repeat __3__ times per session. Do __1__ sessions per day.  Copyright  VHI. All rights reserved.  Up / Down Head Motion    Perform at counter. Walking 10 feet on solid surface, move head and eyes toward ceiling for __2__ steps.  Then, move head and eyes toward floor for __2__ steps. Repeat _4___ times per session. Do __1__ sessions per day.   Copyright  VHI. All rights reserved.  Side to Side Head Motion    Perform at counter. Walking 10 feet on solid surface, turn head and eyes to left for __2__ steps. Then, turn head and eyes to opposite side for __2__ steps. Repeat sequence __4__ times per session. Do __1__ sessions per day.   Copyright  VHI. All rights reserved.

## 2017-01-21 NOTE — Therapy (Signed)
[facility] [address] [place], [place], [address] Phone: [phone]   Fax:  [phone]  Physical Therapy Treatment  Patient Details  Name: Sarah Valdez MRN: [id number] Date of Birth: [birth year] Referring Provider: Dr. Jaynee Eagles  Encounter Date: 01/21/2017      PT End of Session - 01/21/17 1006    Visit Number 2   Number of Visits 9   Date for PT Re-Evaluation 02/05/17   Authorization Type Medicare-progress note and G code every 10th visit   PT Start Time 0805   PT Stop Time 0845   PT Time Calculation (min) 40 min   Equipment Utilized During Treatment --  SOT harness and S prn   Activity Tolerance Patient tolerated treatment well   Behavior During Therapy [facility] for tasks assessed/performed      Past Medical History:  Diagnosis Date  . Allergy   . Cancer ([place])    skin cancer - on back, forehead  . Diverticulosis of colon 2007   Dr Olevia Perches  . History of basal cell carcinoma excision    X3  . History of colon polyps    NON-CANCEROUS  . History of kidney stones   . Hyperlipidemia   . OA (osteoarthritis of spine)   . Palpitations    tx with diltiazem  . PONV (postoperative nausea and vomiting)   . PSVT (paroxysmal supraventricular tachycardia) ([place])   . Right ureteral stone   . Urgency of urination   . Wears glasses     Past Surgical History:  Procedure Laterality Date  . APPENDECTOMY  1950  . 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 CLOSED MANIPULATION POST TOTAL  KNEE ARTHROPLASTY  07-13-2006  . COLONOSCOPY W/ POLYPECTOMY  2002     adenomatous; Dr Olevia Perches  . EXTRACORPOREAL SHOCK WAVE LITHOTRIPSY  X3  . FACELIFT W/BLEPHAROPLASTY  2005  . HYSTEROSCOPY W/D&C  12-22-2000  . OPEN KNEE MENISECTOMY  1964  . RIGHT URETEROSCOPIC STONE EXTRACTION / STENT PLACEMENT  08-21-2008  . rotator cuff on right shoulder    . TONSILLECTOMY  1950   . TOTAL KNEE ARTHROPLASTY Right 05-30-2006  . TUBAL LIGATION    . WISDOM TOOTH EXTRACTION      There were no vitals filed for this visit.      Subjective Assessment - 01/21/17 0807    Subjective (P)  Pt reports she has PNA but is feeling better. Pt had 30 day heart monitor in place but it was not working so she is waiting for a new one.    Pertinent History (P)  08/2016 R rotator cuff repair and s/p OPPT, osteopenia, chronic LBP, scoliosis, LE edema, nocturia, DDD of Lx spine, PSVT, HLD, hx of skin CA, arthritis   Patient Stated Goals (P)  Be done with the dizziness   Currently in Pain? (P)  No/denies          Neuro re-ed: Neuro re-ed: sensory organization test performed with following results: Conditions: 1: WNL 2: WNL 3: WNL  4: WNL 5: WNL 6: WNL Composite score: ~76 Sensory Analysis Som: WNL Vis: WNL Vest: WNL Pref: WNL Strategy analysis: Hip/ankle strategy (mainly ankle) reduced during conditions which incr. Postural sway (and incr. Vestibular input).       COG alignment: Anterior with L bias                      [facility]  Adult PT Treatment/Exercise - 01/21/17 1006      High Level Balance   High Level Balance Activities Head turns   High Level Balance Comments Pt performed vertical/horizontal head turns at counter with intermittent 1 UE support 4x7'/activity with cues and demo for technique. S for safety. Please see pt istructions for details.              Balance Exercises - 01/21/17 1005      Balance Exercises: Standing   Standing Eyes Opened Narrow base of support (BOS);Wide (BOA);Head turns;Foam/compliant surface;Solid surface;3 reps;10 secs;30 secs   Standing Eyes Closed Narrow base of support (BOS);Wide (BOA);Head turns;Foam/compliant surface;Solid surface;3 reps;10 secs;30 secs   Other Standing Exercises Performed in corner with chair in front of pt with S for safety. Cues and demo for technique. Please see pt insructions for HEP details.             PT Education - 01/21/17 1005    Education provided Yes   Education Details PT discussed SOT findings and provided pt with balance HEP.   Person(s) Educated Patient   Methods Explanation;Demonstration;Verbal cues;Handout   Comprehension Returned demonstration;Verbalized understanding          PT Short Term Goals - 01/06/17 1641      PT SHORT TERM GOAL #1   Title Same as LTGs           PT Long Term Goals - 01/21/17 1010      PT LONG TERM GOAL #1   Title Pt will be IND in HEP to improve balance, flexibilty and strength. TARGET DATE FOR ALL LTGS: 02/03/17   Status New     PT LONG TERM GOAL #2   Title Pt will improve FGA score to >/=29/30 to decr. falls risk.    Status New     PT LONG TERM GOAL #3   Title Pt will participate in SOT testing with LTG to be written   Baseline WNL for 6-79 age group   Status Deferred     PT LONG TERM GOAL #4   Title Pt will amb. 1000' over even/uneven terrain, IND, without dizziness to improve functional mobility.    Status New               Plan - 01/21/17 1007    Clinical Impression Statement Pt's SOT score was WNL, as was sensory analysis. However, pt experienced increased postural sway and decr. hip/ankle (mainly ankle activation) during conditions which required incr. vestibular input. Based on that and FGA score, PT provided pt with balance HEP. Pt would also benefit from ankle/hip strategy training. Continue with POC.    Rehab Potential Good   Clinical Impairments Affecting Rehab Potential Pt currently undergoing testing to determine cause of CVAs   PT Frequency 2x / week   PT Duration 4 weeks   PT Treatment/Interventions ADLs/Self Care Home Management;Canalith Repostioning;Balance training;Neuromuscular re-education;Patient/family education;Vestibular;Therapeutic exercise;Therapeutic activities;Functional mobility training;Stair training;Gait training;DME Instruction;Manual techniques   PT Next Visit Plan   Initiate flexibility and hip strengthening HEP. External pertubations   Consulted and Agree with Plan of Care Patient      Patient will benefit from skilled therapeutic intervention in order to improve the following deficits and impairments:  Abnormal gait, Impaired flexibility, Dizziness, Decreased balance, Decreased mobility, Decreased knowledge of use of DME, Decreased strength  Visit Diagnosis: Other abnormalities of gait and mobility  Dizziness and giddiness  Unsteadiness on feet     Problem List Patient Active Problem List  Diagnosis Date Noted  . Cryptogenic stroke ([place]) 12/27/2016  . Hepatic steatosis 01/20/2014  . Gallstones 01/20/2014  . Pancreatic cyst 01/20/2014  . Abnormal CT scan, liver 01/02/2014  . Back spasm 06/10/2013  . Osteopenia 09/20/2012  . COUGH VARIANT ASTHMA 02/11/2010  . Diverticulosis of large intestine 12/25/2009  . Wrist osteonecrosis 12/25/2009  . Vitamin D deficiency 11/28/2008  . NEPHROLITHIASIS, HX OF 11/28/2008  . Dyslipidemia 11/24/2007  . Allergic rhinitis, cause unspecified 11/24/2007  . PALPITATIONS 11/24/2007  . DEGENERATIVE DISC DISEASE 01/19/2007  . SKIN CANCER, HX OF 01/19/2007  . COLONIC POLYPS, HX OF 01/19/2007    Emmersyn Kratzke L 01/21/2017, 10:10 AM  [facility] [address] [place], [place], [address] Phone: [phone]   Fax:  [phone]  Name: Sarah Valdez MRN: [id number] Date of Birth: 09-Aug-1946  Geoffry Paradise, PT,DPT 01/21/17 10:13 AM Phone: [phone] Fax: [phone]

## 2017-01-22 ENCOUNTER — Encounter: Payer: Self-pay | Admitting: Physical Therapy

## 2017-01-22 ENCOUNTER — Telehealth: Payer: Self-pay | Admitting: *Deleted

## 2017-01-22 ENCOUNTER — Ambulatory Visit: Payer: Medicare Other | Admitting: Physical Therapy

## 2017-01-22 DIAGNOSIS — R2681 Unsteadiness on feet: Secondary | ICD-10-CM

## 2017-01-22 DIAGNOSIS — R2689 Other abnormalities of gait and mobility: Secondary | ICD-10-CM | POA: Diagnosis not present

## 2017-01-22 DIAGNOSIS — M6281 Muscle weakness (generalized): Secondary | ICD-10-CM | POA: Diagnosis not present

## 2017-01-22 DIAGNOSIS — R42 Dizziness and giddiness: Secondary | ICD-10-CM | POA: Diagnosis not present

## 2017-01-22 NOTE — Therapy (Signed)
[facility] [address] [place], [place], [address] Phone: [phone]   Fax:  [phone]  Physical Therapy Treatment  Patient Details  Name: Sarah Valdez MRN: [id number] Date of Birth: July 31, 1946 Referring Provider: Dr. Jaynee Eagles  Encounter Date: 01/22/2017      PT End of Session - 01/22/17 1623    Visit Number 3   Number of Visits 9   Date for PT Re-Evaluation 02/05/17   Authorization Type Medicare-progress note and G code every 10th visit   PT Start Time 1535   PT Stop Time 1616   PT Time Calculation (min) 41 min   Activity Tolerance Patient tolerated treatment well   Behavior During Therapy [facility] for tasks assessed/performed      Past Medical History:  Diagnosis Date  . Allergy   . Cancer ([place])    skin cancer - on back, forehead  . Diverticulosis of colon 2007   Dr Olevia Perches  . History of basal cell carcinoma excision    X3  . History of colon polyps    NON-CANCEROUS  . History of kidney stones   . Hyperlipidemia   . OA (osteoarthritis of spine)   . Palpitations    tx with diltiazem  . PONV (postoperative nausea and vomiting)   . PSVT (paroxysmal supraventricular tachycardia) ([place])   . Right ureteral stone   . Urgency of urination   . Wears glasses     Past Surgical History:  Procedure Laterality Date  . APPENDECTOMY  1950  . 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 CLOSED MANIPULATION POST TOTAL  KNEE ARTHROPLASTY  07-13-2006  . COLONOSCOPY W/ POLYPECTOMY  2002     adenomatous; Dr Olevia Perches  . EXTRACORPOREAL SHOCK WAVE LITHOTRIPSY  X3  . FACELIFT W/BLEPHAROPLASTY  2005  . HYSTEROSCOPY W/D&C  12-22-2000  . OPEN KNEE MENISECTOMY  1964  . RIGHT URETEROSCOPIC STONE EXTRACTION / STENT PLACEMENT  08-21-2008  . rotator cuff on right shoulder    . TONSILLECTOMY  1950  . TOTAL KNEE ARTHROPLASTY Right 05-30-2006  . TUBAL LIGATION     . WISDOM TOOTH EXTRACTION      There were no vitals filed for this visit.      Subjective Assessment - 01/22/17 1542    Subjective Pt is now wearing new heart monitor secondary to first one not functioning.  Per MD and instructions on heart monitor: limit walking to <15 minutes, avoid strenuous aerobic activity, lifting/weights, exercise machines and classes, pilates.  Reports continuing to trip over things on the ground and falling forwards.   Pertinent History 08/2016 R rotator cuff repair and s/p OPPT, osteopenia, chronic LBP, scoliosis, LE edema, nocturia, DDD of Lx spine, PSVT, HLD, hx of skin CA, arthritis   Patient Stated Goals Be done with the dizziness   Currently in Pain? No/denies                Balance Exercises - 01/22/17 1621      Balance Exercises: Standing   Stepping Strategy Anterior;Posterior;10 reps  EO, EC   Rockerboard Anterior/posterior;Lateral;Head turns;EO;EC;Intermittent UE support  head turns, nods, diagonals x 10   Step Ups Forward;Lateral;6 inch  no UE support, EO, 10 reps each     OTAGO PROGRAM   Heel Walking No support  x 4 forwards, retro   Toe Walk No support  x 4 forwards and retro  PT Education - 01/21/17 1005    Education provided Yes   Education Details PT discussed SOT findings and provided pt with balance HEP.   Person(s) Educated Patient   Methods Explanation;Demonstration;Verbal cues;Handout   Comprehension Returned demonstration;Verbalized understanding          PT Short Term Goals - 01/06/17 1641      PT SHORT TERM GOAL #1   Title Same as LTGs           PT Long Term Goals - 01/21/17 1010      PT LONG TERM GOAL #1   Title Pt will be IND in HEP to improve balance, flexibilty and strength. TARGET DATE FOR ALL LTGS: 02/03/17   Status New     PT LONG TERM GOAL #2   Title Pt will improve FGA score to >/=29/30 to decr. falls risk.    Status New     PT LONG TERM GOAL #3   Title Pt will participate  in SOT testing with LTG to be written   Baseline WNL for 26-79 age group   Status Deferred     PT LONG TERM GOAL #4   Title Pt will amb. 1000' over even/uneven terrain, IND, without dizziness to improve functional mobility.    Status New               Plan - 01/22/17 1623    Clinical Impression Statement Continued to focus on balance training with specific focus on ankle, hip, and stepping strategies and strengthening of ankles due to pt reporting catching feet on objects on ground.  Monitored pt's exertion during each activity due to restrictions set by MD.  Pt continues to have increased difficulty with proprioception and postural control with vision removed; will continue to address and progress as able.   Rehab Potential Good   Clinical Impairments Affecting Rehab Potential Pt currently undergoing testing to determine cause of CVAs   PT Frequency 2x / week   PT Duration 4 weeks   PT Treatment/Interventions ADLs/Self Care Home Management;Canalith Repostioning;Balance training;Neuromuscular re-education;Patient/family education;Vestibular;Therapeutic exercise;Therapeutic activities;Functional mobility training;Stair training;Gait training;DME Instruction;Manual techniques   PT Next Visit Plan continue to focus on balance strategies, use of vestibular system with narrow BOS/compliant surfaces and decreased use of vision, ankle strength and foot clearance   Consulted and Agree with Plan of Care Patient      Patient will benefit from skilled therapeutic intervention in order to improve the following deficits and impairments:  Abnormal gait, Impaired flexibility, Dizziness, Decreased balance, Decreased mobility, Decreased knowledge of use of DME, Decreased strength  Visit Diagnosis: Other abnormalities of gait and mobility  Dizziness and giddiness  Unsteadiness on feet  Muscle weakness (generalized)     Problem List Patient Active Problem List   Diagnosis Date Noted  .  Cryptogenic stroke ([place]) 12/27/2016  . Hepatic steatosis 01/20/2014  . Gallstones 01/20/2014  . Pancreatic cyst 01/20/2014  . Abnormal CT scan, liver 01/02/2014  . Back spasm 06/10/2013  . Osteopenia 09/20/2012  . COUGH VARIANT ASTHMA 02/11/2010  . Diverticulosis of large intestine 12/25/2009  . Wrist osteonecrosis 12/25/2009  . Vitamin D deficiency 11/28/2008  . NEPHROLITHIASIS, HX OF 11/28/2008  . Dyslipidemia 11/24/2007  . Allergic rhinitis, cause unspecified 11/24/2007  . PALPITATIONS 11/24/2007  . DEGENERATIVE DISC DISEASE 01/19/2007  . SKIN CANCER, HX OF 01/19/2007  . COLONIC POLYPS, HX OF 01/19/2007   Raylene Everts, PT, DPT 01/22/17    4:30 PM    [facility] [address] [place], [place], [address] Phone: [phone]   Fax:  [phone]  Name: CRISTLE JARED MRN: [id number] Date of Birth: 03/03/1946

## 2017-01-22 NOTE — Telephone Encounter (Signed)
Spoke with patient and informed her that per Dr Jaynee Eagles, her echocardiogram bubble study showed no cardiac source of emboli indentified. Advised her she has some mild diatolic dysfunction (stiffening of the heart seen in HTN), and she should follow with her primary care for this. Advised her per Dr Jaynee Eagles, this is not urgent and is commonly seen in hypertension. Otherwise her heart looks great.  Patient stated she has never had high blood pressure. She is asking for further explanation of her bubble study results from Dr Jaynee Eagles since hypertension is not a problem for her. This RN advised will route her request to Dr Jaynee Eagles, and she will get a call back today or tomorrow. Patient verbalized understanding, appreciation.

## 2017-01-23 ENCOUNTER — Ambulatory Visit: Payer: Medicare Other | Admitting: Physical Therapy

## 2017-01-27 ENCOUNTER — Ambulatory Visit: Payer: Medicare Other | Attending: Internal Medicine

## 2017-01-27 DIAGNOSIS — R2681 Unsteadiness on feet: Secondary | ICD-10-CM | POA: Insufficient documentation

## 2017-01-27 DIAGNOSIS — R2689 Other abnormalities of gait and mobility: Secondary | ICD-10-CM | POA: Insufficient documentation

## 2017-01-27 DIAGNOSIS — M6281 Muscle weakness (generalized): Secondary | ICD-10-CM | POA: Diagnosis not present

## 2017-01-27 DIAGNOSIS — R42 Dizziness and giddiness: Secondary | ICD-10-CM

## 2017-01-27 NOTE — Therapy (Signed)
[facility] [address] [place], [place], [address] Phone: [phone]   Fax:  [phone]  Physical Therapy Treatment  Patient Details  Name: Sarah Valdez MRN: [id number] Date of Birth: 1946-02-08 Referring Provider: Dr. Jaynee Eagles  Encounter Date: 01/27/2017      PT End of Session - 01/27/17 1055    Visit Number 4   Number of Visits 9   Date for PT Re-Evaluation 02/05/17   Authorization Type Medicare-progress note and G code every 10th visit   PT Start Time 0847   PT Stop Time 0927   PT Time Calculation (min) 40 min   Equipment Utilized During Treatment --  min guard to S   Activity Tolerance Patient tolerated treatment well   Behavior During Therapy [facility] for tasks assessed/performed      Past Medical History:  Diagnosis Date  . Allergy   . Cancer ([place])    skin cancer - on back, forehead  . Diverticulosis of colon 2007   Dr Olevia Perches  . History of basal cell carcinoma excision    X3  . History of colon polyps    NON-CANCEROUS  . History of kidney stones   . Hyperlipidemia   . OA (osteoarthritis of spine)   . Palpitations    tx with diltiazem  . PONV (postoperative nausea and vomiting)   . PSVT (paroxysmal supraventricular tachycardia) ([place])   . Right ureteral stone   . Urgency of urination   . Wears glasses     Past Surgical History:  Procedure Laterality Date  . APPENDECTOMY  1950  . 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 CLOSED MANIPULATION POST TOTAL  KNEE ARTHROPLASTY  07-13-2006  . COLONOSCOPY W/ POLYPECTOMY  2002     adenomatous; Dr Olevia Perches  . EXTRACORPOREAL SHOCK WAVE LITHOTRIPSY  X3  . FACELIFT W/BLEPHAROPLASTY  2005  . HYSTEROSCOPY W/D&C  12-22-2000  . OPEN KNEE MENISECTOMY  1964  . RIGHT URETEROSCOPIC STONE EXTRACTION / STENT PLACEMENT  08-21-2008  . rotator cuff on right shoulder    . TONSILLECTOMY  1950  .  TOTAL KNEE ARTHROPLASTY Right 05-30-2006  . TUBAL LIGATION    . WISDOM TOOTH EXTRACTION      There were no vitals filed for this visit.      Subjective Assessment - 01/27/17 0849    Subjective Pt reported her second heart monitor is no longer working, so the company is going to send her a new system. Pt denied falls since last visit. Pt reported dizziness is much better, and no dizziness at rest.    Pertinent History 08/2016 R rotator cuff repair and s/p OPPT, osteopenia, chronic LBP, scoliosis, LE edema, nocturia, DDD of Lx spine, PSVT, HLD, hx of skin CA, arthritis   Patient Stated Goals Be done with the dizziness   Currently in Pain? No/denies                         [facility] Adult PT Treatment/Exercise - 01/27/17 0915      Ambulation/Gait   Ambulation/Gait Yes   Ambulation/Gait Assistance 4: Min guard;5: Supervision   Ambulation/Gait Assistance Details Neuro re-ed: Pt amb. while performing head turns over uneven terrain. Cues to improve narrow BOS and heel strike. Pt reported slight dizziness during head turns during amb. over grass.   Ambulation Distance (Feet) 500 Feet   Assistive device None   Gait  Pattern Step-through pattern;Decreased stride length;Decreased weight shift to left;Decreased trunk rotation;Narrow base of support   Ambulation Surface Level;Unlevel;Indoor;Outdoor;Paved;Grass;Other (comment)  rubber mulch     High Level Balance   High Level Balance Activities Head turns;Other (comment);Backward walking  forward amb. with and without eyes closed   High Level Balance Comments pt performed over red and blue mats with min A to S 4x15'/activity. Cues for technique.      Therex: Pt performed ankle HEP with cues and demo for technique. Please see pt instructions for details.          PT Education - 01/27/17 1054    Education provided Yes   Education Details PT provided pt with ankle HEP to improve strength.    Person(s) Educated Patient    Methods Explanation;Demonstration;Tactile cues;Verbal cues;Handout   Comprehension Returned demonstration;Verbalized understanding          PT Short Term Goals - 01/06/17 1641      PT SHORT TERM GOAL #1   Title Same as LTGs           PT Long Term Goals - 01/21/17 1010      PT LONG TERM GOAL #1   Title Pt will be IND in HEP to improve balance, flexibilty and strength. TARGET DATE FOR ALL LTGS: 02/03/17   Status New     PT LONG TERM GOAL #2   Title Pt will improve FGA score to >/=29/30 to decr. falls risk.    Status New     PT LONG TERM GOAL #3   Title Pt will participate in SOT testing with LTG to be written   Baseline WNL for 23-79 age group   Status Deferred     PT LONG TERM GOAL #4   Title Pt will amb. 1000' over even/uneven terrain, IND, without dizziness to improve functional mobility.    Status New               Plan - 01/27/17 1055    Clinical Impression Statement PT assessed pt B plantarflexion strength: 3/5. PT provided pt with ankle strengthening exercises to improved strength, in order to improve gait and balance issues. Pt experienced incr. postural sway during activities over compliant surfaces, mostly duirng activities which required incr. vestibular input. Continue with POC.    Rehab Potential Good   Clinical Impairments Affecting Rehab Potential Pt currently undergoing testing to determine cause of CVAs   PT Frequency 2x / week   PT Duration 4 weeks   PT Treatment/Interventions ADLs/Self Care Home Management;Canalith Repostioning;Balance training;Neuromuscular re-education;Patient/family education;Vestibular;Therapeutic exercise;Therapeutic activities;Functional mobility training;Stair training;Gait training;DME Instruction;Manual techniques   PT Next Visit Plan continue to focus on balance strategies, use of vestibular system with narrow BOS/compliant surfaces and decreased use of vision, ankle strength and foot clearance   Consulted and Agree with  Plan of Care Patient      Patient will benefit from skilled therapeutic intervention in order to improve the following deficits and impairments:  Abnormal gait, Impaired flexibility, Dizziness, Decreased balance, Decreased mobility, Decreased knowledge of use of DME, Decreased strength  Visit Diagnosis: Other abnormalities of gait and mobility  Muscle weakness (generalized)  Dizziness and giddiness  Unsteadiness on feet     Problem List Patient Active Problem List   Diagnosis Date Noted  . Cryptogenic stroke ([place]) 12/27/2016  . Hepatic steatosis 01/20/2014  . Gallstones 01/20/2014  . Pancreatic cyst 01/20/2014  . Abnormal CT scan, liver 01/02/2014  . Back spasm 06/10/2013  . Osteopenia 09/20/2012  .  COUGH VARIANT ASTHMA 02/11/2010  . Diverticulosis of large intestine 12/25/2009  . Wrist osteonecrosis 12/25/2009  . Vitamin D deficiency 11/28/2008  . NEPHROLITHIASIS, HX OF 11/28/2008  . Dyslipidemia 11/24/2007  . Allergic rhinitis, cause unspecified 11/24/2007  . PALPITATIONS 11/24/2007  . DEGENERATIVE DISC DISEASE 01/19/2007  . SKIN CANCER, HX OF 01/19/2007  . COLONIC POLYPS, HX OF 01/19/2007    Sarah Valdez 01/27/2017, 11:00 AM  [facility] [address] [place], [place], [address] Phone: [phone]   Fax:  [phone]  Name: Sarah Valdez MRN: [id number] Date of Birth: 06-Jun-1946  Geoffry Paradise, PT,DPT 01/27/17 11:00 AM Phone: [phone] Fax: [phone]

## 2017-01-27 NOTE — Patient Instructions (Signed)
ANKLE: Eversion, Unilateral (Band)    Place green band around feet. Keeping heel in place, raise toes of banded foot up and away from body. Do not move hip/knee. Hold __2_ seconds. Use __green______ band. _10__ reps per set, __3_ sets per day, __3_ days per week  Copyright  VHI. All rights reserved.   ANKLE: Dorsiflexion (Band)    Sit at edge of surface. Place band around top of foot. Keeping heel on floor, raise toes of banded foot. Hold _2__ seconds. Use ___blue_____ band. _10__ reps per set, __3_ sets per day, _3__ days per week  Copyright  VHI. All rights reserved.   ANKLE: Plantarflexion, Unilateral - Standing    Hold onto counter for safety. Stand on leg. Raise heel up as high as possible 10 times with knee straight, repeat 10 times while bending knee. Perform  _2__ sets per day, _3__ days per week. Copyright  VHI. All rights reserved.   Walking on Heels    Walk on heels for _10__ feet while continuing on a straight path. Repeat 4 times. Do _3__ sessions per week.  Copyright  VHI. All rights reserved.

## 2017-01-28 DIAGNOSIS — R05 Cough: Secondary | ICD-10-CM | POA: Diagnosis not present

## 2017-01-28 DIAGNOSIS — J189 Pneumonia, unspecified organism: Secondary | ICD-10-CM | POA: Diagnosis not present

## 2017-01-30 ENCOUNTER — Encounter: Payer: Self-pay | Admitting: Physical Therapy

## 2017-01-30 ENCOUNTER — Telehealth: Payer: Self-pay | Admitting: *Deleted

## 2017-01-30 ENCOUNTER — Ambulatory Visit: Payer: Medicare Other | Admitting: Physical Therapy

## 2017-01-30 DIAGNOSIS — R2681 Unsteadiness on feet: Secondary | ICD-10-CM | POA: Diagnosis not present

## 2017-01-30 DIAGNOSIS — R42 Dizziness and giddiness: Secondary | ICD-10-CM

## 2017-01-30 DIAGNOSIS — M6281 Muscle weakness (generalized): Secondary | ICD-10-CM | POA: Diagnosis not present

## 2017-01-30 DIAGNOSIS — R2689 Other abnormalities of gait and mobility: Secondary | ICD-10-CM | POA: Diagnosis not present

## 2017-01-30 NOTE — Therapy (Signed)
[facility] [address] [place], [place], [address] Phone: [phone]   Fax:  [phone]  Physical Therapy Treatment  Patient Details  Name: Sarah Valdez MRN: [id number] Date of Birth: 28-Sep-1945 Referring Provider: Dr. Jaynee Eagles  Encounter Date: 01/30/2017      PT End of Session - 01/30/17 1736    Visit Number 5   Number of Visits 9   Date for PT Re-Evaluation 02/05/17   Authorization Type Medicare-progress note and G code every 10th visit   PT Start Time 1404   PT Stop Time 1447   PT Time Calculation (min) 43 min   Equipment Utilized During Treatment --  min guard to S   Activity Tolerance Patient tolerated treatment well   Behavior During Therapy [facility] for tasks assessed/performed      Past Medical History:  Diagnosis Date  . Allergy   . Cancer ([place])    skin cancer - on back, forehead  . Diverticulosis of colon 2007   Dr Olevia Perches  . History of basal cell carcinoma excision    X3  . History of colon polyps    NON-CANCEROUS  . History of kidney stones   . Hyperlipidemia   . OA (osteoarthritis of spine)   . Palpitations    tx with diltiazem  . PONV (postoperative nausea and vomiting)   . PSVT (paroxysmal supraventricular tachycardia) ([place])   . Right ureteral stone   . Urgency of urination   . Wears glasses     Past Surgical History:  Procedure Laterality Date  . APPENDECTOMY  1950  . 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 CLOSED MANIPULATION POST TOTAL  KNEE ARTHROPLASTY  07-13-2006  . COLONOSCOPY W/ POLYPECTOMY  2002     adenomatous; Dr Olevia Perches  . EXTRACORPOREAL SHOCK WAVE LITHOTRIPSY  X3  . FACELIFT W/BLEPHAROPLASTY  2005  . HYSTEROSCOPY W/D&C  12-22-2000  . OPEN KNEE MENISECTOMY  1964  . RIGHT URETEROSCOPIC STONE EXTRACTION / STENT PLACEMENT  08-21-2008  . rotator cuff on right shoulder    . TONSILLECTOMY  1950  .  TOTAL KNEE ARTHROPLASTY Right 05-30-2006  . TUBAL LIGATION    . WISDOM TOOTH EXTRACTION      There were no vitals filed for this visit.      Subjective Assessment - 01/30/17 1412    Subjective Pt has new heart monitor on today; reports improvement in balance and mm strength and an improved ability to lift toes during gait.   Pertinent History 08/2016 R rotator cuff repair and s/p OPPT, osteopenia, chronic LBP, scoliosis, LE edema, nocturia, DDD of Lx spine, PSVT, HLD, hx of skin CA, arthritis   Patient Stated Goals Be done with the dizziness   Currently in Pain? No/denies                         [facility] Adult PT Treatment/Exercise - 01/30/17 0001      Exercises   Exercises Knee/Hip     Knee/Hip Exercises: Standing   Wall Squat 4 sets;Other (comment)  12 seconds hold             Balance Exercises - 01/30/17 1413      Balance Exercises: Standing   SLS Eyes open;Solid surface;Intermittent upper extremity support  on balance beam with closed chain hip ABD drops x 10 R/L   Balance Beam tandem gait down and back  x 3 reps while performing alternating heel taps to floor; lateral stepping to L and R x 3 reps with R or LLE heel taps to floor in front of beam; side stepping x 2 with R and LLE heel taps to front, toe taps to back   Marching Limitations x 25' x 2 reps with lateral head turns and min A     OTAGO PROGRAM   Heel Walking No support  vertical head turns; 25' x 2   Toe Walk No support  diagonal head turns, 25' x 2           PT Education - 01/30/17 1735    Education provided Yes   Education Details plan for reassessment of LTG next week and decide recertify vs d/c   Person(s) Educated Patient   Methods Explanation   Comprehension Verbalized understanding          PT Short Term Goals - 01/06/17 1641      PT SHORT TERM GOAL #1   Title Same as LTGs           PT Long Term Goals - 01/30/17 1736      PT LONG TERM GOAL #1   Title Pt will  be IND in HEP to improve balance, flexibilty and strength. TARGET DATE FOR ALL LTGS: 02/05/17   Status On-going     PT LONG TERM GOAL #2   Title Pt will improve FGA score to >/=29/30 to decr. falls risk.    Status On-going     PT LONG TERM GOAL #3   Title Pt will participate in SOT testing with LTG to be written   Baseline WNL for 1-79 age group   Status Deferred     PT LONG TERM GOAL #4   Title Pt will amb. 1000' over even/uneven terrain, IND, without dizziness to improve functional mobility.    Status On-going               Plan - 01/30/17 1737    Clinical Impression Statement Treatment session continue to focus on functional ankle strengthening, hip strengthening and ankle/hip balance strategy training.  Incorporated head turns (to decrease use of visual information for balance) into higher level gait challenges with pt demonstrating increased LOB with therapist providing min-mod A to prevent fall.  Will continue to address and will begin to re-assess LTG next week to determine if pt would benefit from recertification vs. ready for D/C.   Rehab Potential Good   Clinical Impairments Affecting Rehab Potential Pt currently undergoing testing to determine cause of CVAs   PT Frequency 2x / week   PT Duration 4 weeks   PT Treatment/Interventions ADLs/Self Care Home Management;Canalith Repostioning;Balance training;Neuromuscular re-education;Patient/family education;Vestibular;Therapeutic exercise;Therapeutic activities;Functional mobility training;Stair training;Gait training;DME Instruction;Manual techniques   PT Next Visit Plan Begin to check LTG-due by 9/38-[id number] recertify vs. D/C; continue to focus on balance strategies, use of vestibular system with narrow BOS/compliant surfaces and decreased use of vision, ankle strength and foot clearance   Consulted and Agree with Plan of Care Patient      Patient will benefit from skilled therapeutic intervention in order to improve the  following deficits and impairments:  Abnormal gait, Impaired flexibility, Dizziness, Decreased balance, Decreased mobility, Decreased knowledge of use of DME, Decreased strength  Visit Diagnosis: Other abnormalities of gait and mobility  Muscle weakness (generalized)  Dizziness and giddiness  Unsteadiness on feet     Problem List Patient Active Problem List   Diagnosis Date Noted  .  Cryptogenic stroke ([place]) 12/27/2016  . Hepatic steatosis 01/20/2014  . Gallstones 01/20/2014  . Pancreatic cyst 01/20/2014  . Abnormal CT scan, liver 01/02/2014  . Back spasm 06/10/2013  . Osteopenia 09/20/2012  . COUGH VARIANT ASTHMA 02/11/2010  . Diverticulosis of large intestine 12/25/2009  . Wrist osteonecrosis 12/25/2009  . Vitamin D deficiency 11/28/2008  . NEPHROLITHIASIS, HX OF 11/28/2008  . Dyslipidemia 11/24/2007  . Allergic rhinitis, cause unspecified 11/24/2007  . PALPITATIONS 11/24/2007  . DEGENERATIVE DISC DISEASE 01/19/2007  . SKIN CANCER, HX OF 01/19/2007  . COLONIC POLYPS, HX OF 01/19/2007   Raylene Everts, PT, DPT 01/30/17    5:42 PM    [facility] [address] [place], [place], [address] Phone: [phone]   Fax:  [phone]  Name: Sarah Valdez MRN: [id number] Date of Birth: November 05, 1945

## 2017-01-30 NOTE — Telephone Encounter (Signed)
Per Dr Jaynee Eagles, spoke with patient and advised her that echocardiogram bubble study is  not Dr Cathren Laine specialty. Advised Dr Jaynee Eagles did the echo to look for clots and causes of stroke which she did not find. Advised her that Dr Jaynee Eagles thinks she needs to follow up with her primary care about this finding. Advised patient a copy of report will be faxed to Dr Aviva Signs today.  Advised she contact her PCP to discuss. She verbalized understanding, appreciation for call.

## 2017-02-03 ENCOUNTER — Ambulatory Visit: Payer: Medicare Other

## 2017-02-03 DIAGNOSIS — R2681 Unsteadiness on feet: Secondary | ICD-10-CM | POA: Diagnosis not present

## 2017-02-03 DIAGNOSIS — R2689 Other abnormalities of gait and mobility: Secondary | ICD-10-CM | POA: Diagnosis not present

## 2017-02-03 DIAGNOSIS — R42 Dizziness and giddiness: Secondary | ICD-10-CM | POA: Diagnosis not present

## 2017-02-03 DIAGNOSIS — M6281 Muscle weakness (generalized): Secondary | ICD-10-CM | POA: Diagnosis not present

## 2017-02-03 NOTE — Therapy (Signed)
[facility] [address] [place], [place], [address] Phone: [phone]   Fax:  [phone]  Physical Therapy Treatment  Patient Details  Name: Sarah Valdez MRN: [id number] Date of Birth: [birth year] Referring Provider: Dr. Jaynee Eagles  Encounter Date: 02/03/2017      PT End of Session - 02/03/17 1700    Visit Number 6   Number of Visits 9   Date for PT Re-Evaluation 02/05/17   Authorization Type Medicare-progress note and G code every 10th visit   PT Start Time 1015   PT Stop Time 1058   PT Time Calculation (min) 43 min   Equipment Utilized During Treatment --  min guard to S prn   Activity Tolerance Patient tolerated treatment well   Behavior During Therapy [facility] for tasks assessed/performed      Past Medical History:  Diagnosis Date  . Allergy   . Cancer ([place])    skin cancer - on back, forehead  . Diverticulosis of colon 2007   Dr Olevia Perches  . History of basal cell carcinoma excision    X3  . History of colon polyps    NON-CANCEROUS  . History of kidney stones   . Hyperlipidemia   . OA (osteoarthritis of spine)   . Palpitations    tx with diltiazem  . PONV (postoperative nausea and vomiting)   . PSVT (paroxysmal supraventricular tachycardia) ([place])   . Right ureteral stone   . Urgency of urination   . Wears glasses     Past Surgical History:  Procedure Laterality Date  . APPENDECTOMY  1950  . 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 CLOSED MANIPULATION POST TOTAL  KNEE ARTHROPLASTY  07-13-2006  . COLONOSCOPY W/ POLYPECTOMY  2002     adenomatous; Dr Olevia Perches  . EXTRACORPOREAL SHOCK WAVE LITHOTRIPSY  X3  . FACELIFT W/BLEPHAROPLASTY  2005  . HYSTEROSCOPY W/D&C  12-22-2000  . OPEN KNEE MENISECTOMY  1964  . RIGHT URETEROSCOPIC STONE EXTRACTION / STENT PLACEMENT  08-21-2008  . rotator cuff on right shoulder    . TONSILLECTOMY  1950   . TOTAL KNEE ARTHROPLASTY Right 05-30-2006  . TUBAL LIGATION    . WISDOM TOOTH EXTRACTION      There were no vitals filed for this visit.      Subjective Assessment - 02/03/17 1016    Subjective Pt reported she has been a little unsteady during STS txfs, pt reports she has to stop and get her feet under her. Pt reports she has been performing HEP. Pt reported MD placed her on prednisone and continued antibiotics to ensure PNA is gone.    Pertinent History 08/2016 R rotator cuff repair and s/p OPPT, osteopenia, chronic LBP, scoliosis, LE edema, nocturia, DDD of Lx spine, PSVT, HLD, hx of skin CA, arthritis   Patient Stated Goals Be done with the dizziness   Currently in Pain? No/denies            [facility] PT Assessment - 02/03/17 1047      Functional Gait  Assessment   Gait assessed  Yes   Gait Level Surface Walks 20 ft in less than 5.5 sec, no assistive devices, good speed, no evidence for imbalance, normal gait pattern, deviates no more than 6 in outside of the 12 in walkway width.   Change in Gait Speed Able to smoothly change walking speed without loss of balance or gait deviation. Deviate no  more than 6 in outside of the 12 in walkway width.   Gait with Horizontal Head Turns Performs head turns smoothly with slight change in gait velocity (eg, minor disruption to smooth gait path), deviates 6-10 in outside 12 in walkway width, or uses an assistive device.   Gait with Vertical Head Turns Performs head turns with no change in gait. Deviates no more than 6 in outside 12 in walkway width.   Gait and Pivot Turn Pivot turns safely within 3 sec and stops quickly with no loss of balance.   Step Over Obstacle Is able to step over 2 stacked shoe boxes taped together (9 in total height) without changing gait speed. No evidence of imbalance.   Gait with Narrow Base of Support Is able to ambulate for 10 steps heel to toe with no staggering.   Gait with Eyes Closed Walks 20 ft, uses assistive  device, slower speed, mild gait deviations, deviates 6-10 in outside 12 in walkway width. Ambulates 20 ft in less than 9 sec but greater than 7 sec.  deviated slightly off path   Ambulating Backwards Walks 20 ft, no assistive devices, good speed, no evidence for imbalance, normal gait   Steps Alternating feet, no rail.   Total Score 28   FGA comment: Indicates pt is at a low falls risk.            Vestibular Assessment - 02/03/17 1026      Symptom Behavior   Type of Dizziness Lightheadedness  unsteady   Frequency of Dizziness Intermittent dizziness throughout the week   Duration of Dizziness Intermittent   Aggravating Factors Sit to stand;Turning head sideways   Relieving Factors Rest     Occulomotor Exam   Occulomotor Alignment Normal   Spontaneous Absent   Gaze-induced Absent   Smooth Pursuits Intact   Saccades Slow   Comment Left eye did not adduct and pt did not report double vision with pen within <1" of her nose     Vestibulo-Occular Reflex   VOR 1 Head Only (x 1 viewing) Intact   Comment B HIT: negative     Positional Testing   Dix-Hallpike Dix-Hallpike Right;Dix-Hallpike Left   Horizontal Canal Testing Horizontal Canal Right;Horizontal Canal Left     Dix-Hallpike Right   Dix-Hallpike Right Duration none   Dix-Hallpike Right Symptoms No nystagmus     Dix-Hallpike Left   Dix-Hallpike Left Duration none   Dix-Hallpike Left Symptoms No nystagmus     Horizontal Canal Right   Horizontal Canal Right Duration none   Horizontal Canal Right Symptoms Normal     Horizontal Canal Left   Horizontal Canal Left Duration none   Horizontal Canal Left Symptoms Normal     Orthostatics   BP supine (x 5 minutes) 110/70   HR supine (x 5 minutes) 65   BP sitting 111/65  pt reported a "touch" of lightheadedness upon sitting up   HR sitting 64   BP standing (after 1 minute) 115/71   HR standing (after 1 minute) 78   Orthostatics Comment Pt denied dizziness upon standing  but reported lightheadedness occurs mainly when getting up to use bathroom at night or early morning.                         PT Education - 02/03/17 1700    Education provided Yes   Education Details PT discussed vestibular assessment and outcome measure results. PT discussed d/c next session based  on progress and goals.    Person(s) Educated Patient   Methods Explanation   Comprehension Verbalized understanding          PT Short Term Goals - 01/06/17 1641      PT SHORT TERM GOAL #1   Title Same as LTGs           PT Long Term Goals - 02/03/17 1702      PT LONG TERM GOAL #1   Title Pt will be IND in HEP to improve balance, flexibilty and strength. TARGET DATE FOR ALL LTGS: 02/05/17   Status On-going     PT LONG TERM GOAL #2   Title Pt will improve FGA score to >/=29/30 to decr. falls risk.    Status Partially Met     PT LONG TERM GOAL #3   Title Pt will participate in SOT testing with LTG to be written   Baseline WNL for 19-79 age group   Status Deferred     PT LONG TERM GOAL #4   Title Pt will amb. 1000' over even/uneven terrain, IND, without dizziness to improve functional mobility.    Status On-going               Plan - 02/03/17 1701    Clinical Impression Statement Pt partially met LTG 2, as she was one point from goal. Pt's FGA score of 28/30 indicates pt is at low risk for falls. Pt's vestibular assessment was negative for positional vertigo and orthostatic hypotension. Pt did experience impaired convergence (L eye did not adduct, pt states this occurred after stroke). Convergence issues also noted during 10/2016 PT eval. PT will assess remaining goals next session and likely d/c based on progress.    Rehab Potential Good   Clinical Impairments Affecting Rehab Potential Pt currently undergoing testing to determine cause of CVAs   PT Frequency 2x / week   PT Duration 4 weeks   PT Treatment/Interventions ADLs/Self Care Home  Management;Canalith Repostioning;Balance training;Neuromuscular re-education;Patient/family education;Vestibular;Therapeutic exercise;Therapeutic activities;Functional mobility training;Stair training;Gait training;DME Instruction;Manual techniques   PT Next Visit Plan Assess remaining LTGs and d/c.   Consulted and Agree with Plan of Care Patient      Patient will benefit from skilled therapeutic intervention in order to improve the following deficits and impairments:  Abnormal gait, Impaired flexibility, Dizziness, Decreased balance, Decreased mobility, Decreased knowledge of use of DME, Decreased strength  Visit Diagnosis: Other abnormalities of gait and mobility  Dizziness and giddiness     Problem List Patient Active Problem List   Diagnosis Date Noted  . Cryptogenic stroke ([place]) 12/27/2016  . Hepatic steatosis 01/20/2014  . Gallstones 01/20/2014  . Pancreatic cyst 01/20/2014  . Abnormal CT scan, liver 01/02/2014  . Back spasm 06/10/2013  . Osteopenia 09/20/2012  . COUGH VARIANT ASTHMA 02/11/2010  . Diverticulosis of large intestine 12/25/2009  . Wrist osteonecrosis 12/25/2009  . Vitamin D deficiency 11/28/2008  . NEPHROLITHIASIS, HX OF 11/28/2008  . Dyslipidemia 11/24/2007  . Allergic rhinitis, cause unspecified 11/24/2007  . PALPITATIONS 11/24/2007  . DEGENERATIVE DISC DISEASE 01/19/2007  . SKIN CANCER, HX OF 01/19/2007  . COLONIC POLYPS, HX OF 01/19/2007    Glorianna Gott L 02/03/2017, 5:03 PM  [facility] [address] [place], [place], [address] Phone: [phone]   Fax:  [phone]  Name: Sarah Valdez MRN: [id number] Date of Birth: 04-Oct-1945  Geoffry Paradise, PT,DPT 02/03/17 5:04 PM Phone: [phone] Fax: [phone]

## 2017-02-04 ENCOUNTER — Ambulatory Visit: Payer: Medicare Other

## 2017-02-04 VITALS — BP 98/61 | HR 80

## 2017-02-04 DIAGNOSIS — M6281 Muscle weakness (generalized): Secondary | ICD-10-CM | POA: Diagnosis not present

## 2017-02-04 DIAGNOSIS — R42 Dizziness and giddiness: Secondary | ICD-10-CM | POA: Diagnosis not present

## 2017-02-04 DIAGNOSIS — R2681 Unsteadiness on feet: Secondary | ICD-10-CM

## 2017-02-04 DIAGNOSIS — R2689 Other abnormalities of gait and mobility: Secondary | ICD-10-CM

## 2017-02-04 NOTE — Therapy (Signed)
[facility] [address] [place], [place], [address] Phone: [phone]   Fax:  [phone]  Physical Therapy Treatment  Patient Details  Name: Sarah Valdez MRN: [id number] Date of Birth: 1946/07/15 Referring Provider: Dr. Jaynee Eagles  Encounter Date: 02/04/2017      PT End of Session - 02/04/17 1427    Visit Number 7   Number of Visits 9   Date for PT Re-Evaluation 02/05/17   Authorization Type Medicare-progress note and G code every 10th visit   PT Start Time 1402   PT Stop Time 1425   PT Time Calculation (min) 23 min   Activity Tolerance Patient tolerated treatment well   Behavior During Therapy [facility] for tasks assessed/performed      Past Medical History:  Diagnosis Date  . Allergy   . Cancer ([place])    skin cancer - on back, forehead  . Diverticulosis of colon 2007   Dr Olevia Perches  . History of basal cell carcinoma excision    X3  . History of colon polyps    NON-CANCEROUS  . History of kidney stones   . Hyperlipidemia   . OA (osteoarthritis of spine)   . Palpitations    tx with diltiazem  . PONV (postoperative nausea and vomiting)   . PSVT (paroxysmal supraventricular tachycardia) ([place])   . Right ureteral stone   . Urgency of urination   . Wears glasses     Past Surgical History:  Procedure Laterality Date  . APPENDECTOMY  1950  . 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 CLOSED MANIPULATION POST TOTAL  KNEE ARTHROPLASTY  07-13-2006  . COLONOSCOPY W/ POLYPECTOMY  2002     adenomatous; Dr Olevia Perches  . EXTRACORPOREAL SHOCK WAVE LITHOTRIPSY  X3  . FACELIFT W/BLEPHAROPLASTY  2005  . HYSTEROSCOPY W/D&C  12-22-2000  . OPEN KNEE MENISECTOMY  1964  . RIGHT URETEROSCOPIC STONE EXTRACTION / STENT PLACEMENT  08-21-2008  . rotator cuff on right shoulder    . TONSILLECTOMY  1950  . TOTAL KNEE ARTHROPLASTY Right 05-30-2006  . TUBAL LIGATION     . WISDOM TOOTH EXTRACTION      Vitals:   02/04/17 1415  BP: 98/61  Pulse: 80        Subjective Assessment - 02/04/17 1405    Subjective Pt denied falls or changes since last visit. Pt reported no dizziness today.    Pertinent History 08/2016 R rotator cuff repair and s/p OPPT, osteopenia, chronic LBP, scoliosis, LE edema, nocturia, DDD of Lx spine, PSVT, HLD, hx of skin CA, arthritis   Patient Stated Goals Be done with the dizziness   Currently in Pain? No/denies                    [facility] Adult PT Treatment/Exercise - 02/04/17 1412      Ambulation/Gait   Ambulation/Gait Yes   Ambulation/Gait Assistance 7: Independent   Ambulation/Gait Assistance Details Pt amb. while performing head turns and denied dizziness.   Ambulation Distance (Feet) 1100 Feet   Assistive device None   Gait Pattern Within Functional Limits;Step-through pattern   Ambulation Surface Level;Unlevel;Indoor;Outdoor;Paved;Grass           Neuro re-ed     PT Education - 02/04/17 1426    Education provided Yes   Education Details PT reviewed balance and ankle HEP with pt, also reviewed goal progress and d/c. Pt agreeable. PT printed heel  raise exercises as pt stated she doesn't remember performing this exercise.   Person(s) Educated Patient   Methods Explanation;Demonstration;Verbal cues;Handout   Comprehension Returned demonstration;Verbalized understanding          PT Short Term Goals - 01/06/17 1641      PT SHORT TERM GOAL #1   Title Same as LTGs           PT Long Term Goals - 2017-02-06 1428      PT LONG TERM GOAL #1   Title Pt will be IND in HEP to improve balance, flexibilty and strength. TARGET DATE FOR ALL LTGS: 02/05/17   Status Achieved     PT LONG TERM GOAL #2   Title Pt will improve FGA score to >/=29/30 to decr. falls risk.    Status Partially Met     PT LONG TERM GOAL #3   Title Pt will participate in SOT testing with LTG to be written   Baseline WNL for 64-79  age group   Status Deferred     PT LONG TERM GOAL #4   Title Pt will amb. 1000' over even/uneven terrain, IND, without dizziness to improve functional mobility.    Status Achieved               Plan - 02/06/2017 1427    Clinical Impression Statement Pt met LTGs 1 and 4. LTG 3 deferred as SOT results were WNL limits for age group. Please see d/c summary for details.    Rehab Potential Good   Clinical Impairments Affecting Rehab Potential Pt currently undergoing testing to determine cause of CVAs   PT Frequency 2x / week   PT Duration 4 weeks   PT Treatment/Interventions ADLs/Self Care Home Management;Canalith Repostioning;Balance training;Neuromuscular re-education;Patient/family education;Vestibular;Therapeutic exercise;Therapeutic activities;Functional mobility training;Stair training;Gait training;DME Instruction;Manual techniques   PT Next Visit Plan Assess remaining LTGs and d/c.   Consulted and Agree with Plan of Care Patient      Patient will benefit from skilled therapeutic intervention in order to improve the following deficits and impairments:  Abnormal gait, Impaired flexibility, Dizziness, Decreased balance, Decreased mobility, Decreased knowledge of use of DME, Decreased strength  Visit Diagnosis: Other abnormalities of gait and mobility  Dizziness and giddiness  Muscle weakness (generalized)  Unsteadiness on feet       G-Codes - February 06, 2017 1428    Functional Assessment Tool Used (Outpatient Only) FGA: 28/30   Functional Limitation Mobility: Walking and moving around   Mobility: Walking and Moving Around Goal Status [phone]) At least 1 percent but less than 20 percent impaired, limited or restricted   Mobility: Walking and Moving Around Discharge Status [phone]) At least 1 percent but less than 20 percent impaired, limited or restricted      Problem List Patient Active Problem List   Diagnosis Date Noted  . Cryptogenic stroke ([place]) 12/27/2016  . Hepatic  steatosis 01/20/2014  . Gallstones 01/20/2014  . Pancreatic cyst 01/20/2014  . Abnormal CT scan, liver 01/02/2014  . Back spasm 06/10/2013  . Osteopenia 09/20/2012  . COUGH VARIANT ASTHMA 02/11/2010  . Diverticulosis of large intestine 12/25/2009  . Wrist osteonecrosis 12/25/2009  . Vitamin D deficiency 11/28/2008  . NEPHROLITHIASIS, HX OF 11/28/2008  . Dyslipidemia 11/24/2007  . Allergic rhinitis, cause unspecified 11/24/2007  . PALPITATIONS 11/24/2007  . DEGENERATIVE DISC DISEASE 01/19/2007  . SKIN CANCER, HX OF 01/19/2007  . COLONIC POLYPS, HX OF 01/19/2007    Elizabella Nolet L 02-06-2017, 2:29 PM  [facility]  [address] [place], [place], [address] Phone: [phone]   Fax:  [phone]  Name: Sarah Valdez MRN: [id number] Date of Birth: July 08, 1946  PHYSICAL THERAPY DISCHARGE SUMMARY  Visits from Start of Care: 7  Current functional level related to goals / functional outcomes:     PT Long Term Goals - 02/04/17 1428      PT LONG TERM GOAL #1   Title Pt will be IND in HEP to improve balance, flexibilty and strength. TARGET DATE FOR ALL LTGS: 02/05/17   Status Achieved     PT LONG TERM GOAL #2   Title Pt will improve FGA score to >/=29/30 to decr. falls risk.    Status Partially Met     PT LONG TERM GOAL #3   Title Pt will participate in SOT testing with LTG to be written   Baseline WNL for 50-79 age group   Status Deferred     PT LONG TERM GOAL #4   Title Pt will amb. 1000' over even/uneven terrain, IND, without dizziness to improve functional mobility.    Status Achieved        Remaining deficits: None   Education / Equipment: HEP  Plan: Patient agrees to discharge.  Patient goals were met. Patient is being discharged due to meeting the stated rehab goals.  ?????       Geoffry Paradise, PT,DPT 02/04/17 2:30 PM Phone: [phone] Fax: [phone]

## 2017-02-04 NOTE — Patient Instructions (Signed)
ANKLE: Plantarflexion, Unilateral - Standing    Hold onto counter for safety. Stand on leg. Raise heel up as high as possible 10 times with knee straight, repeat 10 times while bending knee. Perform  _2__ sets per day, _3__ days per week. Copyright  VHI. All rights reserved.

## 2017-03-05 ENCOUNTER — Encounter: Payer: Self-pay | Admitting: Neurology

## 2017-03-05 ENCOUNTER — Ambulatory Visit (INDEPENDENT_AMBULATORY_CARE_PROVIDER_SITE_OTHER): Payer: Medicare Other | Admitting: Neurology

## 2017-03-05 VITALS — BP 119/76 | HR 81 | Ht 67.0 in | Wt 172.0 lb

## 2017-03-05 DIAGNOSIS — I639 Cerebral infarction, unspecified: Secondary | ICD-10-CM | POA: Diagnosis not present

## 2017-03-05 DIAGNOSIS — G379 Demyelinating disease of central nervous system, unspecified: Secondary | ICD-10-CM

## 2017-03-05 DIAGNOSIS — R42 Dizziness and giddiness: Secondary | ICD-10-CM

## 2017-03-05 NOTE — Progress Notes (Addendum)
[facility]    Provider:  Dr Jaynee Eagles Referring Provider: Shon Baton, MD Primary Care Physician:  Shon Baton, MD CC:  Stroke  Interval history 03/05/2017: Patient is here for follow-up of subacute strokes that were seen on MRI of the brain. She was started on aspirin 325 as well as hemoglobin A1c, she follows with PCP for cholesterol and advised LDL goal is less than 70. CTA of the head showed no intracranial or extracranial stenosis or occlusion of significance. Echocardiogram of the heart showed no PFO or thrombus.  Cardiac telemetry did not show atrial fibrillation.  HPI:  Sarah Valdez is a 71 y.o. female here as a referral from Dr. Virgina Jock for stroke. She has a history of palpitations. 10 years ago she had extensive palpitations. MRi results showed subacute strokes. She has a history of supraventricular tacycardia. No history of afib. PMHx OA, HLD, She was not on anti-platelet prior to dizziness. Dizziness since September, waxes and wanes, sometimes it is room spinning or imbalance. Currently she is fine but a week ago it was more imbalance when standing. She had a fall at [place], she was rushing and she fell and hit her head on th left side. She bit her lip. Otherwise she can;t remember any event that may have happened in the last several months coinciding with the MRI results. She tends to walk and catches her toe and has falls. She has had vestibular therapy but never for gait abnormality or falls. No other focal neurologic deficits, associated symptoms, inciting events or modifiable factors.  Reviewed notes, labs and imaging from outside physicians, which showed:  Reviewed ENT notes. She presented with a chief complaint of dizziness and hearing loss. This started last fall when she started feeling imbalance and standing. There has been some room spinning. Onset was fairly sudden. PCP prescribed meclizine. The feeling comes and goes and goes away for a few weeks and comes  back for a few weeks. Meclizine is helpful. The feeling is constant even when not moving. Head movements do not affect the feeling. She has white noise all the time that worsens. There is an associated pressure feeling in the head. When she stands the imbalance worsens. She has trouble hearing high-pitched sounds. She work with a physical therapist who did not find a problem. She has a remote history of migraines. Exam showed normal on exam, hearing test demonstrates symmetric sensorineural loss. Her imbalance symptoms not typical of an inner ear process. He recommended brain imaging.  Personally reviewed MRI images and agree with the following:  IMPRESSION: Four foci of low level restricted diffusion and contrast enhancement in the deep white matter of the left hemisphere probably consistent with late subacute small vessel infarctions. These could be embolic or watershed. No evidence of cortical infarction. The differential diagnosis does include demyelinating disease, but that is felt less likely.  Mild chronic small-vessel ischemic change of the hemispheric white matter in addition.  LDL 96 in 2016Review of Systems: Patient complains of symptoms per HPI as well as the following symptoms:no CP, no SOB. Pertinent negatives and positives per HPI. All others negative.   Social History   Social History  . Marital status: Married    Spouse name: N/A  . Number of children: N/A  . Years of education: N/A   Occupational History  . Not on file.   Social History Main Topics  . Smoking status: Never Smoker  . Smokeless tobacco: Never Used  . Alcohol use 3.0 oz/week  5 Glasses of wine per week  . Drug use: No  . Sexual activity: Not on file   Other Topics Concern  . Not on file   Social History Narrative   Lives at home    Family History  Problem Relation Age of Onset  . Heart attack Mother 7  . Emphysema Father        smoker  . Colon polyps Father   . Breast cancer Sister    . Cancer Paternal Grandfather        throat cancer  . Esophageal cancer Paternal Grandfather   . Heart attack Maternal Grandfather 76  . Diabetes Neg Hx   . Stroke Neg Hx   . Colon cancer Neg Hx   . Rectal cancer Neg Hx   . Stomach cancer Neg Hx     Past Medical History:  Diagnosis Date  . Allergy   . Cancer ([place])    skin cancer - on back, forehead  . Diverticulosis of colon 2007   Dr Olevia Perches  . History of basal cell carcinoma excision    X3  . History of colon polyps    NON-CANCEROUS  . History of kidney stones   . Hyperlipidemia   . OA (osteoarthritis of spine)   . Palpitations    tx with diltiazem  . PONV (postoperative nausea and vomiting)   . PSVT (paroxysmal supraventricular tachycardia) ([place])   . Right ureteral stone   . Urgency of urination   . Wears glasses     Past Surgical History:  Procedure Laterality Date  . APPENDECTOMY  1950  . 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 CLOSED MANIPULATION POST TOTAL  KNEE ARTHROPLASTY  07-13-2006  . COLONOSCOPY W/ POLYPECTOMY  2002     adenomatous; Dr Olevia Perches  . EXTRACORPOREAL SHOCK WAVE LITHOTRIPSY  X3  . FACELIFT W/BLEPHAROPLASTY  2005  . HYSTEROSCOPY W/D&C  12-22-2000  . OPEN KNEE MENISECTOMY  1964  . RIGHT URETEROSCOPIC STONE EXTRACTION / STENT PLACEMENT  08-21-2008  . rotator cuff on right shoulder    . TONSILLECTOMY  1950  . TOTAL KNEE ARTHROPLASTY Right 05-30-2006  . TUBAL LIGATION    . WISDOM TOOTH EXTRACTION      Current Outpatient Prescriptions  Medication Sig Dispense Refill  . alendronate (FOSAMAX) 70 MG tablet     . diclofenac sodium (VOLTAREN) 1 % GEL Apply 2 g topically 4 (four) times daily.    Marland Kitchen diltiazem (CARDIZEM) 120 MG tablet TAKE 1 TABLET TWICE DAILY 180 tablet 0  . fluticasone (FLONASE) 50 MCG/ACT nasal spray Place into both nostrils daily.    . furosemide (LASIX) 20 MG tablet TAKE ONE TABLET BY MOUTH DAILY AS NEEDED  FOR SWELLING 30 tablet 1  . meclizine (ANTIVERT) 25 MG tablet Take 25 mg by mouth 3 (three) times daily as needed for dizziness.    Marland Kitchen MELATONIN PO Take by mouth at bedtime as needed.     . naproxen sodium (ANAPROX) 220 MG tablet Take 220 mg by mouth every morning.    . pravastatin (PRAVACHOL) 40 MG tablet Take 1 tablet (40 mg total) by mouth daily. 90 tablet 0   No current facility-administered medications for this visit.     Allergies as of 03/05/2017 - Review Complete 03/05/2017  Allergen Reaction Noted  . Morphine Other (See Comments) 01/19/2007  . Penicillins Rash 01/19/2007  . Levofloxacin Other (See Comments)     Vitals:  BP 119/76   Pulse 81   Ht 5\' 7"  (1.702 m)   Wt 172 lb (78 kg)   BMI 26.94 kg/m  Last Weight:  Wt Readings from Last 1 Encounters:  03/05/17 172 lb (78 kg)   Last Height:   Ht Readings from Last 1 Encounters:  03/05/17 5\' 7"  (1.702 m)    Physical exam: Exam: Gen: NAD, conversant, well nourised, obese, well groomed                      Neuro: Detailed Neurologic Exam  Speech:    Speech is normal; fluent and spontaneous with normal comprehension.  Cognition:    The patient is oriented to person, place, and time;    Cranial Nerves:    The pupils are equal, round, and reactive to light. Visual fields are full to finger confrontation. Extraocular movements are intact. Trigeminal sensation is intact and the muscles of mastication are normal. The face is symmetric. The palate elevates in the midline. Hearing intact. Voice is normal. Shoulder shrug is normal. The tongue has normal motion without fasciculations.   Motor Observation:    No asymmetry, no atrophy, and no involuntary movements noted. Tone:    Normal muscle tone.    Posture:    Posture is normal. normal erect    Strength:    Strength is V/V in the upper and lower limbs.         Assessment/Plan:  71 year old with chronic dizziness and imbalance for 6-7 months. MRI one month ago  showed foci of low level restricted diffusion and contrast enhancement in the deep white matter of the left hemisphere probably consistent with late subacute small vessel infarctions. These could be embolic or watershed.   Need repeat MRI brain to watch for evolution of white-matter lesions to ensure they were infarcts; in the subacute stage it is difficult to determine with certainty the etiology of lesions and differential includes demyelination which is less likely Physical therapy for gait and imbalance, falls Needs a stroke workup: Need imaging of the blood vessels of the head and neck, CTA of the head and neck, echocardiogram of heart, 30 day heart monitor all unremarkable. Will repeat MRI brain. If repeat MRI still appears with embolic looking events need loop recorder ASA 325mg  daily  Orders Placed This Encounter  Procedures  . MR BRAIN W WO CONTRAST   Sarina Ill, MD  [facility] [address] [place], [place] [address]  Phone [phone] Fax [phone]   A total of 40 minutes was spent face-to-face with this patient. Over half this time was spent on counseling patient on the embolic stroke diagnosis and different diagnostic and therapeutic options available.

## 2017-03-05 NOTE — Patient Instructions (Addendum)
Remember to drink plenty of fluid, eat healthy meals and do not skip any meals. Try to eat protein with a every meal and eat a healthy snack such as fruit or nuts in between meals. Try to keep a regular sleep-wake schedule and try to exercise daily, particularly in the form of walking, 20-30 minutes a day, if you can.   As far as your medications are concerned, I would like to suggest: Myrbetriq  MRi braina nd possible Loop recorder  I would like to see you back in 6 months, sooner if we need to. Please call us with any interim questions, concerns, problems, updates or refill requests.   Our phone number is [phone]. We also have an after hours call service for urgent matters and there is a physician on-call for urgent questions. For any emergencies you know to call 911 or go to the nearest emergency room

## 2017-03-07 NOTE — Addendum Note (Signed)
Addended by: Sarina Ill B on: 03/07/2017 11:23 AM   Modules accepted: Level of Service

## 2017-03-15 ENCOUNTER — Ambulatory Visit
Admission: RE | Admit: 2017-03-15 | Discharge: 2017-03-15 | Disposition: A | Discharge status: Home / Self Care | Payer: Medicare Other | Source: Ambulatory Visit | Attending: Neurology | Admitting: Neurology

## 2017-03-15 DIAGNOSIS — G379 Demyelinating disease of central nervous system, unspecified: Secondary | ICD-10-CM

## 2017-03-15 DIAGNOSIS — R42 Dizziness and giddiness: Secondary | ICD-10-CM

## 2017-03-15 DIAGNOSIS — I639 Cerebral infarction, unspecified: Secondary | ICD-10-CM

## 2017-03-15 MED ORDER — GADOBENATE DIMEGLUMINE 529 MG/ML IV SOLN
15.0000 mL | Freq: Once | INTRAVENOUS | Status: AC | PRN
  Administered 2017-03-15: 15 mL via INTRAVENOUS

## 2017-03-17 ENCOUNTER — Other Ambulatory Visit: Payer: Self-pay | Admitting: Neurology

## 2017-03-17 DIAGNOSIS — G35 Multiple sclerosis: Secondary | ICD-10-CM

## 2017-03-17 DIAGNOSIS — R9089 Other abnormal findings on diagnostic imaging of central nervous system: Secondary | ICD-10-CM

## 2017-03-20 ENCOUNTER — Telehealth: Payer: Self-pay | Admitting: Neurology

## 2017-03-20 NOTE — Telephone Encounter (Signed)
Patient called office asking to speak with Dr. Jaynee Eagles in reference to questions with MRI results.  Please call

## 2017-03-24 NOTE — Telephone Encounter (Signed)
Spoke to patient discussed findings, she has labs ordered and LP scheduled. Will see her in the office when results are back.

## 2017-03-25 ENCOUNTER — Other Ambulatory Visit (INDEPENDENT_AMBULATORY_CARE_PROVIDER_SITE_OTHER): Payer: Self-pay

## 2017-03-25 DIAGNOSIS — G35 Multiple sclerosis: Secondary | ICD-10-CM | POA: Diagnosis not present

## 2017-03-25 DIAGNOSIS — Z0289 Encounter for other administrative examinations: Secondary | ICD-10-CM

## 2017-03-25 DIAGNOSIS — R9089 Other abnormal findings on diagnostic imaging of central nervous system: Secondary | ICD-10-CM

## 2017-03-26 LAB — ANGIOTENSIN CONVERTING ENZYME: ANGIO CONVERT ENZYME: 27 U/L (ref 14–82)

## 2017-03-26 LAB — ANA W/REFLEX: ANA: NEGATIVE

## 2017-03-26 LAB — SJOGREN'S SYNDROME ANTIBODS(SSA + SSB)
ENA SSA (RO) Ab: <0.2 AI (ref 0.0–0.9)
ENA SSB (LA) Ab: <0.2 AI (ref 0.0–0.9)

## 2017-03-26 LAB — SEDIMENTATION RATE: SED RATE: 2 mm/h (ref 0–40)

## 2017-03-27 ENCOUNTER — Telehealth: Payer: Self-pay | Admitting: *Deleted

## 2017-03-27 LAB — PAN-ANCA
ANCA Proteinase 3: <3.5 U/mL (ref 0.0–3.5)
Atypical pANCA: <1:20 {titer}
Myeloperoxidase Ab: <9 U/mL (ref 0.0–9.0)

## 2017-03-27 NOTE — Telephone Encounter (Signed)
Spoke with patient and informed her that aher labs are normal.  Advised she will get a  Call with her with LP results when available. She verbalized understanding, appreciation.

## 2017-04-06 ENCOUNTER — Ambulatory Visit
Admission: RE | Admit: 2017-04-06 | Discharge: 2017-04-06 | Disposition: A | Discharge status: Home / Self Care | Payer: Medicare Other | Source: Ambulatory Visit | Attending: Neurology | Admitting: Neurology

## 2017-04-06 VITALS — BP 121/68 | HR 64

## 2017-04-06 DIAGNOSIS — G379 Demyelinating disease of central nervous system, unspecified: Secondary | ICD-10-CM | POA: Diagnosis not present

## 2017-04-06 DIAGNOSIS — R2689 Other abnormalities of gait and mobility: Secondary | ICD-10-CM | POA: Diagnosis not present

## 2017-04-06 DIAGNOSIS — I639 Cerebral infarction, unspecified: Secondary | ICD-10-CM | POA: Diagnosis not present

## 2017-04-06 DIAGNOSIS — G35 Multiple sclerosis: Secondary | ICD-10-CM

## 2017-04-06 LAB — CSF CELL COUNT WITH DIFFERENTIAL
RBC COUNT CSF: 7 {cells}/uL (ref 0–10)
WBC CSF: 1 {cells}/uL (ref 0–5)

## 2017-04-06 LAB — GLUCOSE, CSF: GLUCOSE CSF: 58 mg/dL (ref 43–76)

## 2017-04-06 LAB — PROTEIN, CSF: Total Protein, CSF: 45 mg/dL (ref 15–60)

## 2017-04-06 NOTE — Progress Notes (Signed)
1SST tube drawn form right AC to go with spinal fluid. Pt tolerated well, site is unremarkable.

## 2017-04-06 NOTE — Discharge Instructions (Signed)

## 2017-04-08 LAB — BORRELIA SPECIES DNA, FLUID, PCR: B. burgdorferi DNA: NOT DETECTED

## 2017-04-10 LAB — CNS IGG SYNTHESIS RATE, CSF+BLOOD
ALBUMIN, SERUM(NEPH): 4 g/dL (ref 3.2–4.6)
Albumin, CSF: 22.2 mg/dL (ref 8.0–42.0)
IGG INDEX, CSF: 0.54 (ref <0.66)
IGG, CSF: 2.4 mg/dL (ref 0.8–7.7)
IGG, SERUM: 806 mg/dL (ref 694–1618)
MS CNS IGG SYNTHESIS RATE: -1 mg/(24.h) (ref <=3.3)

## 2017-04-10 LAB — VDRL, CSF: VDRL Quant, CSF: NONREACTIVE

## 2017-04-10 LAB — ANGIOTENSIN CONVERTING ENZYME, CSF: ACE, CSF: 3 U/L (ref <=15)

## 2017-04-15 ENCOUNTER — Telehealth: Payer: Self-pay | Admitting: Neurology

## 2017-04-15 LAB — OLIGOCLONAL BANDS, CSF + SERM

## 2017-04-15 NOTE — Telephone Encounter (Signed)
Dr. Felecia Shelling, You reviewed this MRi with me that showed enhancing lesions. I had an LP completed would you mind reviewing and following the results and calling patient since I am in [place] and won;t return until Monday? Not sure why the oligoclonal bands are not back yet but they should be back very soon. Thank you!

## 2017-04-16 ENCOUNTER — Telehealth: Payer: Self-pay | Admitting: Neurology

## 2017-04-16 NOTE — Telephone Encounter (Signed)
I spoke to Sarah Valdez about the results of the lumbar puncture. They are normal. Specifically, there were no oligoclonal bands and the IgG index was normal. There was no evidence of inflammation.      We discussed the significance of her 2 MRIs and her other studies. MS is much less likely with a normal lumbar puncture. With a normal LP, after genic strokes, likely embolic, become the more likely diagnosis. Her echocardiogram with bubble study was essentially normal for age.  It would be reasonable to check a loop recorder or Holter monitor to determine if she has physical atrial fibrillation that could be a source of embolic strokes.  When he gets back, at a convenient time, she would like to see you again to go home for the results and her neck steps.

## 2017-04-16 NOTE — Telephone Encounter (Signed)
Vivien Rota, the rest of the LP results finally came in. I spoke to Mrs. Danielsen and let her know that there was no evidence of MS. Please see my phone note for details.

## 2017-04-16 NOTE — Telephone Encounter (Signed)
I called the patient. The spinal fluid analysis was unremarkable, no evidence of demyelinating disease. I discussed this with her.

## 2017-04-18 NOTE — Telephone Encounter (Signed)
I left a message for patient. I would like to see her in the office next week to review everything unfortunately Janeal Holmes been out of the office for 2 weeks and so am quite booked next week. I could fit her in Friday at noon. In the meantime I would like to place a referral to cardiology for a loop recorder which will take a few weeks to get an appointment.   Terrence Dupont, please call patient and see if she is willing to come in Friday at noon. Alternatively I could do Wednesday at 730am. And if she is willing to make an appointment for a loop recorder or we can discuss at appointment this week.   Thank you Dr. Felecia Shelling

## 2017-04-20 NOTE — Telephone Encounter (Signed)
Called and spoke with patient. Scheduled appt for Wednesday, 04/22/17 at 1:00pm, check in 12:30pm. She verbalized understanding.

## 2017-04-22 ENCOUNTER — Ambulatory Visit (INDEPENDENT_AMBULATORY_CARE_PROVIDER_SITE_OTHER): Payer: Medicare Other | Admitting: Neurology

## 2017-04-22 ENCOUNTER — Encounter: Payer: Self-pay | Admitting: Neurology

## 2017-04-22 VITALS — BP 110/70 | HR 73 | Resp 16 | Ht 67.0 in | Wt 174.2 lb

## 2017-04-22 DIAGNOSIS — I63412 Cerebral infarction due to embolism of left middle cerebral artery: Secondary | ICD-10-CM

## 2017-04-22 DIAGNOSIS — I639 Cerebral infarction, unspecified: Secondary | ICD-10-CM

## 2017-04-22 NOTE — Patient Instructions (Signed)
Remember to drink plenty of fluid, eat healthy meals and do not skip any meals. Try to eat protein with a every meal and eat a healthy snack such as fruit or nuts in between meals. Try to keep a regular sleep-wake schedule and try to exercise daily, particularly in the form of walking, 20-30 minutes a day, if you can.   As far as your medications are concerned, I would like to suggest: Continue ASA 325mg   As far as diagnostic testing: Loop recorder  I would like to see you back in 6 months , sooner if we need to. Please call us with any interim questions, concerns, problems, updates or refill requests.   Our phone number is [phone]. We also have an after hours call service for urgent matters and there is a physician on-call for urgent questions. For any emergencies you know to call 911 or go to the nearest emergency room  Atrial Fibrillation Atrial fibrillation is a type of irregular or rapid heartbeat (arrhythmia). In atrial fibrillation, the heart quivers continuously in a chaotic pattern. This occurs when parts of the heart receive disorganized signals that make the heart unable to pump blood normally. This can increase the risk for stroke, heart failure, and other heart-related conditions. There are different types of atrial fibrillation, including:  Paroxysmal atrial fibrillation. This type starts suddenly, and it usually stops on its own shortly after it starts.  Persistent atrial fibrillation. This type often lasts longer than a week. It may stop on its own or with treatment.  Long-lasting persistent atrial fibrillation. This type lasts longer than 12 months.  Permanent atrial fibrillation. This type does not go away.  Talk with your health care provider to learn about the type of atrial fibrillation that you have. What are the causes? This condition is caused by some heart-related conditions or procedures, including:  A heart attack.  Coronary artery disease.  Heart  failure.  Heart valve conditions.  High blood pressure.  Inflammation of the sac that surrounds the heart (pericarditis).  Heart surgery.  Certain heart rhythm disorders, such as Wolf-Parkinson-White syndrome.  Other causes include:  Pneumonia.  Obstructive sleep apnea.  Blockage of an artery in the lungs (pulmonary embolism, or PE).  Lung cancer.  Chronic lung disease.  Thyroid problems, especially if the thyroid is overactive (hyperthyroidism).  Caffeine.  Excessive alcohol use or illegal drug use.  Use of some medicines, including certain decongestants and diet pills.  Sometimes, the cause cannot be found. What increases the risk? This condition is more likely to develop in:  People who are older in age.  People who smoke.  People who have diabetes mellitus.  People who are overweight (obese).  Athletes who exercise vigorously.  What are the signs or symptoms? Symptoms of this condition include:  A feeling that your heart is beating rapidly or irregularly.  A feeling of discomfort or pain in your chest.  Shortness of breath.  Sudden light-headedness or weakness.  Getting tired easily during exercise.  In some cases, there are no symptoms. How is this diagnosed? Your health care provider may be able to detect atrial fibrillation when taking your pulse. If detected, this condition may be diagnosed with:  An electrocardiogram (ECG).  A Holter monitor test that records your heartbeat patterns over a 24-hour period.  Transthoracic echocardiogram (TTE) to evaluate how blood flows through your heart.  Transesophageal echocardiogram (TEE) to view more detailed images of your heart.  A stress test.  Imaging tests, such  as a CT scan or chest X-ray.  Blood tests.  How is this treated? The main goals of treatment are to prevent blood clots from forming and to keep your heart beating at a normal rate and rhythm. The type of treatment that you  receive depends on many factors, such as your underlying medical conditions and how you feel when you are experiencing atrial fibrillation. This condition may be treated with:  Medicine to slow down the heart rate, bring the heart's rhythm back to normal, or prevent clots from forming.  Electrical cardioversion. This is a procedure that resets your heart's rhythm by delivering a controlled, low-energy shock to the heart through your skin.  Different types of ablation, such as catheter ablation, catheter ablation with pacemaker, or surgical ablation. These procedures destroy the heart tissues that send abnormal signals. When the pacemaker is used, it is placed under your skin to help your heart beat in a regular rhythm.  Follow these instructions at home:  Take over-the counter and prescription medicines only as told by your health care provider.  If your health care provider prescribed a blood-thinning medicine (anticoagulant), take it exactly as told. Taking too much blood-thinning medicine can cause bleeding. If you do not take enough blood-thinning medicine, you will not have the protection that you need against stroke and other problems.  Do not use tobacco products, including cigarettes, chewing tobacco, and e-cigarettes. If you need help quitting, ask your health care provider.  If you have obstructive sleep apnea, manage your condition as told by your health care provider.  Do not drink alcohol.  Do not drink beverages that contain caffeine, such as coffee, soda, and tea.  Maintain a healthy weight. Do not use diet pills unless your health care provider approves. Diet pills may make heart problems worse.  Follow diet instructions as told by your health care provider.  Exercise regularly as told by your health care provider.  Keep all follow-up visits as told by your health care provider. This is important. How is this prevented?  Avoid drinking beverages that contain caffeine  or alcohol.  Avoid certain medicines, especially medicines that are used for breathing problems.  Avoid certain herbs and herbal medicines, such as those that contain ephedra or ginseng.  Do not use illegal drugs, such as cocaine and amphetamines.  Do not smoke.  Manage your high blood pressure. Contact a health care provider if:  You notice a change in the rate, rhythm, or strength of your heartbeat.  You are taking an anticoagulant and you notice increased bruising.  You tire more easily when you exercise or exert yourself. Get help right away if:  You have chest pain, abdominal pain, sweating, or weakness.  You feel nauseous.  You notice blood in your vomit, bowel movement, or urine.  You have shortness of breath.  You suddenly have swollen feet and ankles.  You feel dizzy.  You have sudden weakness or numbness of the face, arm, or leg, especially on one side of the body.  You have trouble speaking, trouble understanding, or both (aphasia).  Your face or your eyelid droops on one side. These symptoms may represent a serious problem that is an emergency. Do not wait to see if the symptoms will go away. Get medical help right away. Call your local emergency services (911 in the [place] Do not drive yourself to the hospital. This information is not intended to replace advice given to you by your health care provider. Make sure  you discuss any questions you have with your health care provider. Document Released: 08/11/2005 Document Revised: 12/19/2015 Document Reviewed: 12/06/2014 Elsevier Interactive Patient Education  2017 [employer].

## 2017-04-22 NOTE — Progress Notes (Signed)
[facility]    rovider:  Dr Jaynee Eagles Referring Provider: Shon Baton, MD Primary Care Physician:  Shon Baton, MD CC: Stroke  Interval history 04/22/2017: Patient is here with her son for the first time. Spent an extended visit reviewing patient's clinical history, reviewing images of initial MRI and most recent MRI of the brain, reviewing echocardiogram and vascular imaging, discussing next and possibility of atrial fibrillation or other etiologies of clot, discussed stroke, treatment for atrial fibrillation and answered all questions.  Interval history 03/05/2017: Patient is here for follow-up of subacute strokes that were seen on MRI of the brain. She was started on aspirin 325 as well as hemoglobin A1c, she follows with PCP for cholesterol and advised LDL goal is less than 70. CTA of the head showed no intracranial or extracranial stenosis or occlusion of significance. Echocardiogram of the heart showed no PFO or thrombus.  Cardiac telemetry did not show atrial fibrillation.  UKG:URKYH L Dowtinis a 71 y.o.femalehere as a referral from Dr. Minta Balsam stroke. She has a history of palpitations. 10 years ago she had extensive palpitations. MRi results showed subacute strokes. She has a history of supraventricular tacycardia. No history of afib. PMHx OA, HLD, She was not on anti-platelet prior to dizziness. Dizziness since September, waxes and wanes, sometimes it is room spinning or imbalance. Currently she is fine but a week ago it was more imbalance when standing. She had a fall at [place], she was rushing and she fell and hit her head on th left side. She bit her lip. Otherwise she can;t remember any event that may have happened in the last several months coinciding with the MRI results. She tends to walk and catches her toe and has falls. She has had vestibular therapy but never for gait abnormality or falls. No other focal neurologic deficits, associated symptoms, inciting  events or modifiable factors.  Reviewed notes, labs and imaging from outside physicians, which showed:  Reviewed ENT notes. She presented with a chief complaint of dizziness and hearing loss. This started last fall when she started feeling imbalance and standing. There has been some room spinning. Onset was fairly sudden. PCP prescribed meclizine. The feeling comes and goes and goes away for a few weeks and comes back for a few weeks. Meclizine is helpful. The feeling is constant even when not moving. Head movements do not affect the feeling. She has white noise all the time that worsens. There is an associated pressure feeling in the head. When she stands the imbalance worsens. She has trouble hearing high-pitched sounds. She work with a physical therapist who did not find a problem. She has a remote history of migraines. Exam showed normal on exam, hearing test demonstrates symmetric sensorineural loss. Her imbalance symptoms not typical of an inner ear process. He recommended brain imaging.  Personally reviewed MRI images and agree with the following:  IMPRESSION: Four foci of low level restricted diffusion and contrast enhancement in the deep white matter of the left hemisphere probably consistent with late subacute small vessel infarctions. These could be embolic or watershed. No evidence of cortical infarction. The differential diagnosis does include demyelinating disease, but that is felt less likely.  Mild chronic small-vessel ischemic change of the hemispheric white matter in addition.  LDL 96 in 2016  Review of Systems: Patient complains of symptoms per HPI as well as the following symptoms:no CP, no SOB. Pertinent negatives and positives per HPI. All others negative.   Social History   Social History  .  Marital status: Married    Spouse name: N/A  . Number of children: N/A  . Years of education: N/A   Occupational History  . Not on file.   Social History Main Topics    . Smoking status: Never Smoker  . Smokeless tobacco: Never Used  . Alcohol use 3.0 oz/week    5 Glasses of wine per week  . Drug use: No  . Sexual activity: Not on file   Other Topics Concern  . Not on file   Social History Narrative   Lives at home    Family History  Problem Relation Age of Onset  . Heart attack Mother 20  . Emphysema Father        smoker  . Colon polyps Father   . Breast cancer Sister   . Cancer Paternal Grandfather        throat cancer  . Esophageal cancer Paternal Grandfather   . Heart attack Maternal Grandfather 76  . Diabetes Neg Hx   . Stroke Neg Hx   . Colon cancer Neg Hx   . Rectal cancer Neg Hx   . Stomach cancer Neg Hx     Past Medical History:  Diagnosis Date  . Allergy   . Cancer ([place])    skin cancer - on back, forehead  . Diverticulosis of colon 2007   Dr Olevia Perches  . History of basal cell carcinoma excision    X3  . History of colon polyps    NON-CANCEROUS  . History of kidney stones   . Hyperlipidemia   . OA (osteoarthritis of spine)   . Palpitations    tx with diltiazem  . PONV (postoperative nausea and vomiting)   . PSVT (paroxysmal supraventricular tachycardia) ([place])   . Right ureteral stone   . Urgency of urination   . Wears glasses     Past Surgical History:  Procedure Laterality Date  . APPENDECTOMY  1950  . 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 CLOSED MANIPULATION POST TOTAL  KNEE ARTHROPLASTY  07-13-2006  . COLONOSCOPY W/ POLYPECTOMY  2002     adenomatous; Dr Olevia Perches  . EXTRACORPOREAL SHOCK WAVE LITHOTRIPSY  X3  . FACELIFT W/BLEPHAROPLASTY  2005  . HYSTEROSCOPY W/D&C  12-22-2000  . OPEN KNEE MENISECTOMY  1964  . RIGHT URETEROSCOPIC STONE EXTRACTION / STENT PLACEMENT  08-21-2008  . rotator cuff on right shoulder    . TONSILLECTOMY  1950  . TOTAL KNEE ARTHROPLASTY Right 05-30-2006  . TUBAL LIGATION    . WISDOM TOOTH EXTRACTION       Current Outpatient Prescriptions  Medication Sig Dispense Refill  . alendronate (FOSAMAX) 70 MG tablet     . diclofenac sodium (VOLTAREN) 1 % GEL Apply 2 g topically 4 (four) times daily.    Marland Kitchen diltiazem (CARDIZEM) 120 MG tablet TAKE 1 TABLET TWICE DAILY 180 tablet 0  . fluticasone (FLONASE) 50 MCG/ACT nasal spray Place into both nostrils daily.    . furosemide (LASIX) 20 MG tablet TAKE ONE TABLET BY MOUTH DAILY AS NEEDED FOR SWELLING 30 tablet 1  . meclizine (ANTIVERT) 25 MG tablet Take 25 mg by mouth 3 (three) times daily as needed for dizziness.    Marland Kitchen MELATONIN PO Take by mouth at bedtime as needed.     . naproxen sodium (ANAPROX) 220 MG tablet Take 220 mg by mouth every morning.    . pravastatin (PRAVACHOL) 40 MG tablet Take 1  tablet (40 mg total) by mouth daily. 90 tablet 0   No current facility-administered medications for this visit.     Allergies as of 04/22/2017 - Review Complete 04/22/2017  Allergen Reaction Noted  . Morphine Other (See Comments) 01/19/2007  . Penicillins Rash 01/19/2007  . Levofloxacin Other (See Comments)     Vitals: BP 110/70   Pulse 73   Resp 16   Ht 5\' 7"  (1.702 m)   Wt 174 lb 3.2 oz (79 kg)   SpO2 94%   BMI 27.28 kg/m  Last Weight:  Wt Readings from Last 1 Encounters:  04/22/17 174 lb 3.2 oz (79 kg)   Last Height:   Ht Readings from Last 1 Encounters:  04/22/17 5\' 7"  (1.702 m)   Physical exam: Exam: Gen: NAD, conversant, well nourised, obese, well groomed                     CV: RRR, no MRG. No Carotid Bruits. No peripheral edema, warm, nontender Eyes: Conjunctivae clear without exudates or hemorrhage  Neuro: Detailed Neurologic Exam  Speech:    Speech is normal; fluent and spontaneous with normal comprehension.  Cognition:    The patient is oriented to person, place, and time;     recent and remote memory intact;     language fluent;     normal attention, concentration,     fund of knowledge Cranial Nerves:    The pupils  are equal, round, and reactive to light. The fundi are normal and spontaneous venous pulsations are present. Visual fields are full to finger confrontation. Extraocular movements are intact. Trigeminal sensation is intact and the muscles of mastication are normal. The face is symmetric. The palate elevates in the midline. Hearing intact. Voice is normal. Shoulder shrug is normal. The tongue has normal motion without fasciculations.   Coordination:    Normal finger to nose and heel to shin. Normal rapid alternating movements.   Gait:    Heel-toe and tandem gait are normal.   Motor Observation:    No asymmetry, no atrophy, and no involuntary movements noted. Tone:    Normal muscle tone.    Posture:    Posture is normal. normal erect    Strength:    Strength is V/V in the upper and lower limbs.      Sensation: intact to LT     Reflex Exam:  DTR's:    Deep tendon reflexes in the upper and lower extremities are normal bilaterally.   Toes:    The toes are downgoing bilaterally.   Clonus:    Clonus is absent.   Assessment/Plan:71 year old with chronic dizziness and imbalance for 6-7 months. MRI showed foci of low level restricted diffusion and contrast enhancement in the deep white matter of the left hemisphere probably consistent with late subacute embolic infarctions.   Workup including CTA head and neck, echo, holter monitor unrevealing.  Patient will need loop recorder, will refer to Dr. Joylene Grapes. Physical therapy for gait and imbalance, falls  ASA 325mg  daily  Sarina Ill, MD  [facility] [address] [place], [place] [address]  Phone [phone] Fax [phone]  A total of 45 minutes was spent face-to-face with this patient. Over half this time was spent on counseling patient on the embolic strokes diagnosis and different diagnostic and therapeutic options available, reviewing diagnostics results, labs, instructions for management  and follow-up.

## 2017-05-20 ENCOUNTER — Ambulatory Visit (INDEPENDENT_AMBULATORY_CARE_PROVIDER_SITE_OTHER): Payer: Medicare Other | Admitting: Internal Medicine

## 2017-05-20 ENCOUNTER — Encounter: Payer: Self-pay | Admitting: Internal Medicine

## 2017-05-20 VITALS — BP 108/76 | HR 66 | Ht 67.5 in | Wt 165.4 lb

## 2017-05-20 DIAGNOSIS — I471 Supraventricular tachycardia: Secondary | ICD-10-CM | POA: Diagnosis not present

## 2017-05-20 DIAGNOSIS — I639 Cerebral infarction, unspecified: Secondary | ICD-10-CM

## 2017-05-20 DIAGNOSIS — I5032 Chronic diastolic (congestive) heart failure: Secondary | ICD-10-CM

## 2017-05-20 NOTE — Patient Instructions (Addendum)
Medication Instructions:  Your physician recommends that you continue on your current medications as directed. Please refer to the Current Medication list given to you today.   Labwork: None ordered   Testing/Procedures: LINQ implant---05/21/17  Please arrive at [facility] at 6:30am     Follow-Up:  Your physician recommends that you schedule a follow-up appointment in: 10-14 days to device clinic for wound check.   Any Other Special Instructions Will Be Listed Below (If Applicable).     If you need a refill on your cardiac medications before your next appointment, please call your pharmacy.

## 2017-05-20 NOTE — Progress Notes (Signed)
ELECTROPHYSIOLOGY CONSULT NOTE  Patient ID: Sarah Valdez MRN: [id number], DOB/AGE: March 24, 1946   Admit date: (Not on file) Date of Consult: 05/20/2017  Primary Physician: Shon Baton, MD   Reason for Consultation: Cryptogenic stroke; recommendations regarding Implantable Loop Recorder, consult requested by Dr Jaynee Eagles  History of Present Illness Sarah Valdez has had vertigo and positional dizziness for which she has been previously evaluated by Dr Jaynee Eagles.  She is felt to have cryptogenic stroke as the cause.   she has been monitored on telemetry which has demonstrated no arrhythmias. No cause has been identified.  EP has been asked to evaluate for placement of an implantable loop recorder to monitor for atrial fibrillation.  She does have occasional palpitations.  She had SVT for years (Adenosine sensitive) for which she has seen EP in the past and was offered ablation.  She declined ablation and has had no further episodes with diltiazem.  She remains active. She has occasional swelling of her legs but is otherwise without complaint today.  Past Medical History Past Medical History:  Diagnosis Date  . Allergy   . Cancer ([place])    skin cancer - on back, forehead  . Cryptogenic stroke [facility])    followed by Dr Jaynee Eagles  . Diverticulosis of colon 2007   Dr Olevia Perches  . History of basal cell carcinoma excision    X3  . History of colon polyps    NON-CANCEROUS  . History of kidney stones   . Hyperlipidemia   . OA (osteoarthritis of spine)   . PONV (postoperative nausea and vomiting)   . PSVT (paroxysmal supraventricular tachycardia) ([facility])    adenosine sensitive SVT previously documented  . Right ureteral stone   . Urgency of urination   . Wears glasses     Past Surgical History Past Surgical History:  Procedure Laterality Date  . APPENDECTOMY  1950  . 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 CLOSED MANIPULATION  POST TOTAL  KNEE ARTHROPLASTY  07-13-2006  . COLONOSCOPY W/ POLYPECTOMY  2002     adenomatous; Dr Olevia Perches  . EXTRACORPOREAL SHOCK WAVE LITHOTRIPSY  X3  . FACELIFT W/BLEPHAROPLASTY  2005  . HYSTEROSCOPY W/D&C  12-22-2000  . OPEN KNEE MENISECTOMY  1964  . RIGHT URETEROSCOPIC STONE EXTRACTION / STENT PLACEMENT  08-21-2008  . rotator cuff on right shoulder    . TONSILLECTOMY  1950  . TOTAL KNEE ARTHROPLASTY Right 05-30-2006  . TUBAL LIGATION    . WISDOM TOOTH EXTRACTION      Allergies/Intolerances Allergies  Allergen Reactions  . Morphine Other (See Comments)    Respiratory compromise post op  . Penicillins Rash     urticaria  . Levofloxacin Other (See Comments)     SHOULDER PAIN   Medicines reviewed  SH- mother and maternal grandfather died suddenly in their 75s of unknown cause  Social History Social History   Social History  . Marital status: Married    Spouse name: N/A  . Number of children: N/A  . Years of education: N/A   Occupational History  . Not on file.   Social History Main Topics  . Smoking status: Never Smoker  . Smokeless tobacco: Never Used  . Alcohol use 3.0 oz/week    5 Glasses of wine per week  . Drug use: No  . Sexual activity: Not on file   Other Topics Concern  . Not on file   Social History  Narrative   Lives at home in [place] with spouse.    Retired Careers information officer.   Active in the community.    Review of Systems General: No chills, fever, night sweats or weight changes  Cardiovascular:  No chest pain, dyspnea on exertion, edema, orthopnea, palpitations, paroxysmal nocturnal dyspnea Dermatological: No rash, lesions or masses Respiratory: No cough, dyspnea Urologic: No hematuria, dysuria Abdominal: No nausea, vomiting, diarrhea, bright red blood per rectum, melena, or hematemesis Neurologic: No visual changes, weakness, changes in mental status All other systems reviewed and are otherwise negative except as noted above.  Physical  Exam Blood pressure 108/76, pulse 66, height 5' 7.5" (1.715 m), weight 165 lb 6.4 oz (75 kg), SpO2 96 %.  General: Well developed, well appearing 71 y.o. female in no acute distress. HEENT: Normocephalic, atraumatic. EOMs intact. Sclera nonicteric. Oropharynx clear.  Neck: Supple without bruits. No JVD. Lungs: Respirations regular and unlabored, CTA bilaterally. No wheezes, rales or rhonchi. Heart: RRR. S1, S2 present. No murmurs, rub, S3 or S4. Abdomen: Soft, non-tender, non-distended. BS present x 4 quadrants. No hepatosplenomegaly.  Extremities: No clubbing, cyanosis or edema. DP/PT/Radials 2+ and equal bilaterally. Psych: Normal affect. Neuro: Alert and oriented X 3. Moves all extremities spontaneously. Musculoskeletal: No kyphosis. Skin: Intact. Warm and dry. No rashes or petechiae in exposed areas.   Labs Lab Results  Component Value Date   WBC 6.3 07/05/2015   HGB 14.9 07/05/2015   HCT 44.2 07/05/2015   MCV 94.2 07/05/2015   PLT 222.0 07/05/2015   No results for input(s): NA, K, CL, CO2, BUN, CREATININE, CALCIUM, PROT, BILITOT, ALKPHOS, ALT, AST, GLUCOSE in the last 168 hours.  Invalid input(s): LABALBU No results for input(s): INR in the last 72 hours.  Radiology/Studies No results found.  Echocardiogram  REVIEWED  12-lead ECG sinus rhythm 66 bpm, nonspecific ST/T [place] Holter - reviewed by me and showed no afib   Assessment and Plan 1. Cryptogenic stroke The patient has concerns for cryptogenic stroke.  There is concern for afib as the cause.  I agree with Dr Jaynee Eagles that long term monitoring is necessary to evaluate for atrial arrhythmias which would require anticoagulation.  The indication for loop recorder insertion / monitoring for AF in setting of cryptogenic stroke was discussed with the patient. The loop recorder insertion procedure was reviewed in detail including risks and benefits. These risks include but are not limited to bleeding and infection. The patient  expressed verbal understanding and agrees to proceed. The patient was also counseled regarding wound care and device follow-up.  We will schedule the procedure at the next available time.  2. SVT Adenosine sensitive Controlled with diltiazem Not interested in ablation  3. Chronic diastolic dysfunction Echo is reviewed Low salt diet encouraged  Signed, Thompson Grayer 05/20/2017, 11:27 AM  ;

## 2017-05-21 ENCOUNTER — Encounter (HOSPITAL_COMMUNITY)
Admission: RE | Disposition: A | Discharge status: Home / Self Care | Payer: Self-pay | Source: Ambulatory Visit | Attending: Internal Medicine

## 2017-05-21 ENCOUNTER — Encounter (HOSPITAL_COMMUNITY): Payer: Self-pay | Admitting: Internal Medicine

## 2017-05-21 ENCOUNTER — Ambulatory Visit (HOSPITAL_COMMUNITY)
Admission: RE | Admit: 2017-05-21 | Discharge: 2017-05-21 | Disposition: A | Discharge status: Home / Self Care | Payer: Medicare Other | Source: Ambulatory Visit | Attending: Internal Medicine | Admitting: Internal Medicine

## 2017-05-21 DIAGNOSIS — E785 Hyperlipidemia, unspecified: Secondary | ICD-10-CM | POA: Insufficient documentation

## 2017-05-21 DIAGNOSIS — I638 Other cerebral infarction: Secondary | ICD-10-CM | POA: Diagnosis not present

## 2017-05-21 DIAGNOSIS — I471 Supraventricular tachycardia: Secondary | ICD-10-CM | POA: Diagnosis not present

## 2017-05-21 DIAGNOSIS — Z885 Allergy status to narcotic agent status: Secondary | ICD-10-CM | POA: Diagnosis not present

## 2017-05-21 DIAGNOSIS — Z880 Allergy status to penicillin: Secondary | ICD-10-CM | POA: Diagnosis not present

## 2017-05-21 HISTORY — PX: LOOP RECORDER INSERTION: EP1214

## 2017-05-21 SURGERY — LOOP RECORDER INSERTION

## 2017-05-21 MED ORDER — LIDOCAINE-EPINEPHRINE 1 %-1:100000 IJ SOLN
INTRAMUSCULAR | Status: AC
  Filled 2017-05-21: qty 1

## 2017-05-21 SURGICAL SUPPLY — 2 items
LOOP REVEAL LINQSYS (Prosthesis & Implant Heart) ×2 IMPLANT
PACK LOOP INSERTION (CUSTOM PROCEDURE TRAY) ×3 IMPLANT

## 2017-05-21 NOTE — Interval H&P Note (Signed)
History and Physical Interval Note:  05/21/2017 7:31 AM  [name]  has presented today for surgery, with the diagnosis of stroke  The various methods of treatment have been discussed with the patient and family. After consideration of risks, benefits and other options for treatment, the patient has consented to  Procedure(s): LOOP RECORDER INSERTION (N/A) as a surgical intervention .  The patient's history has been reviewed, patient examined, no change in status, stable for surgery.  I have reviewed the patient's chart and labs.  Questions were answered to the patient's satisfaction.     Sarah Valdez

## 2017-05-21 NOTE — H&P (View-Only) (Signed)
ELECTROPHYSIOLOGY CONSULT NOTE  Patient ID: Sarah Valdez MRN: [id number], DOB/AGE: Oct 31, 1945   Admit date: (Not on file) Date of Consult: 05/20/2017  Primary Physician: Shon Baton, MD   Reason for Consultation: Cryptogenic stroke; recommendations regarding Implantable Loop Recorder, consult requested by Dr Jaynee Eagles  History of Present Illness LOANY NEUROTH has had vertigo and positional dizziness for which she has been previously evaluated by Dr Jaynee Eagles.  She is felt to have cryptogenic stroke as the cause.   she has been monitored on telemetry which has demonstrated no arrhythmias. No cause has been identified.  EP has been asked to evaluate for placement of an implantable loop recorder to monitor for atrial fibrillation.  She does have occasional palpitations.  She had SVT for years (Adenosine sensitive) for which she has seen EP in the past and was offered ablation.  She declined ablation and has had no further episodes with diltiazem.  She remains active. She has occasional swelling of her legs but is otherwise without complaint today.  Past Medical History Past Medical History:  Diagnosis Date  . Allergy   . Cancer ([place])    skin cancer - on back, forehead  . Cryptogenic stroke [facility])    followed by Dr Jaynee Eagles  . Diverticulosis of colon 2007   Dr Olevia Perches  . History of basal cell carcinoma excision    X3  . History of colon polyps    NON-CANCEROUS  . History of kidney stones   . Hyperlipidemia   . OA (osteoarthritis of spine)   . PONV (postoperative nausea and vomiting)   . PSVT (paroxysmal supraventricular tachycardia) (HCC)    adenosine sensitive SVT previously documented  . Right ureteral stone   . Urgency of urination   . Wears glasses     Past Surgical History Past Surgical History:  Procedure Laterality Date  . APPENDECTOMY  1950  . 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 CLOSED MANIPULATION  POST TOTAL  KNEE ARTHROPLASTY  07-13-2006  . COLONOSCOPY W/ POLYPECTOMY  2002     adenomatous; Dr Olevia Perches  . EXTRACORPOREAL SHOCK WAVE LITHOTRIPSY  X3  . FACELIFT W/BLEPHAROPLASTY  2005  . HYSTEROSCOPY W/D&C  12-22-2000  . OPEN KNEE MENISECTOMY  1964  . RIGHT URETEROSCOPIC STONE EXTRACTION / STENT PLACEMENT  08-21-2008  . rotator cuff on right shoulder    . TONSILLECTOMY  1950  . TOTAL KNEE ARTHROPLASTY Right 05-30-2006  . TUBAL LIGATION    . WISDOM TOOTH EXTRACTION      Allergies/Intolerances Allergies  Allergen Reactions  . Morphine Other (See Comments)    Respiratory compromise post op  . Penicillins Rash     urticaria  . Levofloxacin Other (See Comments)     SHOULDER PAIN   Medicines reviewed  SH- mother and maternal grandfather died suddenly in their 42s of unknown cause  Social History Social History   Social History  . Marital status: Married    Spouse name: N/A  . Number of children: N/A  . Years of education: N/A   Occupational History  . Not on file.   Social History Main Topics  . Smoking status: Never Smoker  . Smokeless tobacco: Never Used  . Alcohol use 3.0 oz/week    5 Glasses of wine per week  . Drug use: No  . Sexual activity: Not on file   Other Topics Concern  . Not on file   Social History  Narrative   Lives at home in [place] with spouse.    Retired Careers information officer.   Active in the community.    Review of Systems General: No chills, fever, night sweats or weight changes  Cardiovascular:  No chest pain, dyspnea on exertion, edema, orthopnea, palpitations, paroxysmal nocturnal dyspnea Dermatological: No rash, lesions or masses Respiratory: No cough, dyspnea Urologic: No hematuria, dysuria Abdominal: No nausea, vomiting, diarrhea, bright red blood per rectum, melena, or hematemesis Neurologic: No visual changes, weakness, changes in mental status All other systems reviewed and are otherwise negative except as noted above.  Physical  Exam Blood pressure 108/76, pulse 66, height 5' 7.5" (1.715 m), weight 165 lb 6.4 oz (75 kg), SpO2 96 %.  General: Well developed, well appearing 71 y.o. female in no acute distress. HEENT: Normocephalic, atraumatic. EOMs intact. Sclera nonicteric. Oropharynx clear.  Neck: Supple without bruits. No JVD. Lungs: Respirations regular and unlabored, CTA bilaterally. No wheezes, rales or rhonchi. Heart: RRR. S1, S2 present. No murmurs, rub, S3 or S4. Abdomen: Soft, non-tender, non-distended. BS present x 4 quadrants. No hepatosplenomegaly.  Extremities: No clubbing, cyanosis or edema. DP/PT/Radials 2+ and equal bilaterally. Psych: Normal affect. Neuro: Alert and oriented X 3. Moves all extremities spontaneously. Musculoskeletal: No kyphosis. Skin: Intact. Warm and dry. No rashes or petechiae in exposed areas.   Labs Lab Results  Component Value Date   WBC 6.3 07/05/2015   HGB 14.9 07/05/2015   HCT 44.2 07/05/2015   MCV 94.2 07/05/2015   PLT 222.0 07/05/2015   No results for input(s): NA, K, CL, CO2, BUN, CREATININE, CALCIUM, PROT, BILITOT, ALKPHOS, ALT, AST, GLUCOSE in the last 168 hours.  Invalid input(s): LABALBU No results for input(s): INR in the last 72 hours.  Radiology/Studies No results found.  Echocardiogram  REVIEWED  12-lead ECG sinus rhythm 66 bpm, nonspecific ST/T [place] Holter - reviewed by me and showed no afib   Assessment and Plan 1. Cryptogenic stroke The patient has concerns for cryptogenic stroke.  There is concern for afib as the cause.  I agree with Dr Jaynee Eagles that long term monitoring is necessary to evaluate for atrial arrhythmias which would require anticoagulation.  The indication for loop recorder insertion / monitoring for AF in setting of cryptogenic stroke was discussed with the patient. The loop recorder insertion procedure was reviewed in detail including risks and benefits. These risks include but are not limited to bleeding and infection. The patient  expressed verbal understanding and agrees to proceed. The patient was also counseled regarding wound care and device follow-up.  We will schedule the procedure at the next available time.  2. SVT Adenosine sensitive Controlled with diltiazem Not interested in ablation  3. Chronic diastolic dysfunction Echo is reviewed Low salt diet encouraged  Signed, Thompson Grayer 05/20/2017, 11:27 AM  ;

## 2017-05-21 NOTE — Discharge Instructions (Signed)
LOOP RECORDER INSTRUCTION SHEET GIVEN. °

## 2017-05-28 ENCOUNTER — Other Ambulatory Visit: Payer: Self-pay | Admitting: Internal Medicine

## 2017-05-28 DIAGNOSIS — Z1231 Encounter for screening mammogram for malignant neoplasm of breast: Secondary | ICD-10-CM

## 2017-06-03 ENCOUNTER — Ambulatory Visit (INDEPENDENT_AMBULATORY_CARE_PROVIDER_SITE_OTHER): Payer: Self-pay | Admitting: *Deleted

## 2017-06-03 DIAGNOSIS — I639 Cerebral infarction, unspecified: Secondary | ICD-10-CM

## 2017-06-03 LAB — CUP PACEART INCLINIC DEVICE CHECK
Implantable Pulse Generator Implant Date: 20180927
MDC IDC SESS DTM: 20181010160848

## 2017-06-03 NOTE — Progress Notes (Signed)
Wound check appointment. Steri-strips removed by patient. Wound without redness or edema. Incision edges approximated, wound well healed. Loop check in clinic. Battery status: good. R-waves 0.44 mV. 0 symptom episodes, 0 tachy episodes, 0 pause episodes, 0 brady episodes. 0 AF episodes. Monthly summary reports and ROV with JA PRN

## 2017-06-11 DIAGNOSIS — Z23 Encounter for immunization: Secondary | ICD-10-CM | POA: Diagnosis not present

## 2017-06-15 ENCOUNTER — Ambulatory Visit
Admission: RE | Admit: 2017-06-15 | Discharge: 2017-06-15 | Disposition: A | Discharge status: Home / Self Care | Payer: Medicare Other | Source: Ambulatory Visit | Attending: Internal Medicine | Admitting: Internal Medicine

## 2017-06-15 DIAGNOSIS — Z1231 Encounter for screening mammogram for malignant neoplasm of breast: Secondary | ICD-10-CM | POA: Diagnosis not present

## 2017-06-22 ENCOUNTER — Ambulatory Visit (INDEPENDENT_AMBULATORY_CARE_PROVIDER_SITE_OTHER): Payer: Medicare Other | Admitting: *Deleted

## 2017-06-22 DIAGNOSIS — I639 Cerebral infarction, unspecified: Secondary | ICD-10-CM | POA: Diagnosis not present

## 2017-06-22 NOTE — Progress Notes (Signed)
Carelink Summary Report / Loop Recorder 

## 2017-06-26 LAB — CUP PACEART REMOTE DEVICE CHECK
Implantable Pulse Generator Implant Date: 20180927
MDC IDC SESS DTM: 20181027113637

## 2017-07-07 ENCOUNTER — Emergency Department (HOSPITAL_COMMUNITY): Payer: Medicare Other

## 2017-07-07 ENCOUNTER — Emergency Department (HOSPITAL_COMMUNITY)
Admission: EM | Admit: 2017-07-07 | Discharge: 2017-07-07 | Disposition: A | Discharge status: Home / Self Care | Payer: Medicare Other | Attending: Emergency Medicine | Admitting: Emergency Medicine

## 2017-07-07 ENCOUNTER — Telehealth: Payer: Self-pay | Admitting: *Deleted

## 2017-07-07 ENCOUNTER — Telehealth: Payer: Self-pay | Admitting: Neurology

## 2017-07-07 ENCOUNTER — Other Ambulatory Visit: Payer: Self-pay

## 2017-07-07 ENCOUNTER — Encounter (HOSPITAL_COMMUNITY): Payer: Self-pay | Admitting: Emergency Medicine

## 2017-07-07 DIAGNOSIS — Z85828 Personal history of other malignant neoplasm of skin: Secondary | ICD-10-CM | POA: Insufficient documentation

## 2017-07-07 DIAGNOSIS — R42 Dizziness and giddiness: Secondary | ICD-10-CM | POA: Insufficient documentation

## 2017-07-07 DIAGNOSIS — Z885 Allergy status to narcotic agent status: Secondary | ICD-10-CM | POA: Diagnosis not present

## 2017-07-07 DIAGNOSIS — Z880 Allergy status to penicillin: Secondary | ICD-10-CM | POA: Insufficient documentation

## 2017-07-07 DIAGNOSIS — Z79899 Other long term (current) drug therapy: Secondary | ICD-10-CM | POA: Diagnosis not present

## 2017-07-07 DIAGNOSIS — Z8673 Personal history of transient ischemic attack (TIA), and cerebral infarction without residual deficits: Secondary | ICD-10-CM | POA: Insufficient documentation

## 2017-07-07 DIAGNOSIS — Z7982 Long term (current) use of aspirin: Secondary | ICD-10-CM | POA: Insufficient documentation

## 2017-07-07 DIAGNOSIS — Z8249 Family history of ischemic heart disease and other diseases of the circulatory system: Secondary | ICD-10-CM | POA: Diagnosis not present

## 2017-07-07 LAB — DIFFERENTIAL
BASOS ABS: 0 10*3/uL (ref 0.0–0.1)
Basophils Relative: 1 %
EOS PCT: 2 %
Eosinophils Absolute: 0.2 10*3/uL (ref 0.0–0.7)
LYMPHS ABS: 2.6 10*3/uL (ref 0.7–4.0)
LYMPHS PCT: 37 %
Monocytes Absolute: 0.5 10*3/uL (ref 0.1–1.0)
Monocytes Relative: 8 %
NEUTROS ABS: 3.7 10*3/uL (ref 1.7–7.7)
NEUTROS PCT: 52 %

## 2017-07-07 LAB — COMPREHENSIVE METABOLIC PANEL
ALBUMIN: 3.9 g/dL (ref 3.5–5.0)
ALK PHOS: 56 U/L (ref 38–126)
ALT: 16 U/L (ref 14–54)
AST: 23 U/L (ref 15–41)
Anion gap: 7 (ref 5–15)
BILIRUBIN TOTAL: 0.8 mg/dL (ref 0.3–1.2)
BUN: 21 mg/dL — AB (ref 6–20)
CALCIUM: 9.2 mg/dL (ref 8.9–10.3)
CO2: 22 mmol/L (ref 22–32)
CREATININE: 0.91 mg/dL (ref 0.44–1.00)
Chloride: 110 mmol/L (ref 101–111)
GFR calc Af Amer: >60 mL/min (ref >60)
GFR calc non Af Amer: >60 mL/min (ref >60)
GLUCOSE: 113 mg/dL — AB (ref 65–99)
Potassium: 3.7 mmol/L (ref 3.5–5.1)
Sodium: 139 mmol/L (ref 135–145)
TOTAL PROTEIN: 6.8 g/dL (ref 6.5–8.1)

## 2017-07-07 LAB — CBC
HEMATOCRIT: 44.5 % (ref 36.0–46.0)
HEMOGLOBIN: 14.7 g/dL (ref 12.0–15.0)
MCH: 31.5 pg (ref 26.0–34.0)
MCHC: 33 g/dL (ref 30.0–36.0)
MCV: 95.5 fL (ref 78.0–100.0)
Platelets: 205 10*3/uL (ref 150–400)
RBC: 4.66 MIL/uL (ref 3.87–5.11)
RDW: 14.4 % (ref 11.5–15.5)
WBC: 7 10*3/uL (ref 4.0–10.5)

## 2017-07-07 LAB — I-STAT CHEM 8, ED
BUN: 24 mg/dL — AB (ref 6–20)
CALCIUM ION: 1.17 mmol/L (ref 1.15–1.40)
CHLORIDE: 109 mmol/L (ref 101–111)
CREATININE: 0.7 mg/dL (ref 0.44–1.00)
Glucose, Bld: 111 mg/dL — ABNORMAL HIGH (ref 65–99)
HCT: 45 % (ref 36.0–46.0)
Hemoglobin: 15.3 g/dL — ABNORMAL HIGH (ref 12.0–15.0)
Potassium: 3.6 mmol/L (ref 3.5–5.1)
Sodium: 141 mmol/L (ref 135–145)
TCO2: 22 mmol/L (ref 22–32)

## 2017-07-07 LAB — PROTIME-INR
INR: 0.91
Prothrombin Time: 12.1 seconds (ref 11.4–15.2)

## 2017-07-07 LAB — APTT: APTT: 25 s (ref 24–36)

## 2017-07-07 LAB — I-STAT TROPONIN, ED: Troponin i, poc: 0 ng/mL (ref 0.00–0.08)

## 2017-07-07 MED ORDER — DIAZEPAM 2 MG PO TABS
1.0000 mg | ORAL_TABLET | Freq: Once | ORAL | Status: AC
  Administered 2017-07-07: 1 mg via ORAL
  Filled 2017-07-07: qty 1

## 2017-07-07 MED ORDER — DIAZEPAM 2 MG PO TABS: 1.0000 mg | ORAL_TABLET | Freq: Four times a day (QID) | ORAL | 0 refills | Status: DC | PRN

## 2017-07-07 NOTE — Telephone Encounter (Signed)
Call 911 and go to the hospital thanks

## 2017-07-07 NOTE — ED Notes (Signed)
Ambulated patient to restroom, took about 5 sec upon initially standing to allow her dizziness to pass, then able to walk with no issues, she was able to sit on her own.  After urinating, patient stood up and experienced dizziness again for about 5 sec, then was able to walk with no issues back to room.  Patient resting comfortably in bed.

## 2017-07-07 NOTE — ED Notes (Signed)
1mg  (1/2 tablet) wasted in sharps with Lexine Baton RN witnessed.

## 2017-07-07 NOTE — Telephone Encounter (Signed)
Called and spoke with patient. She reports that she was diagnosed with strokes in July. She has a history of dizziness & imbalance which had resolved. However, 2 weeks ago the symptoms started again and have worsened. I reviewed stroke symptoms with her including unilateral weakness, speech changes, vision changes, nausea, severe headache, imbalance and dizziness can also be signs of stroke, and advised she call 911 if she is concerned of stroke. She wanted to see what Dr. Jaynee Eagles thought.

## 2017-07-07 NOTE — ED Provider Notes (Signed)
[facility] EMERGENCY DEPARTMENT Provider Note  CSN: [id number] Arrival date & time: 07/07/17 1245  Chief Complaint(s) Dizziness  HPI Sarah Valdez is a 71 y.o. female with a history of prior strokes presents to the emergency department with 2 weeks of intermittent vertiginous symptoms that come on sporadically, lasting only a few seconds and spontaneously resolve.  Patient also states that at times her symptoms are brought on by head turning, changing of positions or standing from sitting position.  Again the symptoms only last for a few seconds  and cannot speak and spontaneously resolve.  She denies any vision changes, difficulty speaking, swallowing, focal weakness, loss of sensation.  She does have difficulty ambulating during the brief period of time when her symptoms come on however when they resolved she has no difficulty with ambulation.  She denies any recent fevers or infections.  Denies any head trauma.  Has tried taking Antivert without significant improvement.  Patient discuss her symptoms with her neurologist who recommended she present to the emergency department for further evaluation.  HPI  Past Medical History Past Medical History:  Diagnosis Date  . Allergy   . Cancer ([place])    skin cancer - on back, forehead  . Cryptogenic stroke [facility])    followed by Dr Jaynee Eagles  . Diverticulosis of colon 2007   Dr Olevia Perches  . History of basal cell carcinoma excision    X3  . History of colon polyps    NON-CANCEROUS  . History of kidney stones   . Hyperlipidemia   . OA (osteoarthritis of spine)   . PONV (postoperative nausea and vomiting)   . PSVT (paroxysmal supraventricular tachycardia) (HCC)    adenosine sensitive SVT previously documented  . Right ureteral stone   . Urgency of urination   . Wears glasses    Patient Active Problem List   Diagnosis Date Noted  . Cryptogenic stroke ([place]) 12/27/2016  . Bilateral sensorineural hearing loss 11/06/2016  .  Imbalance 11/06/2016  . Hepatic steatosis 01/20/2014  . Gallstones 01/20/2014  . Pancreatic cyst 01/20/2014  . Abnormal CT scan, liver 01/02/2014  . Back spasm 06/10/2013  . Osteopenia 09/20/2012  . COUGH VARIANT ASTHMA 02/11/2010  . Diverticulosis of large intestine 12/25/2009  . Wrist osteonecrosis 12/25/2009  . Vitamin D deficiency 11/28/2008  . NEPHROLITHIASIS, HX OF 11/28/2008  . Dyslipidemia 11/24/2007  . Allergic rhinitis, cause unspecified 11/24/2007  . PALPITATIONS 11/24/2007  . DEGENERATIVE DISC DISEASE 01/19/2007  . SKIN CANCER, HX OF 01/19/2007  . COLONIC POLYPS, HX OF 01/19/2007   Home Medication(s) Prior to Admission medications   Medication Sig Start Date End Date Taking? Authorizing Provider  alendronate (FOSAMAX) 70 MG tablet Take 70 mg by mouth once a week. Wednesdays 01/19/17  Yes [provider]  aspirin 325 MG tablet Take 325 mg by mouth at bedtime.   Yes [provider]  cetirizine (ZYRTEC) 10 MG tablet Take 10 mg by mouth daily.    Yes [provider]  Cholecalciferol (VITAMIN D) 2000 units CAPS Take 1 capsule at bedtime by mouth.    Yes [provider]  diltiazem (CARDIZEM) 120 MG tablet TAKE 1 TABLET TWICE DAILY Patient taking differently: Take 120 mg 2 (two) times daily by mouth. TAKE 1 TABLET TWICE DAILY 10/31/15  Yes Burns, Claudina Lick, MD  fluticasone (FLONASE) 50 MCG/ACT nasal spray Place 1 spray into both nostrils daily as needed for allergies.    Yes [provider]  meclizine (ANTIVERT) 25 MG  tablet Take 25 mg by mouth 3 (three) times daily as needed for dizziness.   Yes [provider]  pravastatin (PRAVACHOL) 40 MG tablet Take 1 tablet (40 mg total) by mouth daily. Patient taking differently: Take 40 mg at bedtime by mouth.  10/31/15  Yes Burns, Claudina Lick, MD  diazepam (VALIUM) 2 MG tablet Take 0.5-1 tablets (1-2 mg total) every 6 (six) hours as needed by mouth (vertigo). 07/07/17   Fatima Blank,  MD                                                                                                                                    Past Surgical History Past Surgical History:  Procedure Laterality Date  . APPENDECTOMY  1950  . 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 CLOSED MANIPULATION POST TOTAL  KNEE ARTHROPLASTY  07-13-2006  . COLONOSCOPY W/ POLYPECTOMY  2002     adenomatous; Dr Olevia Perches  . EXTRACORPOREAL SHOCK WAVE LITHOTRIPSY  X3  . FACELIFT W/BLEPHAROPLASTY  2005  . HYSTEROSCOPY W/D&C  12-22-2000  . OPEN KNEE MENISECTOMY  1964  . RIGHT URETEROSCOPIC STONE EXTRACTION / STENT PLACEMENT  08-21-2008  . rotator cuff on right shoulder    . TONSILLECTOMY  1950  . TOTAL KNEE ARTHROPLASTY Right 05-30-2006  . TUBAL LIGATION    . WISDOM TOOTH EXTRACTION     Family History Family History  Problem Relation Age of Onset  . Heart attack Mother 47  . Emphysema Father        smoker  . Colon polyps Father   . Breast cancer Sister   . Cancer Paternal Grandfather        throat cancer  . Esophageal cancer Paternal Grandfather   . Heart attack Maternal Grandfather 76  . Diabetes Neg Hx   . Stroke Neg Hx   . Colon cancer Neg Hx   . Rectal cancer Neg Hx   . Stomach cancer Neg Hx     Social History Social History   Tobacco Use  . Smoking status: Never Smoker  . Smokeless tobacco: Never Used  Substance Use Topics  . Alcohol use: Yes    Alcohol/week: 3.0 oz    Types: 5 Glasses of wine per week  . Drug use: No   Allergies Morphine; Penicillins; and Levofloxacin  Review of Systems Review of Systems All other systems are reviewed and are negative for acute change except as noted in the HPI  Physical Exam Vital Signs  I have reviewed the triage vital signs BP 114/70   Pulse 69   Temp 97.6 F (36.4 C) (Oral)   Resp 14   Ht 5' 7.5" (1.715 m)   Wt 73.9 kg (163 lb)   SpO2 96%   BMI 25.15 kg/m    Physical Exam  Constitutional: She is oriented to person, place, and time.  She appears well-developed and well-nourished. No distress.  HENT:  Head: Normocephalic and atraumatic.  Nose: Nose normal.  Eyes: Conjunctivae and EOM are normal. Pupils are equal, round, and reactive to light. Right eye exhibits no discharge. Left eye exhibits no discharge. No scleral icterus.  Neck: Normal range of motion. Neck supple.  Cardiovascular: Normal rate and regular rhythm. Exam reveals no gallop and no friction rub.  No murmur heard. Pulmonary/Chest: Effort normal and breath sounds normal. No stridor. No respiratory distress. She has no rales.  Abdominal: Soft. She exhibits no distension. There is no tenderness.  Musculoskeletal: She exhibits no edema or tenderness.  Neurological: She is alert and oriented to person, place, and time.  Mental Status:  Alert and oriented to person, place, and time.  Attention and concentration normal.  Speech clear.  Recent memory is intact  Cranial Nerves:  II Visual Fields: Intact to confrontation. Visual fields intact. III, IV, VI: Pupils equal and reactive to light and near. Full eye movement with lateral nystagmus  V Facial Sensation: Normal. No weakness of masticatory muscles  VII: No facial weakness or asymmetry  VIII Auditory Acuity: Grossly normal  IX/X: The uvula is midline; the palate elevates symmetrically  XI: Normal sternocleidomastoid and trapezius strength  XII: The tongue is midline. No atrophy or fasciculations.   Motor System: Muscle Strength: 5/5 and symmetric in the upper and lower extremities. No pronation or drift.  Muscle Tone: Tone and muscle bulk are normal in the upper and lower extremities.   Reflexes: DTRs: 1+ and symmetrical in all four extremities. No Clonus Coordination: Intact finger-to-nose, heel-to-shin. No tremor.  Sensation: Intact to light touch, and pinprick. Negative Romberg test.  Gait: Routine and tandem gait  normal.  HINTS: Nystagmus: lateral nystagmus Head impulse. Abnormal with right head impulse Skew: normal   Skin: Skin is warm and dry. No rash noted. She is not diaphoretic. No erythema.  Psychiatric: She has a normal mood and affect.  Vitals reviewed.   ED Results and Treatments Labs (all labs ordered are listed, but only abnormal results are displayed) Labs Reviewed  COMPREHENSIVE METABOLIC PANEL - Abnormal; Notable for the following components:      Result Value   Glucose, Bld 113 (*)    BUN 21 (*)    All other components within normal limits  I-STAT CHEM 8, ED - Abnormal; Notable for the following components:   BUN 24 (*)    Glucose, Bld 111 (*)    Hemoglobin 15.3 (*)    All other components within normal limits  PROTIME-INR  APTT  CBC  DIFFERENTIAL  I-STAT TROPONIN, ED  CBG MONITORING, ED                                                                                                                         EKG  EKG Interpretation  Date/Time:    Ventricular Rate:    PR Interval:    QRS Duration:   QT Interval:  QTC Calculation:   R Axis:     Text Interpretation:        Radiology Ct Head Wo Contrast  Result Date: 07/07/2017 CLINICAL DATA:  Two weeks and dizziness. EXAM: CT HEAD WITHOUT CONTRAST TECHNIQUE: Contiguous axial images were obtained from the base of the skull through the vertex without intravenous contrast. COMPARISON:  MRI brain dated March 15, 2017. CTA head and neck dated Jan 01, 2017. FINDINGS: Brain: No evidence of acute infarction, hemorrhage, hydrocephalus, extra-axial collection or mass lesion/mass effect. Vascular: No hyperdense vessel or unexpected calcification. Skull: Normal. Negative for fracture or focal lesion. Sinuses/Orbits: No acute finding. Other: None. IMPRESSION: 1.  No acute intracranial abnormality. Electronically Signed   By: Titus Dubin M.D.   On: 07/07/2017 14:10   Pertinent labs & imaging results that were available  during my care of the patient were reviewed by me and considered in my medical decision making (see chart for details).  Medications Ordered in ED Medications  diazepam (VALIUM) tablet 1 mg (1 mg Oral Given 07/07/17 2042)                                                                                                                                    Procedures Procedures  (including critical care time)  Medical Decision Making / ED Course I have reviewed the nursing notes for this encounter and the patient's prior records (if available in EHR or on provided paperwork).    Workup obtained in triage revealed no evidence of electrolyte derangements.  CT head without ICH or mass-effect.  Her exam is nonfocal.  Hints exam was reassuring for peripheral etiology.  Patient was provided with a small dose of Valium which did provide improvement in her symptomatology.  I have low suspicion that this is a central process however patient does have history of prior strokes so we discussed option for MRI but with shared decision making we opted not to obtain the MRI at this time.  The patient appears reasonably screened and/or stabilized for discharge and I doubt any other medical condition or other [facility] requiring further screening, evaluation, or treatment in the ED at this time prior to discharge.  The patient is safe for discharge with strict return precautions.     Final Clinical Impression(s) / ED Diagnoses Final diagnoses:  Vertigo    Disposition: Discharge  Condition: Good  I have discussed the results, Dx and Tx plan with the patient who expressed understanding and agree(s) with the plan. Discharge instructions discussed at great length. The patient was given strict return precautions who verbalized understanding of the instructions. No further questions at time of discharge.    ED Discharge Orders        Ordered    diazepam (VALIUM) 2 MG tablet  Every 6 hours PRN     07/07/17 2151        Follow Up: Neurologist  Schedule an appointment  as soon as possible for a visit  in 5-7 days  Shon Baton, MD [place] [address] [phone]  Schedule an appointment as soon as possible for a visit  As needed     This chart was dictated using voice recognition software.  Despite best efforts to proofread,  errors can occur which can change the documentation meaning.   Fatima Blank, MD 07/07/17 [phone]

## 2017-07-07 NOTE — ED Notes (Signed)
Patient verbalizes understanding of discharge instructions. Opportunity for questioning and answers were provided. 

## 2017-07-07 NOTE — Telephone Encounter (Signed)
Error, duplicate encounter

## 2017-07-07 NOTE — Telephone Encounter (Signed)
Patient calling stating for a couple of weeks she has recurring dizziness and balance issues. Is this something she should be concerned about or does she need to be seen?

## 2017-07-07 NOTE — ED Triage Notes (Signed)
Pt to ER for evaluation of two weeks of dizziness. Pt states hx of multiple strokes last year with dizziness as a precursor. Pt states has not had any episodes in "months" but for the last two weeks as experienced it. Denies all other symptoms. NIH 0 on exam. No drift. No weakness.

## 2017-07-07 NOTE — Telephone Encounter (Signed)
Called the patient back. Advised her that per Dr. Jaynee Eagles, call 911. Need to r/o stroke. She verbalized understanding and said she was going to finish fixing her hair first.

## 2017-07-20 ENCOUNTER — Ambulatory Visit (INDEPENDENT_AMBULATORY_CARE_PROVIDER_SITE_OTHER): Payer: Medicare Other | Admitting: *Deleted

## 2017-07-20 DIAGNOSIS — I639 Cerebral infarction, unspecified: Secondary | ICD-10-CM

## 2017-07-20 NOTE — Progress Notes (Signed)
Carelink Summary Report / Loop Recorder 

## 2017-07-28 DIAGNOSIS — E7849 Other hyperlipidemia: Secondary | ICD-10-CM | POA: Diagnosis not present

## 2017-07-28 DIAGNOSIS — M859 Disorder of bone density and structure, unspecified: Secondary | ICD-10-CM | POA: Diagnosis not present

## 2017-07-30 LAB — CUP PACEART REMOTE DEVICE CHECK
Implantable Pulse Generator Implant Date: 20180927
MDC IDC SESS DTM: 20181126123837

## 2017-07-31 DIAGNOSIS — R82998 Other abnormal findings in urine: Secondary | ICD-10-CM | POA: Diagnosis not present

## 2017-08-04 DIAGNOSIS — M545 Low back pain: Secondary | ICD-10-CM | POA: Diagnosis not present

## 2017-08-04 DIAGNOSIS — H8113 Benign paroxysmal vertigo, bilateral: Secondary | ICD-10-CM | POA: Diagnosis not present

## 2017-08-04 DIAGNOSIS — I8390 Asymptomatic varicose veins of unspecified lower extremity: Secondary | ICD-10-CM | POA: Diagnosis not present

## 2017-08-04 DIAGNOSIS — I878 Other specified disorders of veins: Secondary | ICD-10-CM | POA: Diagnosis not present

## 2017-08-04 DIAGNOSIS — Z95818 Presence of other cardiac implants and grafts: Secondary | ICD-10-CM | POA: Diagnosis not present

## 2017-08-04 DIAGNOSIS — M859 Disorder of bone density and structure, unspecified: Secondary | ICD-10-CM | POA: Diagnosis not present

## 2017-08-04 DIAGNOSIS — I6990 Unspecified sequelae of unspecified cerebrovascular disease: Secondary | ICD-10-CM | POA: Diagnosis not present

## 2017-08-04 DIAGNOSIS — E7849 Other hyperlipidemia: Secondary | ICD-10-CM | POA: Diagnosis not present

## 2017-08-04 DIAGNOSIS — Z6826 Body mass index (BMI) 26.0-26.9, adult: Secondary | ICD-10-CM | POA: Diagnosis not present

## 2017-08-04 DIAGNOSIS — E559 Vitamin D deficiency, unspecified: Secondary | ICD-10-CM | POA: Diagnosis not present

## 2017-08-04 DIAGNOSIS — Z1389 Encounter for screening for other disorder: Secondary | ICD-10-CM | POA: Diagnosis not present

## 2017-08-04 DIAGNOSIS — Z0000 Encounter for general adult medical examination without abnormal findings: Secondary | ICD-10-CM | POA: Diagnosis not present

## 2017-08-19 ENCOUNTER — Ambulatory Visit (INDEPENDENT_AMBULATORY_CARE_PROVIDER_SITE_OTHER): Payer: Medicare Other | Admitting: *Deleted

## 2017-08-19 DIAGNOSIS — I639 Cerebral infarction, unspecified: Secondary | ICD-10-CM | POA: Diagnosis not present

## 2017-08-19 NOTE — Progress Notes (Signed)
Carelink Summary Report / Loop Recorder 

## 2017-08-31 LAB — CUP PACEART REMOTE DEVICE CHECK
Implantable Pulse Generator Implant Date: 20180927
MDC IDC SESS DTM: 20181226123744

## 2017-09-16 ENCOUNTER — Other Ambulatory Visit: Payer: Self-pay | Admitting: Internal Medicine

## 2017-09-18 ENCOUNTER — Ambulatory Visit (INDEPENDENT_AMBULATORY_CARE_PROVIDER_SITE_OTHER): Payer: Medicare Other | Admitting: *Deleted

## 2017-09-18 DIAGNOSIS — I639 Cerebral infarction, unspecified: Secondary | ICD-10-CM

## 2017-09-18 NOTE — Progress Notes (Signed)
Carelink Summary Report / Loop Recorder 

## 2017-09-25 LAB — CUP PACEART REMOTE DEVICE CHECK
Date Time Interrogation Session: 20190125130638
MDC IDC PG IMPLANT DT: 20180927

## 2017-10-21 ENCOUNTER — Ambulatory Visit (INDEPENDENT_AMBULATORY_CARE_PROVIDER_SITE_OTHER): Payer: Medicare Other | Admitting: *Deleted

## 2017-10-21 DIAGNOSIS — I639 Cerebral infarction, unspecified: Secondary | ICD-10-CM

## 2017-10-21 NOTE — Progress Notes (Signed)
Carelink Summary Report / Loop Recorder 

## 2017-10-23 ENCOUNTER — Other Ambulatory Visit: Payer: Self-pay | Admitting: Internal Medicine

## 2017-10-26 ENCOUNTER — Encounter: Payer: Self-pay | Admitting: Neurology

## 2017-10-26 ENCOUNTER — Ambulatory Visit (INDEPENDENT_AMBULATORY_CARE_PROVIDER_SITE_OTHER): Payer: Medicare Other | Admitting: Neurology

## 2017-10-26 VITALS — BP 111/73 | HR 72 | Ht 67.0 in | Wt 168.4 lb

## 2017-10-26 DIAGNOSIS — R9082 White matter disease, unspecified: Secondary | ICD-10-CM

## 2017-10-26 DIAGNOSIS — R2689 Other abnormalities of gait and mobility: Secondary | ICD-10-CM | POA: Diagnosis not present

## 2017-10-26 DIAGNOSIS — R42 Dizziness and giddiness: Secondary | ICD-10-CM | POA: Diagnosis not present

## 2017-10-26 DIAGNOSIS — I639 Cerebral infarction, unspecified: Secondary | ICD-10-CM | POA: Diagnosis not present

## 2017-10-26 DIAGNOSIS — R270 Ataxia, unspecified: Secondary | ICD-10-CM | POA: Diagnosis not present

## 2017-10-26 DIAGNOSIS — H814 Vertigo of central origin: Secondary | ICD-10-CM

## 2017-10-26 DIAGNOSIS — H8149 Vertigo of central origin, unspecified ear: Secondary | ICD-10-CM

## 2017-10-26 NOTE — Progress Notes (Signed)
[facility]    rovider:  Dr Jaynee Eagles Referring Provider: Shon Baton, MD Primary Care Physician:  Shon Baton, MD CC: Stroke  Interval history 10/26/2017: She has intermittent dizziness.  She went to the ED. It was late morning and waited for 8 hours. Need to repeat MRI brain. Vertigo was going on for a week, it was more dizziness, new symptoms. Given previous cryptogenic stroke and questionable white matter changes need repeat MRI brain   Interval history 04/22/2017: Patient is here with her son for the first time. Spent an extended visit reviewing patient's clinical history, reviewing images of initial MRI and most recent MRI of the brain, reviewing echocardiogram and vascular imaging, discussing next and possibility of atrial fibrillation or other etiologies of clot, discussed stroke, treatment for atrial fibrillation and answered all questions.  Interval history 03/05/2017: Patient is here for follow-up of subacute strokes that were seen on MRI of the brain. She was started on aspirin 325 as well as hemoglobin A1c, she follows with PCP for cholesterol and advised LDL goal is less than 70. CTA of the head showed no intracranial or extracranial stenosis or occlusion of significance. Echocardiogram of the heart showed no PFO or thrombus.  Cardiac telemetry did not show atrial fibrillation.  ENI:DPOEU L Dowtinis a 72 y.o.femalehere as a referral from Dr. Minta Balsam stroke. She has a history of palpitations. 10 years ago she had extensive palpitations. MRi results showed subacute strokes. She has a history of supraventricular tacycardia. No history of afib. PMHx OA, HLD, She was not on anti-platelet prior to dizziness. Dizziness since September, waxes and wanes, sometimes it is room spinning or imbalance. Currently she is fine but a week ago it was more imbalance when standing. She had a fall at [place], she was rushing and she fell and hit her head on th left side. She bit her  lip. Otherwise she can;t remember any event that may have happened in the last several months coinciding with the MRI results. She tends to walk and catches her toe and has falls. She has had vestibular therapy but never for gait abnormality or falls. No other focal neurologic deficits, associated symptoms, inciting events or modifiable factors.  Reviewed notes, labs and imaging from outside physicians, which showed:  Reviewed ENT notes. She presented with a chief complaint of dizziness and hearing loss. This started last fall when she started feeling imbalance and standing. There has been some room spinning. Onset was fairly sudden. PCP prescribed meclizine. The feeling comes and goes and goes away for a few weeks and comes back for a few weeks. Meclizine is helpful. The feeling is constant even when not moving. Head movements do not affect the feeling. She has white noise all the time that worsens. There is an associated pressure feeling in the head. When she stands the imbalance worsens. She has trouble hearing high-pitched sounds. She work with a physical therapist who did not find a problem. She has a remote history of migraines. Exam showed normal on exam, hearing test demonstrates symmetric sensorineural loss. Her imbalance symptoms not typical of an inner ear process. He recommended brain imaging.  Personally reviewed MRI images and agree with the following:  IMPRESSION: Four foci of low level restricted diffusion and contrast enhancement in the deep white matter of the left hemisphere probably consistent with late subacute small vessel infarctions. These could be embolic or watershed. No evidence of cortical infarction. The differential diagnosis does include demyelinating disease, but that is felt less  likely.  Mild chronic small-vessel ischemic change of the hemispheric white matter in addition.  LDL 96 in 2016  Review of Systems: Patient complains of symptoms per HPI as well as the  following symptoms:no CP, no SOB. Pertinent negatives and positives per HPI. All others negative.   Social History   Socioeconomic History  . Marital status: Married    Spouse name: Not on file  . Number of children: 2  . Years of education: Not on file  . Highest education level: Bachelor's degree (e.g., BA, AB, BS)  Social Needs  . Financial resource strain: Not on file  . Food insecurity - worry: Not on file  . Food insecurity - inability: Not on file  . Transportation needs - medical: Not on file  . Transportation needs - non-medical: Not on file  Occupational History  . Not on file  Tobacco Use  . Smoking status: Never Smoker  . Smokeless tobacco: Never Used  Substance and Sexual Activity  . Alcohol use: Yes    Alcohol/week: 3.0 oz    Types: 5 Glasses of wine per week  . Drug use: No  . Sexual activity: Not on file  Other Topics Concern  . Not on file  Social History Narrative   Lives at home in [place] with spouse.    Retired Careers information officer.   Active in the community.   Right handed   Drinks 1 cup of caffeine daily    Family History  Problem Relation Age of Onset  . Heart attack Mother 72  . Emphysema Father        smoker  . Colon polyps Father   . Breast cancer Sister   . Cancer Paternal Grandfather        throat cancer  . Esophageal cancer Paternal Grandfather   . Heart attack Maternal Grandfather 76  . Diabetes Neg Hx   . Stroke Neg Hx   . Colon cancer Neg Hx   . Rectal cancer Neg Hx   . Stomach cancer Neg Hx     Past Medical History:  Diagnosis Date  . Allergy   . Cancer ([place])    skin cancer - on back, forehead  . Cryptogenic stroke [facility])    followed by Dr Jaynee Eagles  . Diverticulosis of colon 2007   Dr Olevia Perches  . History of basal cell carcinoma excision    X3  . History of colon polyps    NON-CANCEROUS  . History of kidney stones   . Hyperlipidemia   . OA (osteoarthritis of spine)   . PONV (postoperative nausea and vomiting)   . PSVT  (paroxysmal supraventricular tachycardia) (HCC)    adenosine sensitive SVT previously documented  . Right ureteral stone   . Urgency of urination   . Wears glasses     Past Surgical History:  Procedure Laterality Date  . APPENDECTOMY  1950  . 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 CLOSED MANIPULATION POST TOTAL  KNEE ARTHROPLASTY  07-13-2006  . COLONOSCOPY W/ POLYPECTOMY  2002     adenomatous; Dr Olevia Perches  . EXTRACORPOREAL SHOCK WAVE LITHOTRIPSY  X3  . FACELIFT W/BLEPHAROPLASTY  2005  . HYSTEROSCOPY W/D&C  12-22-2000  . LOOP RECORDER INSERTION N/A 05/21/2017   Procedure: LOOP RECORDER INSERTION;  Surgeon: Thompson Grayer, MD;  Location: [facility];  Service: Cardiovascular;  Laterality: N/A;  . OPEN KNEE MENISECTOMY  1964  . RIGHT URETEROSCOPIC STONE  EXTRACTION / STENT PLACEMENT  08-21-2008  . rotator cuff on right shoulder    . TONSILLECTOMY  1950  . TOTAL KNEE ARTHROPLASTY Right 05-30-2006  . TUBAL LIGATION    . WISDOM TOOTH EXTRACTION      Current Outpatient Medications  Medication Sig Dispense Refill  . alendronate (FOSAMAX) 70 MG tablet Take 70 mg by mouth once a week. Wednesdays    . aspirin 325 MG tablet Take 325 mg by mouth at bedtime.    . cetirizine (ZYRTEC) 10 MG tablet Take 10 mg by mouth daily.     . Cholecalciferol (VITAMIN D) 2000 units CAPS Take 1 capsule at bedtime by mouth.     . diazepam (VALIUM) 2 MG tablet Take 0.5-1 tablets (1-2 mg total) every 6 (six) hours as needed by mouth (vertigo). 30 tablet 0  . diltiazem (CARDIZEM) 120 MG tablet TAKE 1 TABLET TWICE DAILY (Patient taking differently: Take 120 mg 2 (two) times daily by mouth. TAKE 1 TABLET TWICE DAILY) 180 tablet 0  . meclizine (ANTIVERT) 25 MG tablet Take 25 mg by mouth 3 (three) times daily as needed for dizziness.    . pravastatin (PRAVACHOL) 40 MG tablet Take 1 tablet (40 mg total) by mouth daily. (Patient taking  differently: Take 40 mg at bedtime by mouth. ) 90 tablet 0  . fluticasone (FLONASE) 50 MCG/ACT nasal spray Place 1 spray into both nostrils daily as needed for allergies.      No current facility-administered medications for this visit.     Allergies as of 10/26/2017 - Review Complete 10/26/2017  Allergen Reaction Noted  . Morphine Other (See Comments) 01/19/2007  . Penicillins Rash 01/19/2007  . Levofloxacin Other (See Comments)     Vitals: BP 111/73 (BP Location: Left Arm, Patient Position: Standing) Comment (Patient Position): @3  minutes  Pulse 72   Ht 5\' 7"  (1.702 m)   Wt 168 lb 6.4 oz (76.4 kg)   BMI 26.38 kg/m  Last Weight:  Wt Readings from Last 1 Encounters:  10/26/17 168 lb 6.4 oz (76.4 kg)   Last Height:   Ht Readings from Last 1 Encounters:  10/26/17 5\' 7"  (1.702 m)   Physical exam: Exam: Gen: NAD, conversant, well nourised, obese, well groomed                     CV: RRR, no MRG. No Carotid Bruits. No peripheral edema, warm, nontender Eyes: Conjunctivae clear without exudates or hemorrhage  Neuro: Detailed Neurologic Exam  Speech:    Speech is normal; fluent and spontaneous with normal comprehension.  Cognition:    The patient is oriented to person, place, and time;     recent and remote memory intact;     language fluent;     normal attention, concentration,     fund of knowledge Cranial Nerves:    The pupils are equal, round, and reactive to light. The fundi are normal and spontaneous venous pulsations are present. Visual fields are full to finger confrontation. Extraocular movements are intact. Trigeminal sensation is intact and the muscles of mastication are normal. The face is symmetric. The palate elevates in the midline. Hearing intact. Voice is normal. Shoulder shrug is normal. The tongue has normal motion without fasciculations.   Coordination:    Normal finger to nose and heel to shin. Normal rapid alternating movements.   Gait:    Heel-toe  and tandem gait are normal.   Motor Observation:    No asymmetry, no atrophy,  and no involuntary movements noted. Tone:    Normal muscle tone.    Posture:    Posture is normal. normal erect    Strength:    Strength is V/V in the upper and lower limbs.      Sensation: intact to LT     Reflex Exam:  DTR's:    Deep tendon reflexes in the upper and lower extremities are normal bilaterally.   Toes:    The toes are downgoing bilaterally.   Clonus:    Clonus is absent.   Assessment/Plan:72 year old with chronic dizziness and imbalance for 6-7 months. MRI showed foci of low level restricted diffusion and contrast enhancement in the deep white matter of the left hemisphere probably consistent with late subacute embolic infarctions.   Workup including CTA head and neck, echo, holter monitor unrevealing.  Patient will need loop recorder, will refer to Dr. Joylene Grapes: completed Physical therapy for gait and imbalance, falls: completed Needs vestibular therapy for persistent vertigo Repeat MRI brain w/wo contrast with stroke and MS protocol due to worsening symptoms with thin cuts through the IACs due to questionable strokes vs MS int he past, abnormal white matter disease, evaluate for other leasions such as schwannoma  Continue ASA 325mg  daily  Orders Placed This Encounter  Procedures  . MR BRAIN W WO CONTRAST  . Basic Metabolic Panel  . Ambulatory referral to Physical Therapy     Sarina Ill, MD  [facility] [address] [place], [place] [address]  Phone [phone] Fax [phone]  A total of 40 minutes was spent face-to-face with this patient. Over half this time was spent on counseling patient on the vertigo, dizziness, embolic strokes diagnosis and different diagnostic and therapeutic options available, reviewing diagnostics results, labs, instructions for management and follow-up.

## 2017-10-26 NOTE — Patient Instructions (Signed)
MRI brain w/wo contrast Lab today Vestibular therapy

## 2017-10-27 ENCOUNTER — Telehealth: Payer: Self-pay | Admitting: *Deleted

## 2017-10-27 LAB — BASIC METABOLIC PANEL
BUN/Creatinine Ratio: 18 (ref 12–28)
BUN: 18 mg/dL (ref 8–27)
CALCIUM: 9.8 mg/dL (ref 8.7–10.3)
CO2: 24 mmol/L (ref 20–29)
Chloride: 105 mmol/L (ref 96–106)
Creatinine, Ser: 0.99 mg/dL (ref 0.57–1.00)
GFR calc non Af Amer: 57 mL/min/{1.73_m2} — ABNORMAL LOW (ref >59)
GFR, EST AFRICAN AMERICAN: 65 mL/min/{1.73_m2} (ref >59)
Glucose: 87 mg/dL (ref 65–99)
Potassium: 5 mmol/L (ref 3.5–5.2)
Sodium: 143 mmol/L (ref 134–144)

## 2017-10-27 NOTE — Telephone Encounter (Signed)
-----   Message from Melvenia Beam, MD sent at 10/27/2017  9:35 AM EST ----- Labs stable thanks

## 2017-10-27 NOTE — Telephone Encounter (Signed)
Called pt & LVM (ok per DPR) informing pt that her labs are stable and that a call back is not required. However, encouraged pt to call with questions. Left office number in message.

## 2017-11-05 ENCOUNTER — Ambulatory Visit: Payer: Medicare Other | Attending: Neurology

## 2017-11-05 ENCOUNTER — Other Ambulatory Visit: Payer: Self-pay

## 2017-11-05 DIAGNOSIS — R2681 Unsteadiness on feet: Secondary | ICD-10-CM | POA: Insufficient documentation

## 2017-11-05 DIAGNOSIS — R2689 Other abnormalities of gait and mobility: Secondary | ICD-10-CM | POA: Diagnosis not present

## 2017-11-05 DIAGNOSIS — R42 Dizziness and giddiness: Secondary | ICD-10-CM | POA: Diagnosis not present

## 2017-11-05 NOTE — Therapy (Signed)
[facility] [address] [place], [place], [address] Phone: [phone]   Fax:  [phone]  Physical Therapy Evaluation  Patient Details  Name: Sarah Valdez MRN: [id number] Date of Birth: 1946/04/03 Referring Provider: Sarina Ill B,MD   Encounter Date: 11/05/2017   PT End of Session - 11/05/17 1249    Visit Number  1    Number of Visits  5    Date for PT Re-Evaluation  12/05/17    Authorization Type  Medicare and BCBS    PT Start Time  1941    PT Stop Time  1102    PT Time Calculation (min)  47 min    Equipment Utilized During Treatment  Gait belt    Activity Tolerance  Patient tolerated treatment well    Behavior During Therapy  [facility] for tasks assessed/performed         Past Medical History:  Diagnosis Date  . Allergy   . Cancer ([place])    skin cancer - on back, forehead  . Cryptogenic stroke [facility])    followed by Dr Jaynee Eagles  . Diverticulosis of colon 2007   Dr Olevia Perches  . History of basal cell carcinoma excision    X3  . History of colon polyps    NON-CANCEROUS  . History of kidney stones   . Hyperlipidemia   . OA (osteoarthritis of spine)   . PONV (postoperative nausea and vomiting)   . PSVT (paroxysmal supraventricular tachycardia) (HCC)    adenosine sensitive SVT previously documented  . Right ureteral stone   . Urgency of urination   . Wears glasses     Past Surgical History:  Procedure Laterality Date  . APPENDECTOMY  1950  . 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 CLOSED MANIPULATION POST TOTAL  KNEE ARTHROPLASTY  07-13-2006  . COLONOSCOPY W/ POLYPECTOMY  2002     adenomatous; Dr Olevia Perches  . EXTRACORPOREAL SHOCK WAVE LITHOTRIPSY  X3  . FACELIFT W/BLEPHAROPLASTY  2005  . HYSTEROSCOPY W/D&C  12-22-2000  . LOOP RECORDER INSERTION N/A 05/21/2017   Procedure: LOOP RECORDER INSERTION;  Surgeon: Thompson Grayer, MD;   Location: [facility];  Service: Cardiovascular;  Laterality: N/A;  . OPEN KNEE MENISECTOMY  1964  . RIGHT URETEROSCOPIC STONE EXTRACTION / STENT PLACEMENT  08-21-2008  . rotator cuff on right shoulder    . TONSILLECTOMY  1950  . TOTAL KNEE ARTHROPLASTY Right 05-30-2006  . TUBAL LIGATION    . WISDOM TOOTH EXTRACTION      There were no vitals filed for this visit.  Subjective Assessment - 11/05/17 1023    Subjective  Pt is a 72 y/o F who has been referred to OPPT by Sarina Ill B,MD  for Vertigo dizziness and potential Central Vestibular vertigo on 10/26/2017. pt had ED visit on 07/07/2017 for vertigo and on set with turning head and changing positions. pt also has history of migraines, as a child, and recently has been struggling with headaches that have woken her at night.  Pt reports dizzy spells are short lasting but she has noticed that when she changes position can result in slight lightheadedness. Pt has been doing exercises classes to improve balance.     Pertinent History  Hx of skin Cancer, Crytopgenic Stroke(05/052018), OA, hearing loss bilateral (per ENT)    Limitations  Standing;Walking;Writing    Patient Stated Goals  pt wants to have no more  vertigo    Currently in Pain?  No/denies         [facility] PT Assessment - 11/05/17 1025      Assessment   Medical Diagnosis  Vertigo dizziness and Central Vestibular vertigo    Referring Provider  Sarina Ill B,MD    Onset Date/Surgical Date  10/26/17  referral date    Hand Dominance  Right    Prior Therapy  Physical therapy       Precautions   Precautions  None falls will be assessed next session       Restrictions   Weight Bearing Restrictions  No      Balance Screen   Has the patient fallen in the past 6 months  No    Has the patient had a decrease in activity level because of a fear of falling?   Yes    Is the patient reluctant to leave their home because of a fear of falling?   No      Home Academic librarian  Private residence    Living Arrangements  Spouse/significant other    Available Help at Discharge  Family    Type of [place] to enter    Entrance Stairs-Number of Steps  2    Entrance Stairs-Rails  Left    [place]  Two level;Able to live on main level with bedroom/bathroom    Alternate Level Stairs-Number of Steps  14    Alternate Level Stairs-Rails  Right      Prior Function   Level of Independence  Independent      Cognition   Overall Cognitive Status  Within Functional Limits for tasks assessed      Observation/Other Assessments   Observations  left lower quarter facial drop. CN12 dysfunction( tongue deviation to Left).         Vestibular Assessment - 11/05/17 1025      Symptom Behavior   Type of Dizziness  "Funny feeling in head"    Frequency of Dizziness  Intermittent, multiple times an hour    Duration of Dizziness  secounds    Aggravating Factors  Sit to stand;Turning head sideways    Relieving Factors  Medication      Occulomotor Exam   Occulomotor Alignment  Normal    Spontaneous  Absent    Gaze-induced  Absent    Head shaking Horizontal  Absent    Head Shaking Vertical  --    Smooth Pursuits  Intact Down left, poor tracking    Saccades  Poor trajectory;Slow overshooting    Comment  L eye no convergence after 4 inches. HIT(-) bilateral       Vestibulo-Occular Reflex   VOR 1 Head Only (x 1 viewing)  Intact    VOR Cancellation  Normal      Positional Testing   Sidelying Test  Sidelying Left;Sidelying Right    Horizontal Canal Testing  Horizontal Canal Left;Horizontal Canal Right      Sidelying Right   Sidelying Right Duration  0    Sidelying Right Symptoms  No nystagmus      Sidelying Left   Sidelying Left Symptoms  No nystagmus      Horizontal Canal Right   Horizontal Canal Right Symptoms  Normal      Horizontal Canal Left   Horizontal Canal Left Symptoms  Normal      Orthostatics   BP supine (x  5 minutes)  106/77    HR supine (x 5 minutes)  53    BP sitting  108/72    HR sitting  40    BP standing (after 1 minute)  112/84    HR standing (after 1 minute)  66    Orthostatics Comment  negitive for orthostatic hypotension           Objective measurements completed on examination: See above findings.              PT Education - 11/05/17 1247    Education provided  Yes    Education Details  PT POC, objective findings, Vertigo, and that PT would monitor dizziness and refer back to MD if s/s do not improve.     Person(s) Educated  Patient    Methods  Explanation;Demonstration    Comprehension  Verbalized understanding         PT Short Term Goals - 11/05/17 1527      PT SHORT TERM GOAL #1   Title  Same as LTGs        PT Long Term Goals - 11/05/17 1644      PT LONG TERM GOAL #1   Title  Pt will be Independent with HEP(ALL TARGET GOALS 12/03/2017)    Status  New    Target Date  12/03/17      PT LONG TERM GOAL #2   Title  pt will complete SOT testing LTG written based on results    Status  New      PT LONG TERM GOAL #3   Title  Pt will complete FGA and goal will be updated to reflect outcome     Status  New      PT LONG TERM GOAL #4   Title  Pt will report methods used to reduce frequency of dizziness spells     Status  New            Plan - 11/05/17 1523    Clinical Impression Statement  Pt is a kind 72 y/o F who presents to PT with reports of intermittent dizziness and unsteadiness. At the request of her neurologist to rule out the possibility of vertigo. During PT evaluation patient reported several 2-5 second dizzy spells that come on randomly, regardless of external stimulus.  During ocular exam patient demonstrated poor tracking with lower and left smooth pursuit over projection with saccadic movements, as well as Left eye inability to converge further once object is ~4in from face. Patient also presented with slight left lower quarter facial  drop and Left tongue deviation when asked to stick out straight (CN 12 assessment + for dysfunction). During entire vestibular evaluation patient did not present with any nystagmus including during positional testing, pt reported 0/10 dizziness. Pt also negative for orthostatic hypotension but concern was raised over patient low HR is seated position (40bpm) as compared to her reported norm and other positions (~60 BPM). Objective findings have prompted PT send message to neurologist alerting them of finding of potential condition outside scope of PT. Patient has imaging tomorrow for further assess symptom presentation. PT will conduct functional gait assessment next session to further assess patient gait stability and falls risk.  Pt stands to benefit from skilled PT intervention to further assess falls risk reduction and reduce impact of dizziness on daily functional mobility.      History and Personal Factors relevant to plan of care:  Hx of skin Cancer, Cryptogenic Stroke(05/052018), OA, bilateral hearing loss per ENT,  Scoliosis    Clinical Presentation  Evolving    Clinical Presentation due to:  symptom presentation    Clinical Decision Making  Moderate    Rehab Potential  Fair    Clinical Impairments Affecting Rehab Potential  good attitude, complexity of presentation     PT Frequency  1x / week    PT Duration  4 weeks    PT Treatment/Interventions  ADLs/Self Care Home Management;Gait training;Functional mobility training;Therapeutic activities;Therapeutic exercise;Balance training;Neuromuscular re-education;Patient/family education;Vestibular    PT Next Visit Plan  Establish HEP, FGA, SOT    Consulted and Agree with Plan of Care  Patient          Patient will benefit from skilled therapeutic intervention in order to improve the following deficits and impairments:  Dizziness, Decreased activity tolerance, Impaired vision/preception  Visit Diagnosis: Dizziness and giddiness  Other  abnormalities of gait and mobility  Unsteadiness on feet     Problem List Patient Active Problem List   Diagnosis Date Noted  . Cryptogenic stroke ([place]) 12/27/2016  . Bilateral sensorineural hearing loss 11/06/2016  . Imbalance 11/06/2016  . Hepatic steatosis 01/20/2014  . Gallstones 01/20/2014  . Pancreatic cyst 01/20/2014  . Abnormal CT scan, liver 01/02/2014  . Back spasm 06/10/2013  . Osteopenia 09/20/2012  . COUGH VARIANT ASTHMA 02/11/2010  . Diverticulosis of large intestine 12/25/2009  . Wrist osteonecrosis 12/25/2009  . Vitamin D deficiency 11/28/2008  . NEPHROLITHIASIS, HX OF 11/28/2008  . Dyslipidemia 11/24/2007  . Allergic rhinitis, cause unspecified 11/24/2007  . PALPITATIONS 11/24/2007  . DEGENERATIVE DISC DISEASE 01/19/2007  . SKIN CANCER, HX OF 01/19/2007  . COLONIC POLYPS, HX OF 01/19/2007    Waunita Schooner SPT 11/05/2017, 4:57 PM  [facility] [address] [place], [place], [address] Phone: [phone]   Fax:  [phone]  Name: Sarah Valdez MRN: [id number] Date of Birth: Feb 23, 1946

## 2017-11-06 ENCOUNTER — Telehealth: Payer: Self-pay

## 2017-11-06 ENCOUNTER — Ambulatory Visit
Admission: RE | Admit: 2017-11-06 | Discharge: 2017-11-06 | Disposition: A | Discharge status: Home / Self Care | Payer: Medicare Other | Source: Ambulatory Visit | Attending: Neurology | Admitting: Neurology

## 2017-11-06 DIAGNOSIS — I639 Cerebral infarction, unspecified: Secondary | ICD-10-CM | POA: Diagnosis not present

## 2017-11-06 DIAGNOSIS — R2689 Other abnormalities of gait and mobility: Secondary | ICD-10-CM

## 2017-11-06 DIAGNOSIS — R270 Ataxia, unspecified: Secondary | ICD-10-CM

## 2017-11-06 DIAGNOSIS — R42 Dizziness and giddiness: Secondary | ICD-10-CM | POA: Diagnosis not present

## 2017-11-06 DIAGNOSIS — R9082 White matter disease, unspecified: Secondary | ICD-10-CM

## 2017-11-06 MED ORDER — GADOBENATE DIMEGLUMINE 529 MG/ML IV SOLN
15.0000 mL | Freq: Once | INTRAVENOUS | Status: AC | PRN
  Administered 2017-11-06: 15 mL via INTRAVENOUS

## 2017-11-06 NOTE — Telephone Encounter (Signed)
Dr. Jaynee Eagles and Dr. Rayann Heman  I saw Sarah Valdez yesterday for a vestibular eval. She experienced unprovoked, intermittent dizziness during session and positional testing was negative. She also experienced a HR of 40bpm (assessed manually and by BP machine) during orthostatic hypotension testing. During the exam she experienced poor tracking and tongue deviation during cranial nerve XII testing. We will formally test balance and treat motion sensitivity. However, I wanted you both to be aware of our findings. Please see my note from 11/05/17 for details.  Thank you, Sarah Valdez, PT,DPT 11/06/17 8:40 AM Phone: [phone] Fax: [phone]

## 2017-11-07 NOTE — Telephone Encounter (Signed)
That's helpful information.  Thanks I will see keep an eye on her heart rates with her loop recorder.

## 2017-11-08 NOTE — Telephone Encounter (Signed)
I spoke with Dr. Rayann Heman and he is going to reach out to patient and give her the ability to document when she feels dizzy si he can see if it correlates with her low HR. Please let patient knwo thanks

## 2017-11-09 ENCOUNTER — Telehealth: Payer: Self-pay | Admitting: *Deleted

## 2017-11-09 NOTE — Telephone Encounter (Signed)
Called pt & LVM (ok per DPR) informing her that Dr. Rayann Heman with cardiology is going to be reaching out to her to give her the ability to document when she feels dizzy to see if it correlates with her low HR. Advised pt to call with any questions.

## 2017-11-09 NOTE — Telephone Encounter (Signed)
-----   Message from Melvenia Beam, MD sent at 11/09/2017 12:52 PM EDT ----- No acute changes on MRI brain, no new strokes.

## 2017-11-09 NOTE — Telephone Encounter (Signed)
Spoke with pt and discussed MRI brain results as follows: No acute changes on MRI brain, no new strokes.  Pt verbalized understanding and appreciation and had no questions. She also acknowledged receipt of the vm this AM regarding Dr. Rayann Heman and she did not verbalize any questions.

## 2017-11-16 ENCOUNTER — Ambulatory Visit (INDEPENDENT_AMBULATORY_CARE_PROVIDER_SITE_OTHER): Payer: Medicare Other | Admitting: Internal Medicine

## 2017-11-16 ENCOUNTER — Encounter: Payer: Self-pay | Admitting: Internal Medicine

## 2017-11-16 VITALS — BP 128/78 | HR 83 | Ht 67.0 in | Wt 170.2 lb

## 2017-11-16 DIAGNOSIS — I639 Cerebral infarction, unspecified: Secondary | ICD-10-CM

## 2017-11-16 DIAGNOSIS — I5032 Chronic diastolic (congestive) heart failure: Secondary | ICD-10-CM | POA: Diagnosis not present

## 2017-11-16 DIAGNOSIS — I471 Supraventricular tachycardia: Secondary | ICD-10-CM

## 2017-11-16 MED ORDER — DILTIAZEM HCL 120 MG PO TABS: 120.0000 mg | ORAL_TABLET | Freq: Every day | ORAL | 3 refills | Status: DC

## 2017-11-16 NOTE — Progress Notes (Signed)
PCP: Shon Baton, MD   Primary EP: Dr Rayann Heman  Sarah Valdez is a 72 y.o. female who presents today for routine electrophysiology followup.  Since last being seen in our clinic, the patient reports doing very well.  She has occasional dizziness.  This is mostly related to head position or upon standing.  Recently at PT, she was noted to have heart rate 40s.  Dr Jaynee Eagles and I discussed the possibility of bradycardia as the cause of her symptoms.  She presents today for further EP follow-up.   Today, she denies symptoms of palpitations, chest pain, shortness of breath,  lower extremity edema, presyncope, or syncope.  The patient is otherwise without complaint today.   Past Medical History:  Diagnosis Date  . Allergy   . Cancer ([place])    skin cancer - on back, forehead  . Cryptogenic stroke [facility])    followed by Dr Jaynee Eagles  . Diverticulosis of colon 2007   Dr Olevia Perches  . History of basal cell carcinoma excision    X3  . History of colon polyps    NON-CANCEROUS  . History of kidney stones   . Hyperlipidemia   . OA (osteoarthritis of spine)   . PONV (postoperative nausea and vomiting)   . PSVT (paroxysmal supraventricular tachycardia) (HCC)    adenosine sensitive SVT previously documented  . Right ureteral stone   . Urgency of urination   . Wears glasses    Past Surgical History:  Procedure Laterality Date  . APPENDECTOMY  1950  . 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 CLOSED MANIPULATION POST TOTAL  KNEE ARTHROPLASTY  07-13-2006  . COLONOSCOPY W/ POLYPECTOMY  2002     adenomatous; Dr Olevia Perches  . EXTRACORPOREAL SHOCK WAVE LITHOTRIPSY  X3  . FACELIFT W/BLEPHAROPLASTY  2005  . HYSTEROSCOPY W/D&C  12-22-2000  . LOOP RECORDER INSERTION N/A 05/21/2017   Procedure: LOOP RECORDER INSERTION;  Surgeon: Thompson Grayer, MD;  Location: [place] [facility];  Service: Cardiovascular;  Laterality: N/A;  . OPEN KNEE MENISECTOMY   1964  . RIGHT URETEROSCOPIC STONE EXTRACTION / STENT PLACEMENT  08-21-2008  . rotator cuff on right shoulder    . TONSILLECTOMY  1950  . TOTAL KNEE ARTHROPLASTY Right 05-30-2006  . TUBAL LIGATION    . WISDOM TOOTH EXTRACTION      ROS- all systems are reviewed and negatives except as per HPI above  Current Outpatient Medications  Medication Sig Dispense Refill  . alendronate (FOSAMAX) 70 MG tablet Take 70 mg by mouth once a week. Wednesdays    . aspirin 325 MG tablet Take 325 mg by mouth at bedtime.    . cetirizine (ZYRTEC) 10 MG tablet Take 10 mg by mouth daily.     . Cholecalciferol (VITAMIN D) 2000 units CAPS Take 1 capsule at bedtime by mouth.     . diazepam (VALIUM) 2 MG tablet Take 0.5-1 tablets (1-2 mg total) every 6 (six) hours as needed by mouth (vertigo). 30 tablet 0  . diltiazem (CARDIZEM) 120 MG tablet Take 120 mg by mouth 2 (two) times daily.    . fluticasone (FLONASE) 50 MCG/ACT nasal spray Place 1 spray into both nostrils daily as needed for allergies.     Marland Kitchen meclizine (ANTIVERT) 25 MG tablet Take 25 mg by mouth 3 (three) times daily as needed for dizziness.    . pravastatin (PRAVACHOL) 40 MG tablet Take 40 mg by mouth daily.  No current facility-administered medications for this visit.     Physical Exam: Vitals:   11/16/17 0857  BP: 128/78  Pulse: 83  SpO2: 96%  Weight: 170 lb 3.2 oz (77.2 kg)  Height: 5\' 7"  (1.702 m)    GEN- The patient is well appearing, alert and oriented x 3 today.   Head- normocephalic, atraumatic Eyes-  Sclera clear, conjunctiva pink Ears- hearing intact Oropharynx- clear  Lungs- Clear to ausculation bilaterally, normal work of breathing Heart- Regular rate and rhythm, no murmurs, rubs or gallops, PMI not laterally displaced GI- soft, NT, ND, + BS Extremities- no clubbing, cyanosis, or edema  EKG tracing ordered today is personally reviewed and shows sinus rhythm  Assessment and Plan:  1. Dizziness Unclear etiology Change in  head position or upon standing are atypical for bradycardia as the cause Histograms reveals relatively normal distribution of heart rates.  Rare low heart rates are noted. Loletha Grayer detections are turned on for 30 bpm for 12 seconds.  Pause detection is turned on today at 4.5 seconds. Pt is also given a symptomatic activator.  She will return in 6 weeks to see if there is any correlation of heart rate to episodes Reduce diltiazem to 120mg  daily  2. SVT Adenosine sensitive Well controlled Diltiazem reduced as above  3. Chronic diastolic dysfunction Stable No change required today  4. Cryptogenic stroke No afib detected on ILR (AF episodes are sinus with PACs)  Return to see EP NP in 6 weeks  Thompson Grayer MD, [facility] 11/16/2017 9:06 AM

## 2017-11-16 NOTE — Patient Instructions (Addendum)
Medication Instructions:  Your physician has recommended you make the following change in your medication:  1.  Reduce your diltiazem to 120 mg one tablet by mouth DAILY.  Labwork: None ordered.  Testing/Procedures: None ordered.  Follow-Up: Your physician wants you to follow-up in: 6 weeks with Chanetta Marshall, NP  Any Other Special Instructions Will Be Listed Below (If Applicable).  If you need a refill on your cardiac medications before your next appointment, please call your pharmacy.

## 2017-11-19 ENCOUNTER — Ambulatory Visit: Payer: Medicare Other

## 2017-11-19 DIAGNOSIS — R42 Dizziness and giddiness: Secondary | ICD-10-CM | POA: Diagnosis not present

## 2017-11-19 DIAGNOSIS — R2689 Other abnormalities of gait and mobility: Secondary | ICD-10-CM | POA: Diagnosis not present

## 2017-11-19 DIAGNOSIS — R2681 Unsteadiness on feet: Secondary | ICD-10-CM | POA: Diagnosis not present

## 2017-11-19 NOTE — Therapy (Signed)
[facility] [address] [place], [place], [address] Phone: [phone]   Fax:  [phone]  Physical Therapy Treatment  Patient Details  Name: Sarah Valdez MRN: [id number] Date of Birth: 1946/08/18 Referring Provider: Sarina Ill B,MD   Encounter Date: 11/19/2017  PT End of Session - 11/19/17 0925    Visit Number  2    Number of Visits  5    Date for PT Re-Evaluation  12/05/17    Authorization Type  Medicare and BCBS    PT Start Time  [phone]    PT Stop Time  0923 d/c    PT Time Calculation (min)  34 min    Equipment Utilized During Treatment  -- min guard and SOT harness    Activity Tolerance  Patient tolerated treatment well    Behavior During Therapy  [facility] for tasks assessed/performed       Past Medical History:  Diagnosis Date  . Allergy   . Cancer ([place])    skin cancer - on back, forehead  . Cryptogenic stroke [facility])    followed by Dr Jaynee Eagles  . Diverticulosis of colon 2007   Dr Olevia Perches  . History of basal cell carcinoma excision    X3  . History of colon polyps    NON-CANCEROUS  . History of kidney stones   . Hyperlipidemia   . OA (osteoarthritis of spine)   . PONV (postoperative nausea and vomiting)   . PSVT (paroxysmal supraventricular tachycardia) (HCC)    adenosine sensitive SVT previously documented  . Right ureteral stone   . Urgency of urination   . Wears glasses     Past Surgical History:  Procedure Laterality Date  . APPENDECTOMY  1950  . 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 CLOSED MANIPULATION POST TOTAL  KNEE ARTHROPLASTY  07-13-2006  . COLONOSCOPY W/ POLYPECTOMY  2002     adenomatous; Dr Olevia Perches  . EXTRACORPOREAL SHOCK WAVE LITHOTRIPSY  X3  . FACELIFT W/BLEPHAROPLASTY  2005  . HYSTEROSCOPY W/D&C  12-22-2000  . LOOP RECORDER INSERTION N/A 05/21/2017   Procedure: LOOP RECORDER INSERTION;  Surgeon: Thompson Grayer, MD;  Location: [place] [facility];  Service: Cardiovascular;  Laterality: N/A;  . OPEN KNEE MENISECTOMY  1964  . RIGHT URETEROSCOPIC STONE EXTRACTION / STENT PLACEMENT  08-21-2008  . rotator cuff on right shoulder    . TONSILLECTOMY  1950  . TOTAL KNEE ARTHROPLASTY Right 05-30-2006  . TUBAL LIGATION    . WISDOM TOOTH EXTRACTION      There were no vitals filed for this visit.  Subjective Assessment - 11/19/17 0852    Subjective  Pt reported she saw her MD regarding her low heart rate and is supposed to hit a button when she feels dizzy to record HR.     Pertinent History  Hx of skin Cancer, Crytopgenic Stroke(05/052018), OA, hearing loss bilateral (per ENT)    Patient Stated Goals  pt wants to have no more vertigo    Currently in Pain?  No/denies         [facility] PT Assessment - 11/19/17 0854      Functional Gait  Assessment   Gait assessed   Yes    Gait Level Surface  Walks 20 ft in less than 5.5 sec, no assistive devices, good speed, no evidence for imbalance, normal gait pattern, deviates no more than 6 in outside of the 12  in walkway width. 4.51 sec.     Change in Gait Speed  Able to smoothly change walking speed without loss of balance or gait deviation. Deviate no more than 6 in outside of the 12 in walkway width.    Gait with Horizontal Head Turns  Performs head turns smoothly with no change in gait. Deviates no more than 6 in outside 12 in walkway width    Gait with Vertical Head Turns  Performs head turns with no change in gait. Deviates no more than 6 in outside 12 in walkway width.    Gait and Pivot Turn  Pivot turns safely within 3 sec and stops quickly with no loss of balance.    Step Over Obstacle  Is able to step over 2 stacked shoe boxes taped together (9 in total height) without changing gait speed. No evidence of imbalance.    Gait with Narrow Base of Support  Is able to ambulate for 10 steps heel to toe with no staggering.    Gait with Eyes Closed  Walks 20 ft,  uses assistive device, slower speed, mild gait deviations, deviates 6-10 in outside 12 in walkway width. Ambulates 20 ft in less than 9 sec but greater than 7 sec. 6.11 sec., deviated approx. 8" outside walkway    Ambulating Backwards  Walks 20 ft, no assistive devices, good speed, no evidence for imbalance, normal gait    Steps  Alternating feet, no rail.    Total Score  29    FGA comment:  29/30: indicates WNL            No data recorded     Neuro re-ed: Neuro re-ed: sensory organization test performed with following results: Conditions: 1: WNL 2: WNL 3: WNL  4: WNL 5: 1 trial slightly below normal, 2 trials WNL 6: WNL Composite score: 73 (WNL) Sensory Analysis Som: WNL Vis: WNL Vest: WNL Pref: WNL Strategy analysis: good use of hip/ankle strategy, with slight decr. In both strategies during conditions 5 and 6.        COG alignment: Anterior (L lateral) bias.                  PT Education - 11/19/17 0924    Education provided  Yes    Education Details  PT discussed outcome measures were WNL for her age. PT encouraged pt to continue balance classes to maintain strength and balance. PT is discharging pt from PT, as it is not indicated at this time, as we could not reproduce pt's s/s. Pt's dizziness appears to be more related to bradycardia per MD.     Terence Lux) Educated  Patient    Methods  Explanation    Comprehension  Verbalized understanding       PT Short Term Goals - 11/05/17 1527      PT SHORT TERM GOAL #1   Title  Same as LTGs        PT Long Term Goals - 11/19/17 0926      PT LONG TERM GOAL #1   Title  Pt will be Independent with HEP(ALL TARGET GOALS 12/03/2017)    Status  Deferred      PT LONG TERM GOAL #2   Title  pt will complete SOT testing LTG written based on results    Status  Achieved      PT LONG TERM GOAL #3   Title  Pt will complete FGA and goal will be updated to reflect outcome  Status  Achieved      PT LONG TERM GOAL  #4   Title  Pt will report methods used to reduce frequency of dizziness spells     Status  Deferred            Plan - 11/19/17 0786    Clinical Impression Statement  Pt discharged from PT at this time 2/2 FGA and SOT WNL and PT unable to reproduce pt's dizziness. PT is discharging pt from PT, as it is not indicated at this time, as we could not reproduce pt's s/s. Pt's dizziness appears to be more related to bradycardia per MD. Please see pt d/c summary for details.     Rehab Potential  Fair    Clinical Impairments Affecting Rehab Potential  good attitude, complexity of presentation     PT Frequency  1x / week    PT Duration  4 weeks    PT Treatment/Interventions  ADLs/Self Care Home Management;Gait training;Functional mobility training;Therapeutic activities;Therapeutic exercise;Balance training;Neuromuscular re-education;Patient/family education;Vestibular    PT Next Visit Plan  d/c    Consulted and Agree with Plan of Care  Patient       Patient will benefit from skilled therapeutic intervention in order to improve the following deficits and impairments:  Dizziness, Decreased activity tolerance, Impaired vision/preception  Visit Diagnosis: Dizziness and giddiness  Other abnormalities of gait and mobility     Problem List Patient Active Problem List   Diagnosis Date Noted  . Cryptogenic stroke ([place]) 12/27/2016  . Bilateral sensorineural hearing loss 11/06/2016  . Imbalance 11/06/2016  . Hepatic steatosis 01/20/2014  . Gallstones 01/20/2014  . Pancreatic cyst 01/20/2014  . Abnormal CT scan, liver 01/02/2014  . Back spasm 06/10/2013  . Osteopenia 09/20/2012  . COUGH VARIANT ASTHMA 02/11/2010  . Diverticulosis of large intestine 12/25/2009  . Wrist osteonecrosis 12/25/2009  . Vitamin D deficiency 11/28/2008  . NEPHROLITHIASIS, HX OF 11/28/2008  . Dyslipidemia 11/24/2007  . Allergic rhinitis, cause unspecified 11/24/2007  . PALPITATIONS 11/24/2007  . DEGENERATIVE  DISC DISEASE 01/19/2007  . SKIN CANCER, HX OF 01/19/2007  . COLONIC POLYPS, HX OF 01/19/2007    Annalei Friesz L 11/19/2017, 9:27 AM  [facility] [address] [place], [place], [address] Phone: [phone]   Fax:  [phone]  Name: Sarah Valdez MRN: [id number] Date of Birth: 10/07/1945  PHYSICAL THERAPY DISCHARGE SUMMARY  Visits from Start of Care: 2  Current functional level related to goals / functional outcomes: PT Long Term Goals - 11/19/17 0926      PT LONG TERM GOAL #1   Title  Pt will be Independent with HEP(ALL TARGET GOALS 12/03/2017)    Status  Deferred      PT LONG TERM GOAL #2   Title  pt will complete SOT testing LTG written based on results    Status  Achieved      PT LONG TERM GOAL #3   Title  Pt will complete FGA and goal will be updated to reflect outcome     Status  Achieved      PT LONG TERM GOAL #4   Title  Pt will report methods used to reduce frequency of dizziness spells     Status  Deferred         Remaining deficits: PT is discharging pt from PT, as it is not indicated at this time, as we could not reproduce pt's s/s. Pt's dizziness appears to be more related to bradycardia per  MD.    Education / Equipment: Educated pt on pt continuing balance classes to maintain gains.   Plan: Patient agrees to discharge.  Patient goals were partially met. Patient is being discharged due to meeting the stated rehab goals.  ?????    PT not indicated at this time.     Geoffry Paradise, PT,DPT 11/19/17 9:30 AM Phone: [phone] Fax: [phone]

## 2017-11-23 ENCOUNTER — Ambulatory Visit (INDEPENDENT_AMBULATORY_CARE_PROVIDER_SITE_OTHER): Payer: Medicare Other | Admitting: *Deleted

## 2017-11-23 DIAGNOSIS — I639 Cerebral infarction, unspecified: Secondary | ICD-10-CM | POA: Diagnosis not present

## 2017-11-24 NOTE — Progress Notes (Signed)
Carelink Summary Reprot / Loop Recorder 

## 2017-11-25 LAB — CUP PACEART REMOTE DEVICE CHECK
Implantable Pulse Generator Implant Date: 20180927
MDC IDC SESS DTM: 20190227151338

## 2017-12-04 ENCOUNTER — Encounter: Payer: Self-pay | Admitting: Neurology

## 2017-12-07 ENCOUNTER — Other Ambulatory Visit: Payer: Self-pay | Admitting: Neurology

## 2017-12-07 MED ORDER — DIAZEPAM 2 MG PO TABS: 1.0000 mg | ORAL_TABLET | Freq: Four times a day (QID) | ORAL | 3 refills | Status: DC | PRN

## 2017-12-08 NOTE — Telephone Encounter (Signed)
Registry checked. Faxed the prescription to pharmacy. Received a receipt of confirmation.

## 2017-12-28 ENCOUNTER — Ambulatory Visit (INDEPENDENT_AMBULATORY_CARE_PROVIDER_SITE_OTHER): Payer: Medicare Other | Admitting: *Deleted

## 2017-12-28 ENCOUNTER — Telehealth: Payer: Self-pay | Admitting: *Deleted

## 2017-12-28 DIAGNOSIS — I639 Cerebral infarction, unspecified: Secondary | ICD-10-CM

## 2017-12-28 LAB — CUP PACEART REMOTE DEVICE CHECK
Implantable Pulse Generator Implant Date: 20180927
MDC IDC SESS DTM: 20190401154041

## 2017-12-28 NOTE — Progress Notes (Signed)
Carelink Summary Report / Loop Recorder 

## 2017-12-28 NOTE — Telephone Encounter (Signed)
LMOVM (DPR) requesting manual Carelink transmission for review of any symptom episode ECGs that haven't transmitted automatically.  Gave instructions and Device Clinic phone number for questions/concerns.

## 2017-12-29 DIAGNOSIS — H04122 Dry eye syndrome of left lacrimal gland: Secondary | ICD-10-CM | POA: Diagnosis not present

## 2017-12-29 DIAGNOSIS — H524 Presbyopia: Secondary | ICD-10-CM | POA: Diagnosis not present

## 2017-12-29 DIAGNOSIS — H25813 Combined forms of age-related cataract, bilateral: Secondary | ICD-10-CM | POA: Diagnosis not present

## 2017-12-29 DIAGNOSIS — H5203 Hypermetropia, bilateral: Secondary | ICD-10-CM | POA: Diagnosis not present

## 2017-12-29 DIAGNOSIS — H43813 Vitreous degeneration, bilateral: Secondary | ICD-10-CM | POA: Diagnosis not present

## 2017-12-29 DIAGNOSIS — H52223 Regular astigmatism, bilateral: Secondary | ICD-10-CM | POA: Diagnosis not present

## 2017-12-29 DIAGNOSIS — H353122 Nonexudative age-related macular degeneration, left eye, intermediate dry stage: Secondary | ICD-10-CM | POA: Diagnosis not present

## 2017-12-29 NOTE — Telephone Encounter (Signed)
Manual transmission received.  All available "symptom" ECGs appear normal sinus rhythm.

## 2018-01-19 LAB — CUP PACEART REMOTE DEVICE CHECK
MDC IDC PG IMPLANT DT: 20180927
MDC IDC SESS DTM: 20190504160600

## 2018-01-19 NOTE — Progress Notes (Signed)
Electrophysiology Office Note Date: 01/21/2018  ID:  Sarah Valdez, DOB [birth year], MRN [id number]  PCP: Shon Baton, MD Electrophysiologist: Rayann Heman  CC: dizziness follow up  Sarah Valdez is a 72 y.o. female seen today for Dr Rayann Heman.  At last office visit, bradycardia was noted on ILR. She had also been complaining of some dizziness.  She was given a symptom activator with planned follow up today to try to correlate bradycardia with symptoms.  Since last being seen in our clinic, the patient reports doing reasonably well.  She continues with intermittent dizzy spells.  These come and go. Symptom episodes during spells correlate with SR.  She denies chest pain, palpitations, dyspnea, PND, orthopnea, nausea, vomiting, syncope, edema, weight gain, or early satiety.  Past Medical History:  Diagnosis Date  . Allergy   . Cancer ([place])    skin cancer - on back, forehead  . Cryptogenic stroke [facility])    followed by Dr Jaynee Eagles  . Diverticulosis of colon 2007   Dr Olevia Perches  . History of basal cell carcinoma excision    X3  . History of colon polyps    NON-CANCEROUS  . History of kidney stones   . Hyperlipidemia   . OA (osteoarthritis of spine)   . PONV (postoperative nausea and vomiting)   . PSVT (paroxysmal supraventricular tachycardia) (HCC)    adenosine sensitive SVT previously documented  . Right ureteral stone   . Urgency of urination   . Wears glasses    Past Surgical History:  Procedure Laterality Date  . APPENDECTOMY  1950  . 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 CLOSED MANIPULATION POST TOTAL  KNEE ARTHROPLASTY  07-13-2006  . COLONOSCOPY W/ POLYPECTOMY  2002     adenomatous; Dr Olevia Perches  . EXTRACORPOREAL SHOCK WAVE LITHOTRIPSY  X3  . FACELIFT W/BLEPHAROPLASTY  2005  . HYSTEROSCOPY W/D&C  12-22-2000  . LOOP RECORDER INSERTION N/A 05/21/2017   Procedure: LOOP RECORDER INSERTION;  Surgeon: Thompson Grayer, MD;  Location: [facility];  Service: Cardiovascular;  Laterality: N/A;  . OPEN KNEE MENISECTOMY  1964  . RIGHT URETEROSCOPIC STONE EXTRACTION / STENT PLACEMENT  08-21-2008  . rotator cuff on right shoulder    . TONSILLECTOMY  1950  . TOTAL KNEE ARTHROPLASTY Right 05-30-2006  . TUBAL LIGATION    . WISDOM TOOTH EXTRACTION      Current Outpatient Medications  Medication Sig Dispense Refill  . alendronate (FOSAMAX) 70 MG tablet Take 70 mg by mouth once a week. Wednesdays    . aspirin 325 MG tablet Take 325 mg by mouth at bedtime.    . cetirizine (ZYRTEC) 10 MG tablet Take 10 mg by mouth daily.     . Cholecalciferol (VITAMIN D) 2000 units CAPS Take 1 capsule at bedtime by mouth.     . diazepam (VALIUM) 2 MG tablet Take 0.5-1 tablets (1-2 mg total) by mouth every 6 (six) hours as needed (vertigo). 15 tablet 3  . diltiazem (CARDIZEM) 120 MG tablet Take 1 tablet (120 mg total) by mouth daily. 90 tablet 3  . fluticasone (FLONASE) 50 MCG/ACT nasal spray Place 1 spray into both nostrils daily as needed for allergies.     Marland Kitchen meclizine (ANTIVERT) 25 MG tablet Take 25 mg by mouth 3 (three) times daily as needed for dizziness.    . pravastatin (PRAVACHOL) 40 MG tablet Take 40 mg by mouth daily.  No current facility-administered medications for this visit.     Allergies:   Morphine; Penicillins; and Levofloxacin   Social History: Social History   Socioeconomic History  . Marital status: Married    Spouse name: Not on file  . Number of children: 2  . Years of education: Not on file  . Highest education level: Bachelor's degree (e.g., BA, AB, BS)  Occupational History  . Not on file  Social Needs  . Financial resource strain: Not on file  . Food insecurity:    Worry: Not on file    Inability: Not on file  . Transportation needs:    Medical: Not on file    Non-medical: Not on file  Tobacco Use  . Smoking status: Never Smoker  . Smokeless tobacco: Never Used  Substance  and Sexual Activity  . Alcohol use: Yes    Alcohol/week: 3.0 oz    Types: 5 Glasses of wine per week  . Drug use: No  . Sexual activity: Not on file  Lifestyle  . Physical activity:    Days per week: Not on file    Minutes per session: Not on file  . Stress: Not on file  Relationships  . Social connections:    Talks on phone: Not on file    Gets together: Not on file    Attends religious service: Not on file    Active member of club or organization: Not on file    Attends meetings of clubs or organizations: Not on file    Relationship status: Not on file  . Intimate partner violence:    Fear of current or ex partner: Not on file    Emotionally abused: Not on file    Physically abused: Not on file    Forced sexual activity: Not on file  Other Topics Concern  . Not on file  Social History Narrative   Lives at home in [place] with spouse.    Retired Careers information officer.   Active in the community.   Right handed   Drinks 1 cup of caffeine daily    Family History: Family History  Problem Relation Age of Onset  . Heart attack Mother 45  . Emphysema Father        smoker  . Colon polyps Father   . Breast cancer Sister   . Cancer Paternal Grandfather        throat cancer  . Esophageal cancer Paternal Grandfather   . Heart attack Maternal Grandfather 76  . Diabetes Neg Hx   . Stroke Neg Hx   . Colon cancer Neg Hx   . Rectal cancer Neg Hx   . Stomach cancer Neg Hx     Review of Systems: All other systems reviewed and are otherwise negative except as noted above.   Physical Exam: VS:  BP 118/70   Pulse 72   Ht 5\' 7"  (1.702 m)   Wt 171 lb 9.6 oz (77.8 kg)   SpO2 97%   BMI 26.88 kg/m  , BMI Body mass index is 26.88 kg/m. Wt Readings from Last 3 Encounters:  01/21/18 171 lb 9.6 oz (77.8 kg)  11/16/17 170 lb 3.2 oz (77.2 kg)  10/26/17 168 lb 6.4 oz (76.4 kg)    GEN- The patient is well appearing, alert and oriented x 3 today.   HEENT: normocephalic, atraumatic;  sclera clear, conjunctiva pink; hearing intact; oropharynx clear; neck supple  Lungs- Clear to ausculation bilaterally, normal work of breathing.  No wheezes, rales,  rhonchi Heart- Regular rate and rhythm  GI- soft, non-tender, non-distended, bowel sounds present  Extremities- no clubbing, cyanosis, or edema  MS- no significant deformity or atrophy Skin- warm and dry, no rash or lesion  Psych- euthymic mood, full affect Neuro- strength and sensation are intact   EKG:  EKG is not ordered today.  Recent Labs: 07/07/2017: ALT 16; Hemoglobin 15.3; Platelets 205 10/26/2017: BUN 18; Creatinine, Ser 0.99; Potassium 5.0; Sodium 143    Other studies Reviewed: Additional studies/ records that were reviewed today include: Dr Jackalyn Lombard office notes  Assessment and Plan:  1.  Cryptogenic stroke No AF to date Continue to monitor through ILR  2.  Dizziness ILR symptom activated episodes are SR  No significant brady or pause episodes noted Will send copy of note to Dr Jaynee Eagles as well  3.  SVT No recent recurrence Continue Diltiazem (dose reduced 2/2 bradycardia seen on ILR)   Current medicines are reviewed at length with the patient today.   The patient does not have concerns regarding her medicines.  The following changes were made today:  none  Labs/ tests ordered today include: none No orders of the defined types were placed in this encounter.    Disposition:   Follow up with Carelink, me in 6 months    Signed, Chanetta Marshall, NP 01/21/2018 9:35 AM   [place] [address] [phone] (office) [phone] (fax)

## 2018-01-21 ENCOUNTER — Ambulatory Visit (INDEPENDENT_AMBULATORY_CARE_PROVIDER_SITE_OTHER): Payer: Medicare Other | Admitting: Nurse Practitioner

## 2018-01-21 ENCOUNTER — Encounter: Payer: Self-pay | Admitting: Nurse Practitioner

## 2018-01-21 VITALS — BP 118/70 | HR 72 | Ht 67.0 in | Wt 171.6 lb

## 2018-01-21 DIAGNOSIS — I471 Supraventricular tachycardia: Secondary | ICD-10-CM

## 2018-01-21 DIAGNOSIS — R42 Dizziness and giddiness: Secondary | ICD-10-CM | POA: Diagnosis not present

## 2018-01-21 DIAGNOSIS — I639 Cerebral infarction, unspecified: Secondary | ICD-10-CM

## 2018-01-21 LAB — CUP PACEART INCLINIC DEVICE CHECK
Implantable Pulse Generator Implant Date: 20180927
MDC IDC SESS DTM: 20190530103902

## 2018-01-21 NOTE — Patient Instructions (Addendum)
Medication Instructions:   Your physician recommends that you continue on your current medications as directed. Please refer to the Current Medication list given to you today.   If you need a refill on your cardiac medications before your next appointment, please call your pharmacy.  Labwork: NONE ORDERED  TODAY    Testing/Procedures: NONE ORDERED  TODAY    Follow-Up: Your physician wants you to follow-up in:  [place] will receive a reminder letter in the mail two months in advance. If you don't receive a letter, please call our office to schedule the follow-up appointment.    Remote monitoring is used to monitor your Pacemaker of ICD from home. This monitoring reduces the number of office visits required to check your device to one time per year. It allows [place] to keep an eye on the functioning of your device to ensure it is working properly. You are scheduled for a device check from home on . 01-28-18   You may send your transmission at any time that day. If you have a wireless device, the transmission will be sent automatically. After your physician reviews your transmission, you will receive a postcard with your next transmission date.     Any Other Special Instructions Will Be Listed Below (If Applicable).

## 2018-01-28 ENCOUNTER — Ambulatory Visit (INDEPENDENT_AMBULATORY_CARE_PROVIDER_SITE_OTHER): Payer: Medicare Other | Admitting: *Deleted

## 2018-01-28 DIAGNOSIS — I639 Cerebral infarction, unspecified: Secondary | ICD-10-CM | POA: Diagnosis not present

## 2018-01-29 NOTE — Progress Notes (Signed)
Carelink Summary Report / Loop Recorder 

## 2018-02-22 DIAGNOSIS — M79605 Pain in left leg: Secondary | ICD-10-CM | POA: Diagnosis not present

## 2018-02-22 DIAGNOSIS — I509 Heart failure, unspecified: Secondary | ICD-10-CM | POA: Insufficient documentation

## 2018-02-22 DIAGNOSIS — M79604 Pain in right leg: Secondary | ICD-10-CM | POA: Diagnosis not present

## 2018-02-22 DIAGNOSIS — M5136 Other intervertebral disc degeneration, lumbar region: Secondary | ICD-10-CM | POA: Diagnosis not present

## 2018-02-22 HISTORY — DX: Heart failure, unspecified: I50.9

## 2018-02-23 ENCOUNTER — Other Ambulatory Visit (HOSPITAL_COMMUNITY): Payer: Self-pay | Admitting: Surgical

## 2018-02-23 DIAGNOSIS — M5136 Other intervertebral disc degeneration, lumbar region: Secondary | ICD-10-CM

## 2018-03-02 ENCOUNTER — Ambulatory Visit (INDEPENDENT_AMBULATORY_CARE_PROVIDER_SITE_OTHER): Payer: Medicare Other | Admitting: *Deleted

## 2018-03-02 DIAGNOSIS — I639 Cerebral infarction, unspecified: Secondary | ICD-10-CM

## 2018-03-02 NOTE — Progress Notes (Signed)
Carelink Summary Report / Loop Recorder 

## 2018-03-08 ENCOUNTER — Ambulatory Visit (HOSPITAL_COMMUNITY)
Admission: RE | Admit: 2018-03-08 | Discharge: 2018-03-08 | Disposition: A | Discharge status: Home / Self Care | Payer: Medicare Other | Source: Ambulatory Visit | Attending: Surgical | Admitting: Surgical

## 2018-03-08 DIAGNOSIS — M4687 Other specified inflammatory spondylopathies, lumbosacral region: Secondary | ICD-10-CM | POA: Insufficient documentation

## 2018-03-08 DIAGNOSIS — M4686 Other specified inflammatory spondylopathies, lumbar region: Secondary | ICD-10-CM | POA: Insufficient documentation

## 2018-03-08 DIAGNOSIS — M4807 Spinal stenosis, lumbosacral region: Secondary | ICD-10-CM | POA: Insufficient documentation

## 2018-03-08 DIAGNOSIS — M5136 Other intervertebral disc degeneration, lumbar region: Secondary | ICD-10-CM | POA: Insufficient documentation

## 2018-03-08 DIAGNOSIS — M48061 Spinal stenosis, lumbar region without neurogenic claudication: Secondary | ICD-10-CM | POA: Diagnosis not present

## 2018-03-08 DIAGNOSIS — M5126 Other intervertebral disc displacement, lumbar region: Secondary | ICD-10-CM | POA: Diagnosis not present

## 2018-03-08 LAB — CUP PACEART REMOTE DEVICE CHECK
Date Time Interrogation Session: 20190606163915
MDC IDC PG IMPLANT DT: 20180927

## 2018-03-15 ENCOUNTER — Encounter: Payer: Self-pay | Admitting: Internal Medicine

## 2018-03-15 ENCOUNTER — Encounter: Payer: Self-pay | Admitting: Neurology

## 2018-03-26 DIAGNOSIS — M5126 Other intervertebral disc displacement, lumbar region: Secondary | ICD-10-CM | POA: Diagnosis not present

## 2018-03-26 DIAGNOSIS — M5137 Other intervertebral disc degeneration, lumbosacral region: Secondary | ICD-10-CM | POA: Diagnosis not present

## 2018-04-05 ENCOUNTER — Ambulatory Visit (INDEPENDENT_AMBULATORY_CARE_PROVIDER_SITE_OTHER): Payer: Medicare Other | Admitting: *Deleted

## 2018-04-05 DIAGNOSIS — I639 Cerebral infarction, unspecified: Secondary | ICD-10-CM

## 2018-04-05 NOTE — Progress Notes (Signed)
Carelink Summary Report / Loop Recorder 

## 2018-04-08 LAB — CUP PACEART REMOTE DEVICE CHECK
Date Time Interrogation Session: 20190709173754
Implantable Pulse Generator Implant Date: 20180927

## 2018-04-29 ENCOUNTER — Ambulatory Visit (INDEPENDENT_AMBULATORY_CARE_PROVIDER_SITE_OTHER): Payer: Medicare Other | Admitting: Neurology

## 2018-04-29 ENCOUNTER — Other Ambulatory Visit: Payer: Self-pay | Admitting: *Deleted

## 2018-04-29 DIAGNOSIS — I639 Cerebral infarction, unspecified: Secondary | ICD-10-CM

## 2018-04-29 MED ORDER — DIAZEPAM 2 MG PO TABS: 1.0000 mg | ORAL_TABLET | Freq: Four times a day (QID) | ORAL | 3 refills | Status: DC | PRN

## 2018-04-29 NOTE — Telephone Encounter (Addendum)
Pt reports she is doing fine, occasional dizziness as present before. She denied any concerns. Pt will plan to f/u early next year and will cancel appt today. Pt verbalized appreciation and r/s to Jan 2020. Will refill pt's valium per MD. Registry checked. Called pt and LVM asking for call back to let us know which pharmacy he would like to use for the Valium prescription and also let her know that Dr. Jaynee Eagles said she could f/u with the NP at her next visit. Left office number in message.

## 2018-04-30 NOTE — Progress Notes (Signed)
Patient left, will rechedule

## 2018-05-07 ENCOUNTER — Ambulatory Visit (INDEPENDENT_AMBULATORY_CARE_PROVIDER_SITE_OTHER): Payer: Medicare Other | Admitting: *Deleted

## 2018-05-07 DIAGNOSIS — I639 Cerebral infarction, unspecified: Secondary | ICD-10-CM | POA: Diagnosis not present

## 2018-05-08 NOTE — Progress Notes (Signed)
Carelink Summary Report / Loop Recorder 

## 2018-05-12 LAB — CUP PACEART REMOTE DEVICE CHECK
Implantable Pulse Generator Implant Date: 20180927
MDC IDC SESS DTM: 20190811173803

## 2018-05-20 DIAGNOSIS — Z6826 Body mass index (BMI) 26.0-26.9, adult: Secondary | ICD-10-CM | POA: Diagnosis not present

## 2018-05-20 DIAGNOSIS — Z124 Encounter for screening for malignant neoplasm of cervix: Secondary | ICD-10-CM | POA: Diagnosis not present

## 2018-05-22 LAB — CUP PACEART REMOTE DEVICE CHECK
Date Time Interrogation Session: 20190913181002
Implantable Pulse Generator Implant Date: 20180927

## 2018-06-03 DIAGNOSIS — Z23 Encounter for immunization: Secondary | ICD-10-CM | POA: Diagnosis not present

## 2018-06-09 ENCOUNTER — Ambulatory Visit (INDEPENDENT_AMBULATORY_CARE_PROVIDER_SITE_OTHER): Payer: Medicare Other | Admitting: *Deleted

## 2018-06-09 DIAGNOSIS — I639 Cerebral infarction, unspecified: Secondary | ICD-10-CM

## 2018-06-10 NOTE — Progress Notes (Signed)
Carelink Summary Report / Loop Recorder 

## 2018-06-21 LAB — CUP PACEART REMOTE DEVICE CHECK
Implantable Pulse Generator Implant Date: 20180927
MDC IDC SESS DTM: 20191016183644

## 2018-06-28 ENCOUNTER — Telehealth: Payer: Self-pay | Admitting: *Deleted

## 2018-06-28 ENCOUNTER — Telehealth: Payer: Self-pay | Admitting: Cardiology

## 2018-06-28 NOTE — Telephone Encounter (Signed)
Manual transmission received and reviewed. 3 tachy episodes are from 11/3 between 1557-1620, median V rates 182-188bpm, ECGs all appear SVT. Patient denies presyncope/syncope during this period, but states she was aware of palpitations. She reports that the episode felt short and that she remembered she forgot to take her diltiazem on Saturday night. She is aware that Dr. Rayann Heman will review episodes and we will call back if any recommended changes.  Symptom episode from today (11/4) is accidental--patient thought that was how she needed to transmit. Discussed symptom activator use and requested that patient call to make [place] aware of her symptoms if she uses it in the future. Patient verbalizes understanding.

## 2018-06-28 NOTE — Telephone Encounter (Signed)
Spoke w/ pt and informed her how to send a manual transmission w/ her home monitor. Pt verbalized understanding.

## 2018-06-28 NOTE — Telephone Encounter (Signed)
See other phone note from 06/28/18.

## 2018-06-28 NOTE — Telephone Encounter (Signed)
LMOVM (DPR) requesting manual transmission for review of tachy episodes and call back regarding any symptoms during episodes. Gave direct DC phone number for return call.  Available ECG is from 06/27/18 at 1557, duration 42sec, median V rate 182bpm, appears SVT.

## 2018-07-08 ENCOUNTER — Other Ambulatory Visit: Payer: Self-pay | Admitting: Internal Medicine

## 2018-07-12 ENCOUNTER — Ambulatory Visit (INDEPENDENT_AMBULATORY_CARE_PROVIDER_SITE_OTHER): Payer: Medicare Other

## 2018-07-12 DIAGNOSIS — I639 Cerebral infarction, unspecified: Secondary | ICD-10-CM

## 2018-07-12 NOTE — Progress Notes (Signed)
Carelink Summary Report / Loop Recorder 

## 2018-07-19 ENCOUNTER — Other Ambulatory Visit: Payer: Self-pay | Admitting: Internal Medicine

## 2018-07-19 DIAGNOSIS — Z1231 Encounter for screening mammogram for malignant neoplasm of breast: Secondary | ICD-10-CM

## 2018-07-21 ENCOUNTER — Ambulatory Visit
Admission: RE | Admit: 2018-07-21 | Discharge: 2018-07-21 | Disposition: A | Discharge status: Home / Self Care | Payer: Medicare Other | Source: Ambulatory Visit | Attending: Internal Medicine | Admitting: Internal Medicine

## 2018-07-21 DIAGNOSIS — Z1231 Encounter for screening mammogram for malignant neoplasm of breast: Secondary | ICD-10-CM | POA: Diagnosis not present

## 2018-07-26 ENCOUNTER — Ambulatory Visit (INDEPENDENT_AMBULATORY_CARE_PROVIDER_SITE_OTHER): Payer: Medicare Other | Admitting: Internal Medicine

## 2018-07-26 ENCOUNTER — Encounter: Payer: Self-pay | Admitting: Internal Medicine

## 2018-07-26 VITALS — BP 122/80 | HR 72 | Ht 67.5 in | Wt 167.0 lb

## 2018-07-26 DIAGNOSIS — I471 Supraventricular tachycardia: Secondary | ICD-10-CM | POA: Diagnosis not present

## 2018-07-26 DIAGNOSIS — I639 Cerebral infarction, unspecified: Secondary | ICD-10-CM

## 2018-07-26 LAB — CUP PACEART INCLINIC DEVICE CHECK
Implantable Pulse Generator Implant Date: 20180927
MDC IDC SESS DTM: 20191202174228

## 2018-07-26 NOTE — Progress Notes (Signed)
PCP: Shon Baton, MD   Primary EP: Dr Rayann Heman  [name] is a 72 y.o. female who presents today for routine electrophysiology followup.  Since last being seen in our clinic, the patient reports doing very well.  Prior dizziness episodes have improved and correlated to sinus rhythm (not sinus brady) previously on ILR.  Today, she denies symptoms of palpitations, chest pain, shortness of breath,  lower extremity edema, dizziness, presyncope, or syncope.  The patient is otherwise without complaint today.   Past Medical History:  Diagnosis Date  . Allergy   . Cancer ([place])    skin cancer - on back, forehead  . Cryptogenic stroke [facility])    followed by Dr Jaynee Eagles  . Diverticulosis of colon 2007   Dr Olevia Perches  . History of basal cell carcinoma excision    X3  . History of colon polyps    NON-CANCEROUS  . History of kidney stones   . Hyperlipidemia   . OA (osteoarthritis of spine)   . PONV (postoperative nausea and vomiting)   . PSVT (paroxysmal supraventricular tachycardia) (HCC)    adenosine sensitive SVT previously documented  . Right ureteral stone   . Urgency of urination   . Wears glasses    Past Surgical History:  Procedure Laterality Date  . APPENDECTOMY  1950  . 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 CLOSED MANIPULATION POST TOTAL  KNEE ARTHROPLASTY  07-13-2006  . COLONOSCOPY W/ POLYPECTOMY  2002     adenomatous; Dr Olevia Perches  . EXTRACORPOREAL SHOCK WAVE LITHOTRIPSY  X3  . FACELIFT W/BLEPHAROPLASTY  2005  . HYSTEROSCOPY W/D&C  12-22-2000  . LOOP RECORDER INSERTION N/A 05/21/2017   Procedure: LOOP RECORDER INSERTION;  Surgeon: Thompson Grayer, MD;  Location: [place] [facility];  Service: Cardiovascular;  Laterality: N/A;  . OPEN KNEE MENISECTOMY  1964  . RIGHT URETEROSCOPIC STONE EXTRACTION / STENT PLACEMENT  08-21-2008  . rotator cuff on right shoulder    . TONSILLECTOMY  1950  . TOTAL KNEE  ARTHROPLASTY Right 05-30-2006  . TUBAL LIGATION    . WISDOM TOOTH EXTRACTION      ROS- all systems are reviewed and negatives except as per HPI above  Current Outpatient Medications  Medication Sig Dispense Refill  . alendronate (FOSAMAX) 70 MG tablet Take 70 mg by mouth once a week. Wednesdays    . aspirin 325 MG tablet Take 325 mg by mouth at bedtime.    . cetirizine (ZYRTEC) 10 MG tablet Take 10 mg by mouth daily.     . Cholecalciferol (VITAMIN D) 2000 units CAPS Take 1 capsule at bedtime by mouth.     . diazepam (VALIUM) 2 MG tablet Take 0.5-1 tablets (1-2 mg total) by mouth every 6 (six) hours as needed (vertigo). 15 tablet 3  . diltiazem (CARDIZEM) 120 MG tablet Take 1 tablet (120 mg total) by mouth daily. 90 tablet 3  . fluticasone (FLONASE) 50 MCG/ACT nasal spray Place 1 spray into both nostrils daily as needed for allergies.     Marland Kitchen meclizine (ANTIVERT) 25 MG tablet Take 25 mg by mouth 3 (three) times daily as needed for dizziness.    . pravastatin (PRAVACHOL) 40 MG tablet Take 40 mg by mouth daily.     No current facility-administered medications for this visit.     Physical Exam: Vitals:   07/26/18 1456  BP: 122/80  Pulse: 72  SpO2: 99%  Weight: 167  lb (75.8 kg)  Height: 5' 7.5" (1.715 m)    GEN- The patient is well appearing, alert and oriented x 3 today.   Head- normocephalic, atraumatic Eyes-  Sclera clear, conjunctiva pink Ears- hearing intact Oropharynx- clear Lungs- Clear to ausculation bilaterally, normal work of breathing Heart- Regular rate and rhythm, no murmurs, rubs or gallops, PMI not laterally displaced GI- soft, NT, ND, + BS Extremities- no clubbing, cyanosis, or edema  Wt Readings from Last 3 Encounters:  07/26/18 167 lb (75.8 kg)  01/21/18 171 lb 9.6 oz (77.8 kg)  11/16/17 170 lb 3.2 oz (77.2 kg)    EKG tracing ordered today is personally reviewed and shows sinus rhythm  Assessment and Plan:  1. Cryptogenic stroke No AF by ILR  interrogation to date  2. SVT Well controlled  Previously adenosine sensitive  3. Sinus bradycardia Asymptomatic She has occasional dizziness when heart rates are not slow.  There seems to be no correlation of her bradycardia with symptoms.  [facility] Return to see EP NP every 12 months I will see when needed  Thompson Grayer MD, [facility] 07/26/2018 3:06 PM

## 2018-07-26 NOTE — Patient Instructions (Signed)
Medication Instructions:  Your physician recommends that you continue on your current medications as directed. Please refer to the Current Medication list given to you today.  *If you need a refill on your cardiac medications before your next appointment, please call your pharmacy*  Labwork: None ordered  Testing/Procedures: None ordered  Follow-Up: Remote monitoring is used to monitor your loop recorder from home.  You are scheduled for a device check from home on 08/16/2018.  Your physician wants you to follow-up in: 1 year with Chanetta Marshall, NP.  You will receive a reminder letter in the mail two months in advance. If you don't receive a letter, please call our office to schedule the follow-up appointment.  Thank you for choosing [facility]!!

## 2018-07-30 DIAGNOSIS — M5137 Other intervertebral disc degeneration, lumbosacral region: Secondary | ICD-10-CM | POA: Diagnosis not present

## 2018-08-02 DIAGNOSIS — R82998 Other abnormal findings in urine: Secondary | ICD-10-CM | POA: Diagnosis not present

## 2018-08-02 DIAGNOSIS — E7849 Other hyperlipidemia: Secondary | ICD-10-CM | POA: Diagnosis not present

## 2018-08-02 DIAGNOSIS — E559 Vitamin D deficiency, unspecified: Secondary | ICD-10-CM | POA: Diagnosis not present

## 2018-08-09 DIAGNOSIS — E7849 Other hyperlipidemia: Secondary | ICD-10-CM | POA: Diagnosis not present

## 2018-08-09 DIAGNOSIS — K760 Fatty (change of) liver, not elsewhere classified: Secondary | ICD-10-CM | POA: Diagnosis not present

## 2018-08-09 DIAGNOSIS — Z95818 Presence of other cardiac implants and grafts: Secondary | ICD-10-CM | POA: Diagnosis not present

## 2018-08-09 DIAGNOSIS — D692 Other nonthrombocytopenic purpura: Secondary | ICD-10-CM | POA: Diagnosis not present

## 2018-08-09 DIAGNOSIS — Z0000 Encounter for general adult medical examination without abnormal findings: Secondary | ICD-10-CM | POA: Diagnosis not present

## 2018-08-09 DIAGNOSIS — I6990 Unspecified sequelae of unspecified cerebrovascular disease: Secondary | ICD-10-CM | POA: Diagnosis not present

## 2018-08-09 DIAGNOSIS — H8110 Benign paroxysmal vertigo, unspecified ear: Secondary | ICD-10-CM | POA: Diagnosis not present

## 2018-08-09 DIAGNOSIS — E559 Vitamin D deficiency, unspecified: Secondary | ICD-10-CM | POA: Diagnosis not present

## 2018-08-09 DIAGNOSIS — M810 Age-related osteoporosis without current pathological fracture: Secondary | ICD-10-CM | POA: Diagnosis not present

## 2018-08-09 DIAGNOSIS — I8310 Varicose veins of unspecified lower extremity with inflammation: Secondary | ICD-10-CM | POA: Diagnosis not present

## 2018-08-09 DIAGNOSIS — Z1389 Encounter for screening for other disorder: Secondary | ICD-10-CM | POA: Diagnosis not present

## 2018-08-09 DIAGNOSIS — I8390 Asymptomatic varicose veins of unspecified lower extremity: Secondary | ICD-10-CM | POA: Diagnosis not present

## 2018-08-16 ENCOUNTER — Ambulatory Visit (INDEPENDENT_AMBULATORY_CARE_PROVIDER_SITE_OTHER): Payer: Medicare Other

## 2018-08-16 DIAGNOSIS — I639 Cerebral infarction, unspecified: Secondary | ICD-10-CM | POA: Diagnosis not present

## 2018-08-16 LAB — CUP PACEART REMOTE DEVICE CHECK
Implantable Pulse Generator Implant Date: 20180927
MDC IDC SESS DTM: 20191221193823

## 2018-08-16 NOTE — Progress Notes (Signed)
Carelink Summary Report / Loop Recorder 

## 2018-08-29 LAB — CUP PACEART REMOTE DEVICE CHECK
Date Time Interrogation Session: 20191118193953
MDC IDC PG IMPLANT DT: 20180927

## 2018-09-07 ENCOUNTER — Ambulatory Visit (INDEPENDENT_AMBULATORY_CARE_PROVIDER_SITE_OTHER): Payer: Medicare Other | Admitting: Neurology

## 2018-09-07 ENCOUNTER — Encounter: Payer: Self-pay | Admitting: Neurology

## 2018-09-07 VITALS — BP 121/73 | HR 66 | Ht 67.5 in | Wt 171.0 lb

## 2018-09-07 DIAGNOSIS — G43809 Other migraine, not intractable, without status migrainosus: Secondary | ICD-10-CM

## 2018-09-07 DIAGNOSIS — G43109 Migraine with aura, not intractable, without status migrainosus: Secondary | ICD-10-CM | POA: Diagnosis not present

## 2018-09-07 DIAGNOSIS — R7989 Other specified abnormal findings of blood chemistry: Secondary | ICD-10-CM | POA: Diagnosis not present

## 2018-09-07 DIAGNOSIS — I63419 Cerebral infarction due to embolism of unspecified middle cerebral artery: Secondary | ICD-10-CM | POA: Diagnosis not present

## 2018-09-07 DIAGNOSIS — R42 Dizziness and giddiness: Secondary | ICD-10-CM

## 2018-09-07 NOTE — Progress Notes (Signed)
[facility]    rovider:  Dr Jaynee Eagles Referring Provider: Shon Baton, MD Primary Care Physician:  Shon Baton, MD CC: Stroke  Interval history 09/07/2018: She still has bouts of dizziness. Ativan doesn;t really help. Last one was before christmas for several weeks. Epley maneuvers don't help. She doesn't drink enough water. It can happen anytime. SHe feels dizzy, "out of sorts", No perception of movement, she feels lightheaded, sitting down does not make it better. She knows it is there, she can still get up and function, no falls. No problems with loop recorder. Her BP meds were adjusted and decreased and still having the episodes. ENT appointment in the past was normal. Her blood pressure is always low. Discussed vestibular migraines.   Interval history 10/26/2017: She has intermittent dizziness.  She went to the ED. It was late morning and waited for 8 hours. Need to repeat MRI brain. Vertigo was going on for a week, it was more dizziness, new symptoms. Given previous cryptogenic stroke and questionable white matter changes need repeat MRI brain   Interval history 04/22/2017: Patient is here with her son for the first time. Spent an extended visit reviewing patient's clinical history, reviewing images of initial MRI and most recent MRI of the brain, reviewing echocardiogram and vascular imaging, discussing next and possibility of atrial fibrillation or other etiologies of clot, discussed stroke, treatment for atrial fibrillation and answered all questions.  Interval history 03/05/2017: Patient is here for follow-up of subacute strokes that were seen on MRI of the brain. She was started on aspirin 325 as well as hemoglobin A1c, she follows with PCP for cholesterol and advised LDL goal is less than 70. CTA of the head showed no intracranial or extracranial stenosis or occlusion of significance. Echocardiogram of the heart showed no PFO or thrombus.  Cardiac telemetry did not show  atrial fibrillation.  HMC:NOBSJ L Dowtinis a 73 y.o.femalehere as a referral from Dr. Minta Balsam stroke. She has a history of palpitations. 10 years ago she had extensive palpitations. MRi results showed subacute strokes. She has a history of supraventricular tacycardia. No history of afib. PMHx OA, HLD, She was not on anti-platelet prior to dizziness. Dizziness since September, waxes and wanes, sometimes it is room spinning or imbalance. Currently she is fine but a week ago it was more imbalance when standing. She had a fall at [place], she was rushing and she fell and hit her head on th left side. She bit her lip. Otherwise she can;t remember any event that may have happened in the last several months coinciding with the MRI results. She tends to walk and catches her toe and has falls. She has had vestibular therapy but never for gait abnormality or falls. No other focal neurologic deficits, associated symptoms, inciting events or modifiable factors.  Reviewed notes, labs and imaging from outside physicians, which showed:  Reviewed ENT notes. She presented with a chief complaint of dizziness and hearing loss. This started last fall when she started feeling imbalance and standing. There has been some room spinning. Onset was fairly sudden. PCP prescribed meclizine. The feeling comes and goes and goes away for a few weeks and comes back for a few weeks. Meclizine is helpful. The feeling is constant even when not moving. Head movements do not affect the feeling. She has white noise all the time that worsens. There is an associated pressure feeling in the head. When she stands the imbalance worsens. She has trouble hearing high-pitched sounds. She work with  a physical therapist who did not find a problem. She has a remote history of migraines. Exam showed normal on exam, hearing test demonstrates symmetric sensorineural loss. Her imbalance symptoms not typical of an inner ear process. He recommended brain  imaging.  Personally reviewed MRI images and agree with the following:  IMPRESSION: Four foci of low level restricted diffusion and contrast enhancement in the deep white matter of the left hemisphere probably consistent with late subacute small vessel infarctions. These could be embolic or watershed. No evidence of cortical infarction. The differential diagnosis does include demyelinating disease, but that is felt less likely.  Mild chronic small-vessel ischemic change of the hemispheric white matter in addition.  LDL 96 in 2016  Review of Systems: Patient complains of symptoms per HPI as well as the following symptoms:no CP, no SOB. Pertinent negatives and positives per HPI. All others negative.   Social History   Socioeconomic History  . Marital status: Married    Spouse name: Not on file  . Number of children: 2  . Years of education: Not on file  . Highest education level: Bachelor's degree (e.g., BA, AB, BS)  Occupational History  . Not on file  Social Needs  . Financial resource strain: Not on file  . Food insecurity:    Worry: Not on file    Inability: Not on file  . Transportation needs:    Medical: Not on file    Non-medical: Not on file  Tobacco Use  . Smoking status: Never Smoker  . Smokeless tobacco: Never Used  Substance and Sexual Activity  . Alcohol use: Yes    Alcohol/week: 5.0 standard drinks    Types: 5 Glasses of wine per week  . Drug use: No  . Sexual activity: Not on file  Lifestyle  . Physical activity:    Days per week: Not on file    Minutes per session: Not on file  . Stress: Not on file  Relationships  . Social connections:    Talks on phone: Not on file    Gets together: Not on file    Attends religious service: Not on file    Active member of club or organization: Not on file    Attends meetings of clubs or organizations: Not on file    Relationship status: Not on file  . Intimate partner violence:    Fear of current or ex  partner: Not on file    Emotionally abused: Not on file    Physically abused: Not on file    Forced sexual activity: Not on file  Other Topics Concern  . Not on file  Social History Narrative   Lives at home in [place] with spouse.    Retired Careers information officer.   Active in the community.   Right handed   Drinks 1 cup of caffeine daily    Family History  Problem Relation Age of Onset  . Heart attack Mother 3  . Emphysema Father        smoker  . Colon polyps Father   . Breast cancer Sister   . Cancer Paternal Grandfather        throat cancer  . Esophageal cancer Paternal Grandfather   . Heart attack Maternal Grandfather 76  . Diabetes Neg Hx   . Stroke Neg Hx   . Colon cancer Neg Hx   . Rectal cancer Neg Hx   . Stomach cancer Neg Hx     Past Medical History:  Diagnosis Date  .  Allergy   . Cancer ([place])    skin cancer - on back, forehead  . Cryptogenic stroke [facility])    followed by Dr Jaynee Eagles  . Diverticulosis of colon 2007   Dr Olevia Perches  . History of basal cell carcinoma excision    X3  . History of colon polyps    NON-CANCEROUS  . History of kidney stones   . Hyperlipidemia   . OA (osteoarthritis of spine)   . PONV (postoperative nausea and vomiting)   . PSVT (paroxysmal supraventricular tachycardia) (HCC)    adenosine sensitive SVT previously documented  . Right ureteral stone   . Urgency of urination   . Wears glasses     Past Surgical History:  Procedure Laterality Date  . APPENDECTOMY  1950  . 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 CLOSED MANIPULATION POST TOTAL  KNEE ARTHROPLASTY  07-13-2006  . COLONOSCOPY W/ POLYPECTOMY  2002     adenomatous; Dr Olevia Perches  . epidural steroid injections     two rounds of injections; lumbar spine   . EXTRACORPOREAL SHOCK WAVE LITHOTRIPSY  X3  . FACELIFT W/BLEPHAROPLASTY  2005  . HYSTEROSCOPY W/D&C  12-22-2000  . LOOP RECORDER INSERTION N/A 05/21/2017    Procedure: LOOP RECORDER INSERTION;  Surgeon: Thompson Grayer, MD;  Location: [place] [facility];  Service: Cardiovascular;  Laterality: N/A;  . OPEN KNEE MENISECTOMY  1964  . RIGHT URETEROSCOPIC STONE EXTRACTION / STENT PLACEMENT  08-21-2008  . rotator cuff on right shoulder    . TONSILLECTOMY  1950  . TOTAL KNEE ARTHROPLASTY Right 05-30-2006  . TUBAL LIGATION    . WISDOM TOOTH EXTRACTION      Current Outpatient Medications  Medication Sig Dispense Refill  . alendronate (FOSAMAX) 70 MG tablet Take 70 mg by mouth once a week. Thursdays    . aspirin 325 MG tablet Take 325 mg by mouth at bedtime.    . cetirizine (ZYRTEC) 10 MG tablet Take 10 mg by mouth daily.     . diazepam (VALIUM) 2 MG tablet Take 0.5-1 tablets (1-2 mg total) by mouth every 6 (six) hours as needed (vertigo). 15 tablet 3  . diltiazem (CARDIZEM) 120 MG tablet Take 1 tablet (120 mg total) by mouth daily. 90 tablet 3  . fluticasone (FLONASE) 50 MCG/ACT nasal spray Place 1 spray into both nostrils daily as needed for allergies.     Marland Kitchen meclizine (ANTIVERT) 25 MG tablet Take 25 mg by mouth 3 (three) times daily as needed for dizziness.    . pravastatin (PRAVACHOL) 40 MG tablet Take 40 mg by mouth daily.    . Cholecalciferol (VITAMIN D) 2000 units CAPS Take 1 capsule at bedtime by mouth.      No current facility-administered medications for this visit.     Allergies as of 09/07/2018 - Review Complete 09/07/2018  Allergen Reaction Noted  . Morphine Other (See Comments) 01/19/2007  . Penicillins Rash 01/19/2007  . Levofloxacin Other (See Comments)     Vitals: BP 121/73 (BP Location: Right Arm, Patient Position: Sitting)   Pulse 66   Ht 5' 7.5" (1.715 m)   Wt 171 lb (77.6 kg)   BMI 26.39 kg/m  Last Weight:  Wt Readings from Last 1 Encounters:  09/07/18 171 lb (77.6 kg)   Last Height:   Ht Readings from Last 1 Encounters:  09/07/18 5' 7.5" (1.715 m)   Physical exam: Exam: Gen: NAD, conversant, well nourised,  obese,  well groomed                     CV: RRR, no MRG. No Carotid Bruits. No peripheral edema, warm, nontender Eyes: Conjunctivae clear without exudates or hemorrhage  Neuro: Detailed Neurologic Exam  Speech:    Speech is normal; fluent and spontaneous with normal comprehension.  Cognition:    The patient is oriented to person, place, and time;     recent and remote memory intact;     language fluent;     normal attention, concentration,     fund of knowledge Cranial Nerves:    The pupils are equal, round, and reactive to light. The fundi are normal and spontaneous venous pulsations are present. Visual fields are full to finger confrontation. Extraocular movements are intact. Trigeminal sensation is intact and the muscles of mastication are normal. The face is symmetric. The palate elevates in the midline. Hearing intact. Voice is normal. Shoulder shrug is normal. The tongue has normal motion without fasciculations.   Coordination:    Normal finger to nose and heel to shin. Normal rapid alternating movements.   Gait:    Heel-toe and tandem gait are normal.   Motor Observation:    No asymmetry, no atrophy, and no involuntary movements noted. Tone:    Normal muscle tone.    Posture:    Posture is normal. normal erect    Strength:    Strength is V/V in the upper and lower limbs.      Sensation: intact to LT     Reflex Exam:  DTR's:    Deep tendon reflexes in the upper and lower extremities are normal bilaterally.   Toes:    The toes are downgoing bilaterally.   Clonus:    Clonus is absent.   Assessment/Plan:73 year old evaluated with dizziness. MRI showed foci of low level restricted diffusion and contrast enhancement in the deep white matter of the left hemisphere probably consistent with late subacute embolic infarctions. However the stroke not c/w timeframe of start of dizzy spells 6 months prior.  Dizziness may be due to vestibular migraines, discussed at length with  patient. When you feel lightheaded take several blood pressure readings and drink lots of fluid May consider vestibular migraines (gave info from [employer] site) Will check thyroid panel due to elevated tsh Continue asa and managing stroke risk factors  Prior: Workup including CTA head and neck, echo, holter monitor unrevealing.  Patient will need loop recorder, will refer to Dr. Joylene Grapes: completed Physical therapy for gait and imbalance, falls: completed Needs vestibular therapy for persistent vertigo: completed Repeat MRI brain w/wo contrast with stroke and MS protocol due to worsening symptoms with thin cuts through the IACs due to questionable strokes vs MS int he past, abnormal white matter disease, evaluate for other leasions such as schwannoma  Continue ASA 325mg  daily  Orders Placed This Encounter  Procedures  . Thyroid Panel With TSH     Sarina Ill, MD  [facility] [address] [place], [place] [address]  Phone [phone] Fax [phone]  A total of 25 minutes was spent face-to-face with this patient. Over half this time was spent on counseling patient on the  1. Dizziness   2. Elevated TSH   3. Vestibular migraine   4. Cerebrovascular accident (CVA) due to embolism of middle cerebral artery, unspecified blood vessel laterality ([place])     diagnosis and different diagnostic and therapeutic options available, reviewing  diagnostics results, labs, instructions for management and follow-up.

## 2018-09-07 NOTE — Patient Instructions (Signed)
When you feel lightheaded take several blood pressure readings and drink lots of fluid May consider vestibular migraines (see documentation) Will check thyroid  Dizziness Dizziness is a common problem. It is a feeling of unsteadiness or light-headedness. You may feel like you are about to faint. Dizziness can lead to injury if you stumble or fall. Anyone can become dizzy, but dizziness is more common in older adults. This condition can be caused by a number of things, including medicines, dehydration, or illness. Follow these instructions at home: Eating and drinking  Drink enough fluid to keep your urine clear or pale yellow. This helps to keep you from becoming dehydrated. Try to drink more clear fluids, such as water.  Do not drink alcohol.  Limit your caffeine intake if told to do so by your health care provider. Check ingredients and nutrition facts to see if a food or beverage contains caffeine.  Limit your salt (sodium) intake if told to do so by your health care provider. Check ingredients and nutrition facts to see if a food or beverage contains sodium. Activity  Avoid making quick movements. ? Rise slowly from chairs and steady yourself until you feel okay. ? In the morning, first sit up on the side of the bed. When you feel okay, stand slowly while you hold onto something until you know that your balance is fine.  If you need to stand in one place for a long time, move your legs often. Tighten and relax the muscles in your legs while you are standing.  Do not drive or use heavy machinery if you feel dizzy.  Avoid bending down if you feel dizzy. Place items in your home so that they are easy for you to reach without leaning over. Lifestyle  Do not use any products that contain nicotine or tobacco, such as cigarettes and e-cigarettes. If you need help quitting, ask your health care provider.  Try to reduce your stress level by using methods such as yoga or meditation. Talk  with your health care provider if you need help to manage your stress. General instructions  Watch your dizziness for any changes.  Take over-the-counter and prescription medicines only as told by your health care provider. Talk with your health care provider if you think that your dizziness is caused by a medicine that you are taking.  Tell a friend or a family member that you are feeling dizzy. If he or she notices any changes in your behavior, have this person call your health care provider.  Keep all follow-up visits as told by your health care provider. This is important. Contact a health care provider if:  Your dizziness does not go away.  Your dizziness or light-headedness gets worse.  You feel nauseous.  You have reduced hearing.  You have new symptoms.  You are unsteady on your feet or you feel like the room is spinning. Get help right away if:  You vomit or have diarrhea and are unable to eat or drink anything.  You have problems talking, walking, swallowing, or using your arms, hands, or legs.  You feel generally weak.  You are not thinking clearly or you have trouble forming sentences. It may take a friend or family member to notice this.  You have chest pain, abdominal pain, shortness of breath, or sweating.  Your vision changes.  You have any bleeding.  You have a severe headache.  You have neck pain or a stiff neck.  You have a fever. These  symptoms may represent a serious problem that is an emergency. Do not wait to see if the symptoms will go away. Get medical help right away. Call your local emergency services (911 in the [place] Do not drive yourself to the hospital. Summary  Dizziness is a feeling of unsteadiness or light-headedness. This condition can be caused by a number of things, including medicines, dehydration, or illness.  Anyone can become dizzy, but dizziness is more common in older adults.  Drink enough fluid to keep your urine clear or  pale yellow. Do not drink alcohol.  Avoid making quick movements if you feel dizzy. Monitor your dizziness for any changes. This information is not intended to replace advice given to you by your health care provider. Make sure you discuss any questions you have with your health care provider. Document Released: 02/04/2001 Document Revised: 09/13/2016 Document Reviewed: 09/13/2016 Elsevier Interactive Patient Education  2019 [employer].

## 2018-09-08 LAB — THYROID PANEL WITH TSH
Free Thyroxine Index: 1.6 (ref 1.2–4.9)
T3 UPTAKE RATIO: 24 % (ref 24–39)
T4, Total: 6.8 ug/dL (ref 4.5–12.0)
TSH: 1.92 u[IU]/mL (ref 0.450–4.500)

## 2018-09-16 ENCOUNTER — Ambulatory Visit (INDEPENDENT_AMBULATORY_CARE_PROVIDER_SITE_OTHER): Payer: Medicare Other

## 2018-09-16 DIAGNOSIS — I639 Cerebral infarction, unspecified: Secondary | ICD-10-CM | POA: Diagnosis not present

## 2018-09-17 NOTE — Progress Notes (Signed)
Carelink Summary Report / Loop Recorder 

## 2018-09-19 LAB — CUP PACEART REMOTE DEVICE CHECK
Implantable Pulse Generator Implant Date: 20180927
MDC IDC SESS DTM: 20200123193605

## 2018-10-11 DIAGNOSIS — S63501A Unspecified sprain of right wrist, initial encounter: Secondary | ICD-10-CM | POA: Diagnosis not present

## 2018-10-11 DIAGNOSIS — M25531 Pain in right wrist: Secondary | ICD-10-CM | POA: Diagnosis not present

## 2018-10-19 ENCOUNTER — Ambulatory Visit (INDEPENDENT_AMBULATORY_CARE_PROVIDER_SITE_OTHER): Payer: Medicare Other | Admitting: *Deleted

## 2018-10-19 DIAGNOSIS — I639 Cerebral infarction, unspecified: Secondary | ICD-10-CM | POA: Diagnosis not present

## 2018-10-19 LAB — CUP PACEART REMOTE DEVICE CHECK
Date Time Interrogation Session: 20200225194007
Implantable Pulse Generator Implant Date: 20180927

## 2018-10-26 NOTE — Progress Notes (Signed)
Carelink Summary Report / Loop Recorder 

## 2018-11-22 ENCOUNTER — Ambulatory Visit (INDEPENDENT_AMBULATORY_CARE_PROVIDER_SITE_OTHER): Payer: Medicare Other | Admitting: *Deleted

## 2018-11-22 ENCOUNTER — Other Ambulatory Visit: Payer: Self-pay

## 2018-11-22 DIAGNOSIS — I639 Cerebral infarction, unspecified: Secondary | ICD-10-CM

## 2018-11-22 LAB — CUP PACEART REMOTE DEVICE CHECK
Date Time Interrogation Session: 20200329201210
Implantable Pulse Generator Implant Date: 20180927

## 2018-11-29 NOTE — Progress Notes (Signed)
Carelink Summary Report / Loop Recorder 

## 2018-12-24 ENCOUNTER — Other Ambulatory Visit: Payer: Self-pay

## 2018-12-24 ENCOUNTER — Ambulatory Visit (INDEPENDENT_AMBULATORY_CARE_PROVIDER_SITE_OTHER): Payer: Medicare Other | Admitting: *Deleted

## 2018-12-24 DIAGNOSIS — I639 Cerebral infarction, unspecified: Secondary | ICD-10-CM | POA: Diagnosis not present

## 2018-12-26 LAB — CUP PACEART REMOTE DEVICE CHECK
Date Time Interrogation Session: 20200501204216
Implantable Pulse Generator Implant Date: 20180927

## 2018-12-30 NOTE — Progress Notes (Signed)
Carelink Summary Report / Loop Recorder 

## 2019-01-04 ENCOUNTER — Other Ambulatory Visit: Payer: Self-pay | Admitting: Neurology

## 2019-01-04 ENCOUNTER — Telehealth: Payer: Self-pay | Admitting: Neurology

## 2019-01-04 MED ORDER — MECLIZINE HCL 25 MG PO TABS: 25.0000 mg | ORAL_TABLET | Freq: Three times a day (TID) | ORAL | 6 refills | Status: DC | PRN

## 2019-01-04 NOTE — Telephone Encounter (Signed)
Spoke with pt & advised Dr. Jaynee Eagles sent refill of Meclizine to pt's pharmacy. She verbalized appreciation.

## 2019-01-04 NOTE — Telephone Encounter (Signed)
Pt called in and requested a refill meclizine (ANTIVERT) 25 MG tablet , advised pt this was not filled by Dr Jaynee Eagles , she states she was advised by Dr Jaynee Eagles that she would fill it for her.  [id number] - [place], [place] - [place]

## 2019-01-04 NOTE — Telephone Encounter (Signed)
completed

## 2019-01-07 ENCOUNTER — Other Ambulatory Visit: Payer: Self-pay | Admitting: Internal Medicine

## 2019-01-26 ENCOUNTER — Ambulatory Visit (INDEPENDENT_AMBULATORY_CARE_PROVIDER_SITE_OTHER): Payer: Medicare Other | Admitting: *Deleted

## 2019-01-26 DIAGNOSIS — I639 Cerebral infarction, unspecified: Secondary | ICD-10-CM | POA: Diagnosis not present

## 2019-01-27 LAB — CUP PACEART REMOTE DEVICE CHECK
Date Time Interrogation Session: 20200603214022
Implantable Pulse Generator Implant Date: 20180927

## 2019-02-03 ENCOUNTER — Encounter: Payer: Self-pay | Admitting: Cardiology

## 2019-02-03 NOTE — Progress Notes (Signed)
Carelink Summary Report / Loop Recorder 

## 2019-02-22 DIAGNOSIS — I6990 Unspecified sequelae of unspecified cerebrovascular disease: Secondary | ICD-10-CM | POA: Diagnosis not present

## 2019-02-22 DIAGNOSIS — Z95818 Presence of other cardiac implants and grafts: Secondary | ICD-10-CM | POA: Diagnosis not present

## 2019-02-22 DIAGNOSIS — E785 Hyperlipidemia, unspecified: Secondary | ICD-10-CM | POA: Diagnosis not present

## 2019-02-22 DIAGNOSIS — D692 Other nonthrombocytopenic purpura: Secondary | ICD-10-CM | POA: Diagnosis not present

## 2019-02-22 DIAGNOSIS — M810 Age-related osteoporosis without current pathological fracture: Secondary | ICD-10-CM | POA: Diagnosis not present

## 2019-02-28 ENCOUNTER — Ambulatory Visit (INDEPENDENT_AMBULATORY_CARE_PROVIDER_SITE_OTHER): Payer: Medicare Other | Admitting: *Deleted

## 2019-02-28 DIAGNOSIS — I639 Cerebral infarction, unspecified: Secondary | ICD-10-CM

## 2019-03-01 DIAGNOSIS — H353122 Nonexudative age-related macular degeneration, left eye, intermediate dry stage: Secondary | ICD-10-CM | POA: Diagnosis not present

## 2019-03-01 DIAGNOSIS — H524 Presbyopia: Secondary | ICD-10-CM | POA: Diagnosis not present

## 2019-03-01 DIAGNOSIS — H5203 Hypermetropia, bilateral: Secondary | ICD-10-CM | POA: Diagnosis not present

## 2019-03-01 DIAGNOSIS — H43813 Vitreous degeneration, bilateral: Secondary | ICD-10-CM | POA: Diagnosis not present

## 2019-03-01 DIAGNOSIS — H11153 Pinguecula, bilateral: Secondary | ICD-10-CM | POA: Diagnosis not present

## 2019-03-01 DIAGNOSIS — H52223 Regular astigmatism, bilateral: Secondary | ICD-10-CM | POA: Diagnosis not present

## 2019-03-01 DIAGNOSIS — H25813 Combined forms of age-related cataract, bilateral: Secondary | ICD-10-CM | POA: Diagnosis not present

## 2019-03-01 LAB — CUP PACEART REMOTE DEVICE CHECK
Date Time Interrogation Session: 20200706221000
Implantable Pulse Generator Implant Date: 20180927

## 2019-03-07 NOTE — Progress Notes (Signed)
Carelink Summary Report / Loop Recorder 

## 2019-03-16 DIAGNOSIS — Z1159 Encounter for screening for other viral diseases: Secondary | ICD-10-CM | POA: Diagnosis not present

## 2019-03-31 DIAGNOSIS — R350 Frequency of micturition: Secondary | ICD-10-CM | POA: Diagnosis not present

## 2019-03-31 DIAGNOSIS — N819 Female genital prolapse, unspecified: Secondary | ICD-10-CM | POA: Diagnosis not present

## 2019-03-31 DIAGNOSIS — M545 Low back pain: Secondary | ICD-10-CM | POA: Diagnosis not present

## 2019-04-03 LAB — CUP PACEART REMOTE DEVICE CHECK
Date Time Interrogation Session: 20200808223600
Implantable Pulse Generator Implant Date: 20180927

## 2019-04-04 ENCOUNTER — Ambulatory Visit (INDEPENDENT_AMBULATORY_CARE_PROVIDER_SITE_OTHER): Payer: Medicare Other | Admitting: *Deleted

## 2019-04-04 DIAGNOSIS — I639 Cerebral infarction, unspecified: Secondary | ICD-10-CM | POA: Diagnosis not present

## 2019-04-12 NOTE — Progress Notes (Signed)
Carelink Summary Report / Loop Recorder 

## 2019-05-04 DIAGNOSIS — N814 Uterovaginal prolapse, unspecified: Secondary | ICD-10-CM | POA: Diagnosis not present

## 2019-05-04 DIAGNOSIS — N3941 Urge incontinence: Secondary | ICD-10-CM | POA: Diagnosis not present

## 2019-05-05 ENCOUNTER — Ambulatory Visit (INDEPENDENT_AMBULATORY_CARE_PROVIDER_SITE_OTHER): Payer: Medicare Other | Admitting: *Deleted

## 2019-05-05 DIAGNOSIS — I639 Cerebral infarction, unspecified: Secondary | ICD-10-CM | POA: Diagnosis not present

## 2019-05-06 LAB — CUP PACEART REMOTE DEVICE CHECK
Date Time Interrogation Session: 20200910223819
Implantable Pulse Generator Implant Date: 20180927

## 2019-05-12 DIAGNOSIS — Z23 Encounter for immunization: Secondary | ICD-10-CM | POA: Diagnosis not present

## 2019-05-13 NOTE — Progress Notes (Signed)
Carelink Summary Report / Loop Recorder 

## 2019-06-06 ENCOUNTER — Other Ambulatory Visit: Payer: Self-pay | Admitting: Internal Medicine

## 2019-06-07 ENCOUNTER — Ambulatory Visit (INDEPENDENT_AMBULATORY_CARE_PROVIDER_SITE_OTHER): Payer: Medicare Other | Admitting: *Deleted

## 2019-06-07 ENCOUNTER — Other Ambulatory Visit: Payer: Self-pay | Admitting: Internal Medicine

## 2019-06-07 DIAGNOSIS — M810 Age-related osteoporosis without current pathological fracture: Secondary | ICD-10-CM

## 2019-06-07 DIAGNOSIS — I639 Cerebral infarction, unspecified: Secondary | ICD-10-CM | POA: Diagnosis not present

## 2019-06-08 ENCOUNTER — Other Ambulatory Visit: Payer: Self-pay | Admitting: Internal Medicine

## 2019-06-08 DIAGNOSIS — Z1231 Encounter for screening mammogram for malignant neoplasm of breast: Secondary | ICD-10-CM

## 2019-06-08 LAB — CUP PACEART REMOTE DEVICE CHECK
Date Time Interrogation Session: 20201013224411
Implantable Pulse Generator Implant Date: 20180927

## 2019-06-17 NOTE — Progress Notes (Signed)
Carelink Summary Report / Loop Recorder 

## 2019-07-04 DIAGNOSIS — M4120 Other idiopathic scoliosis, site unspecified: Secondary | ICD-10-CM | POA: Diagnosis not present

## 2019-07-04 DIAGNOSIS — M4316 Spondylolisthesis, lumbar region: Secondary | ICD-10-CM | POA: Diagnosis not present

## 2019-07-04 DIAGNOSIS — Z6825 Body mass index (BMI) 25.0-25.9, adult: Secondary | ICD-10-CM | POA: Diagnosis not present

## 2019-07-04 DIAGNOSIS — R030 Elevated blood-pressure reading, without diagnosis of hypertension: Secondary | ICD-10-CM | POA: Diagnosis not present

## 2019-07-04 DIAGNOSIS — M5416 Radiculopathy, lumbar region: Secondary | ICD-10-CM | POA: Diagnosis not present

## 2019-07-04 DIAGNOSIS — M545 Low back pain: Secondary | ICD-10-CM | POA: Diagnosis not present

## 2019-07-08 DIAGNOSIS — M810 Age-related osteoporosis without current pathological fracture: Secondary | ICD-10-CM | POA: Diagnosis not present

## 2019-07-10 LAB — CUP PACEART REMOTE DEVICE CHECK
Date Time Interrogation Session: 20201115192245
Implantable Pulse Generator Implant Date: 20180927

## 2019-07-11 ENCOUNTER — Ambulatory Visit (INDEPENDENT_AMBULATORY_CARE_PROVIDER_SITE_OTHER): Payer: Medicare Other | Admitting: *Deleted

## 2019-07-11 DIAGNOSIS — I639 Cerebral infarction, unspecified: Secondary | ICD-10-CM

## 2019-07-13 ENCOUNTER — Other Ambulatory Visit: Payer: Medicare Other

## 2019-07-14 ENCOUNTER — Other Ambulatory Visit: Payer: Self-pay

## 2019-07-14 ENCOUNTER — Telehealth: Payer: Self-pay | Admitting: Internal Medicine

## 2019-07-14 DIAGNOSIS — M4316 Spondylolisthesis, lumbar region: Secondary | ICD-10-CM | POA: Diagnosis not present

## 2019-07-14 DIAGNOSIS — M4807 Spinal stenosis, lumbosacral region: Secondary | ICD-10-CM | POA: Diagnosis not present

## 2019-07-14 DIAGNOSIS — M47816 Spondylosis without myelopathy or radiculopathy, lumbar region: Secondary | ICD-10-CM | POA: Diagnosis not present

## 2019-07-14 DIAGNOSIS — Z20828 Contact with and (suspected) exposure to other viral communicable diseases: Secondary | ICD-10-CM | POA: Diagnosis not present

## 2019-07-14 DIAGNOSIS — M5127 Other intervertebral disc displacement, lumbosacral region: Secondary | ICD-10-CM | POA: Diagnosis not present

## 2019-07-14 DIAGNOSIS — Z20822 Contact with and (suspected) exposure to covid-19: Secondary | ICD-10-CM

## 2019-07-14 NOTE — Telephone Encounter (Signed)
New Message:  Patient is scheduled to have an MRI today at 2:00 pm. She has a loop recorder implant and wants to know if it will be safe for her to get an MRI. She says he would have called sooner but just got scheduled for the MRI yesterday. Please Advise

## 2019-07-14 NOTE — Telephone Encounter (Signed)
I let the pt know her Loop was MRI safe. She states her monitor been flashing lights in the middle of the night. I gave her the number to [place] support to get additional help.

## 2019-07-17 LAB — NOVEL CORONAVIRUS, NAA: SARS-CoV-2, NAA: NOT DETECTED

## 2019-07-20 DIAGNOSIS — M5416 Radiculopathy, lumbar region: Secondary | ICD-10-CM | POA: Diagnosis not present

## 2019-07-20 DIAGNOSIS — M8580 Other specified disorders of bone density and structure, unspecified site: Secondary | ICD-10-CM | POA: Diagnosis not present

## 2019-07-20 DIAGNOSIS — M4316 Spondylolisthesis, lumbar region: Secondary | ICD-10-CM | POA: Diagnosis not present

## 2019-07-20 DIAGNOSIS — M545 Low back pain: Secondary | ICD-10-CM | POA: Diagnosis not present

## 2019-07-20 DIAGNOSIS — M4120 Other idiopathic scoliosis, site unspecified: Secondary | ICD-10-CM | POA: Diagnosis not present

## 2019-07-26 ENCOUNTER — Other Ambulatory Visit: Payer: Self-pay

## 2019-07-26 ENCOUNTER — Ambulatory Visit
Admission: RE | Admit: 2019-07-26 | Discharge: 2019-07-26 | Disposition: A | Discharge status: Home / Self Care | Payer: Medicare Other | Source: Ambulatory Visit | Attending: Internal Medicine | Admitting: Internal Medicine

## 2019-07-26 DIAGNOSIS — Z1231 Encounter for screening mammogram for malignant neoplasm of breast: Secondary | ICD-10-CM

## 2019-08-04 NOTE — Progress Notes (Signed)
Carelink Summary Report / Loop Recorder 

## 2019-08-10 ENCOUNTER — Other Ambulatory Visit: Payer: Self-pay | Admitting: Internal Medicine

## 2019-08-12 ENCOUNTER — Ambulatory Visit (INDEPENDENT_AMBULATORY_CARE_PROVIDER_SITE_OTHER): Payer: Medicare Other | Admitting: *Deleted

## 2019-08-12 DIAGNOSIS — I639 Cerebral infarction, unspecified: Secondary | ICD-10-CM | POA: Diagnosis not present

## 2019-08-13 LAB — CUP PACEART REMOTE DEVICE CHECK
Date Time Interrogation Session: 20201218175440
Implantable Pulse Generator Implant Date: 20180927

## 2019-08-24 ENCOUNTER — Encounter: Payer: Self-pay | Admitting: *Deleted

## 2019-08-24 DIAGNOSIS — N201 Calculus of ureter: Secondary | ICD-10-CM | POA: Insufficient documentation

## 2019-09-01 DIAGNOSIS — M545 Low back pain: Secondary | ICD-10-CM | POA: Diagnosis not present

## 2019-09-01 DIAGNOSIS — M4120 Other idiopathic scoliosis, site unspecified: Secondary | ICD-10-CM | POA: Diagnosis not present

## 2019-09-04 ENCOUNTER — Ambulatory Visit: Payer: Medicare Other | Attending: Internal Medicine

## 2019-09-04 DIAGNOSIS — Z23 Encounter for immunization: Secondary | ICD-10-CM | POA: Diagnosis not present

## 2019-09-04 NOTE — Progress Notes (Signed)
   Covid-19 Vaccination Clinic  Name:  Sarah Valdez    MRN: [id number] DOB: 1946/05/19  09/04/2019  Ms. Yingling was observed post Covid-19 immunization for 30 minutes based on pre-vaccination screening without incidence. She was provided with Vaccine Information Sheet and instruction to access the V-Safe system.   Ms. Herriage was instructed to call 911 with any severe reactions post vaccine: Marland Kitchen Difficulty breathing  . Swelling of your face and throat  . A fast heartbeat  . A bad rash all over your body  . Dizziness and weakness    Immunizations Administered    Name Date Dose VIS Date Route   Pfizer COVID-19 Vaccine 09/04/2019 12:17 PM 0.3 mL 08/05/2019 Intramuscular   Manufacturer: [place]   Lot: [id number]   [place]: [id number]

## 2019-09-05 ENCOUNTER — Encounter: Payer: Self-pay | Admitting: Nurse Practitioner

## 2019-09-05 ENCOUNTER — Ambulatory Visit (INDEPENDENT_AMBULATORY_CARE_PROVIDER_SITE_OTHER): Payer: Medicare Other | Admitting: Nurse Practitioner

## 2019-09-05 ENCOUNTER — Other Ambulatory Visit: Payer: Self-pay

## 2019-09-05 ENCOUNTER — Ambulatory Visit: Payer: Medicare Other

## 2019-09-05 VITALS — BP 110/72 | HR 93 | Ht 67.5 in | Wt 154.2 lb

## 2019-09-05 DIAGNOSIS — I639 Cerebral infarction, unspecified: Secondary | ICD-10-CM

## 2019-09-05 DIAGNOSIS — I471 Supraventricular tachycardia: Secondary | ICD-10-CM | POA: Diagnosis not present

## 2019-09-05 DIAGNOSIS — R001 Bradycardia, unspecified: Secondary | ICD-10-CM | POA: Diagnosis not present

## 2019-09-05 NOTE — Progress Notes (Signed)
Electrophysiology Office Note Date: 09/05/2019  ID:  Sarah Valdez, DOB 12-20-1945, MRN [id number]  PCP: Shon Baton, MD Electrophysiologist: Rayann Heman  CC: follow up  Sarah Valdez is a 74 y.o. female seen today for Dr Rayann Heman.  She presents today for routine electrophysiology followup.  Since last being seen in our clinic, the patient reports doing very well.  She got her first dose of the COVID vaccine yesterday and is feeling well.  She has occasional dizziness that is helped w Meclizine.  She denies chest pain, palpitations, dyspnea, PND, orthopnea, nausea, vomiting, syncope, edema, weight gain, or early satiety.  Past Medical History:  Diagnosis Date  . Allergy   . Bilateral sensorineural hearing loss 11/06/2016  . Cancer ([place])    skin cancer - on back, forehead  . Chronic congestive heart failure ([place]) 02/22/2018  . Cryptogenic stroke [facility])    followed by Dr Jaynee Eagles  . Diverticulosis of colon 2007   Dr Olevia Perches  . Gallstones 01/20/2014   12/2013 multiple gallstones, largest 1.7 cm in diameter; asymptomatic   . History of basal cell carcinoma excision    X3  . History of colon polyps    NON-CANCEROUS  . History of kidney stones   . Hyperlipidemia   . OA (osteoarthritis of spine)   . Palpitations 11/24/2007   Qualifier: Diagnosis of  By: Linna Darner MD, William    . Pancreatic cyst 01/20/2014   01/03/14 8 x 11 mm nonaggressive cystic lesion the pancreas. Followup recommended in one year.   Marland Kitchen PONV (postoperative nausea and vomiting)   . PSVT (paroxysmal supraventricular tachycardia) (HCC)    adenosine sensitive SVT previously documented  . Right ureteral stone   . Urgency of urination   . Wears glasses    Past Surgical History:  Procedure Laterality Date  . APPENDECTOMY  1950  . 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 CLOSED MANIPULATION POST TOTAL  KNEE ARTHROPLASTY  07-13-2006  . COLONOSCOPY W/  POLYPECTOMY  2002     adenomatous; Dr Olevia Perches  . epidural steroid injections     two rounds of injections; lumbar spine   . EXTRACORPOREAL SHOCK WAVE LITHOTRIPSY  X3  . FACELIFT W/BLEPHAROPLASTY  2005  . HYSTEROSCOPY WITH D & C  12-22-2000  . LOOP RECORDER INSERTION N/A 05/21/2017   Procedure: LOOP RECORDER INSERTION;  Surgeon: Thompson Grayer, MD;  Location: [place] [facility];  Service: Cardiovascular;  Laterality: N/A;  . OPEN KNEE MENISECTOMY  1964  . RIGHT URETEROSCOPIC STONE EXTRACTION / STENT PLACEMENT  08-21-2008  . rotator cuff on right shoulder    . TONSILLECTOMY  1950  . TOTAL KNEE ARTHROPLASTY Right 05-30-2006  . TUBAL LIGATION    . WISDOM TOOTH EXTRACTION      Current Outpatient Medications  Medication Sig Dispense Refill  . alendronate (FOSAMAX) 70 MG tablet Take 70 mg by mouth once a week. Thursdays    . aspirin 325 MG tablet Take 325 mg by mouth at bedtime.    . cetirizine (ZYRTEC) 10 MG tablet Take 10 mg by mouth daily.     . Cholecalciferol (VITAMIN D) 2000 units CAPS Take 1 capsule at bedtime by mouth.     . diltiazem (CARDIZEM) 120 MG tablet Take 1 tablet (120 mg total) by mouth daily. 90 tablet 0  . fluticasone (FLONASE) 50 MCG/ACT nasal spray Place 1 spray into both nostrils daily as needed for allergies.     Marland Kitchen  meclizine (ANTIVERT) 25 MG tablet Take 1 tablet (25 mg total) by mouth 3 (three) times daily as needed for dizziness. 60 tablet 6  . pravastatin (PRAVACHOL) 40 MG tablet Take 40 mg by mouth daily.     No current facility-administered medications for this visit.    Allergies:   Morphine, Penicillins, and Levofloxacin   Social History: Social History   Socioeconomic History  . Marital status: Married    Spouse name: Not on file  . Number of children: 2  . Years of education: Not on file  . Highest education level: Bachelor's degree (e.g., BA, AB, BS)  Occupational History  . Not on file  Tobacco Use  . Smoking status: Never Smoker  . Smokeless  tobacco: Never Used  Substance and Sexual Activity  . Alcohol use: Yes    Alcohol/week: 5.0 standard drinks    Types: 5 Glasses of wine per week  . Drug use: No  . Sexual activity: Not on file  Other Topics Concern  . Not on file  Social History Narrative   Lives at home in [place] with spouse.    Retired Careers information officer.   Active in the community.   Right handed   Drinks 1 cup of caffeine daily   Social Determinants of Health   Financial Resource Strain:   . Difficulty of Paying Living Expenses: Not on file  Food Insecurity:   . Worried About Charity fundraiser in the Last Year: Not on file  . Ran Out of Food in the Last Year: Not on file  Transportation Needs:   . Lack of Transportation (Medical): Not on file  . Lack of Transportation (Non-Medical): Not on file  Physical Activity:   . Days of Exercise per Week: Not on file  . Minutes of Exercise per Session: Not on file  Stress:   . Feeling of Stress : Not on file  Social Connections:   . Frequency of Communication with Friends and Family: Not on file  . Frequency of Social Gatherings with Friends and Family: Not on file  . Attends Religious Services: Not on file  . Active Member of Clubs or Organizations: Not on file  . Attends Archivist Meetings: Not on file  . Marital Status: Not on file  Intimate Partner Violence:   . Fear of Current or Ex-Partner: Not on file  . Emotionally Abused: Not on file  . Physically Abused: Not on file  . Sexually Abused: Not on file    Family History: Family History  Problem Relation Age of Onset  . Heart attack Mother 33  . Emphysema Father        smoker  . Colon polyps Father   . Breast cancer Sister   . Cancer Paternal Grandfather        throat cancer  . Esophageal cancer Paternal Grandfather   . Heart attack Maternal Grandfather 76  . Diabetes Neg Hx   . Stroke Neg Hx   . Colon cancer Neg Hx   . Rectal cancer Neg Hx   . Stomach cancer Neg Hx     Review of  Systems: All other systems reviewed and are otherwise negative except as noted above.   Physical Exam: VS:  BP 110/72   Pulse 93   Ht 5' 7.5" (1.715 m)   Wt 154 lb 3.2 oz (69.9 kg)   SpO2 97%   BMI 23.79 kg/m  , BMI Body mass index is 23.79 kg/m. Wt Readings from  Last 3 Encounters:  09/05/19 154 lb 3.2 oz (69.9 kg)  09/07/18 171 lb (77.6 kg)  07/26/18 167 lb (75.8 kg)    GEN- The patient is well appearing, alert and oriented x 3 today.   HEENT: normocephalic, atraumatic; sclera clear, conjunctiva pink; hearing intact; oropharynx clear; neck supple  Lungs- Clear to ausculation bilaterally, normal work of breathing.  No wheezes, rales, rhonchi Heart- Regular rate and rhythm GI- soft, non-tender, non-distended, bowel sounds present  Extremities- no clubbing, cyanosis, or edema  MS- no significant deformity or atrophy Skin- warm and dry, no rash or lesion  Psych- euthymic mood, full affect Neuro- strength and sensation are intact   EKG:  EKG is not ordered today.  Recent Labs: 09/07/2018: TSH 1.920    Other studies Reviewed: Additional studies/ records that were reviewed today include: Dr Jackalyn Lombard office notes   Assessment and Plan:  1.  Cryptogenic stroke No AF to date by ILR Continue remote monitoring  2.  Sinus bradycardia Asymptomatic  3.  SVT No recurrence Previously adenosine sensitive   Current medicines are reviewed at length with the patient today.   The patient does not have concerns regarding her medicines.  The following changes were made today:  none  Labs/ tests ordered today include: none No orders of the defined types were placed in this encounter.    Disposition:   Follow up with me in 1 year      Signed, Chanetta Marshall, NP 09/05/2019 10:49 AM   [place] [address] [address] [phone] (office) [phone] (fax)

## 2019-09-05 NOTE — Patient Instructions (Signed)
Medication Instructions:  none *If you need a refill on your cardiac medications before your next appointment, please call your pharmacy*  Lab Work: none If you have labs (blood work) drawn today and your tests are completely normal, you will receive your results only by: . MyChart Message (if you have MyChart) OR . A paper copy in the mail If you have any lab test that is abnormal or we need to change your treatment, we will call you to review the results.  Testing/Procedures: none  Follow-Up: At [facility], you and your health needs are our priority.  As part of our continuing mission to provide you with exceptional heart care, we have created designated Provider Care Teams.  These Care Teams include your primary Cardiologist (physician) and Advanced Practice Providers (APPs -  Physician Assistants and Nurse Practitioners) who all work together to provide you with the care you need, when you need it.  Your next appointment:   1 year(s)  The format for your next appointment:   Either In Person or Virtual  Provider:   Dr Allred  Other Instructions   

## 2019-09-06 DIAGNOSIS — I8390 Asymptomatic varicose veins of unspecified lower extremity: Secondary | ICD-10-CM | POA: Diagnosis not present

## 2019-09-06 DIAGNOSIS — E559 Vitamin D deficiency, unspecified: Secondary | ICD-10-CM | POA: Diagnosis not present

## 2019-09-06 DIAGNOSIS — Z95818 Presence of other cardiac implants and grafts: Secondary | ICD-10-CM | POA: Diagnosis not present

## 2019-09-06 DIAGNOSIS — I6990 Unspecified sequelae of unspecified cerebrovascular disease: Secondary | ICD-10-CM | POA: Diagnosis not present

## 2019-09-06 DIAGNOSIS — M545 Low back pain: Secondary | ICD-10-CM | POA: Diagnosis not present

## 2019-09-06 DIAGNOSIS — R21 Rash and other nonspecific skin eruption: Secondary | ICD-10-CM | POA: Diagnosis not present

## 2019-09-06 DIAGNOSIS — Z0000 Encounter for general adult medical examination without abnormal findings: Secondary | ICD-10-CM | POA: Diagnosis not present

## 2019-09-06 DIAGNOSIS — D692 Other nonthrombocytopenic purpura: Secondary | ICD-10-CM | POA: Diagnosis not present

## 2019-09-06 DIAGNOSIS — E785 Hyperlipidemia, unspecified: Secondary | ICD-10-CM | POA: Diagnosis not present

## 2019-09-06 DIAGNOSIS — N814 Uterovaginal prolapse, unspecified: Secondary | ICD-10-CM | POA: Diagnosis not present

## 2019-09-06 DIAGNOSIS — M810 Age-related osteoporosis without current pathological fracture: Secondary | ICD-10-CM | POA: Diagnosis not present

## 2019-09-06 DIAGNOSIS — I8310 Varicose veins of unspecified lower extremity with inflammation: Secondary | ICD-10-CM | POA: Diagnosis not present

## 2019-09-06 DIAGNOSIS — Z1331 Encounter for screening for depression: Secondary | ICD-10-CM | POA: Diagnosis not present

## 2019-09-13 ENCOUNTER — Ambulatory Visit: Payer: Medicare Other | Admitting: Neurology

## 2019-09-13 ENCOUNTER — Telehealth: Payer: Self-pay | Admitting: *Deleted

## 2019-09-13 NOTE — Telephone Encounter (Signed)
Tried to reach pt per Dr. Jaynee Eagles. LVM on mobile asking pt if she is doing well Dr. Jaynee Eagles recommends, d/t the covid pandemic, r/s today's appt for 3 months out.

## 2019-09-14 ENCOUNTER — Ambulatory Visit (INDEPENDENT_AMBULATORY_CARE_PROVIDER_SITE_OTHER): Payer: Medicare Other | Admitting: *Deleted

## 2019-09-14 DIAGNOSIS — I639 Cerebral infarction, unspecified: Secondary | ICD-10-CM | POA: Diagnosis not present

## 2019-09-15 LAB — CUP PACEART REMOTE DEVICE CHECK
Date Time Interrogation Session: 20210120180030
Implantable Pulse Generator Implant Date: 20180927

## 2019-09-20 DIAGNOSIS — Z1212 Encounter for screening for malignant neoplasm of rectum: Secondary | ICD-10-CM | POA: Diagnosis not present

## 2019-09-22 ENCOUNTER — Ambulatory Visit: Payer: Medicare Other

## 2019-09-24 ENCOUNTER — Ambulatory Visit: Payer: Medicare Other | Attending: Internal Medicine

## 2019-09-24 DIAGNOSIS — Z23 Encounter for immunization: Secondary | ICD-10-CM | POA: Insufficient documentation

## 2019-09-24 NOTE — Progress Notes (Signed)
   Covid-19 Vaccination Clinic  Name:  [name]    MRN: [id number] DOB: 1946/03/29  09/24/2019  [name] was observed post Covid-19 immunization for 30 minutes based on pre-vaccination screening without incidence. She was provided with Vaccine Information Sheet and instruction to access the V-Safe system.   [name] was instructed to call 911 with any severe reactions post vaccine: Marland Kitchen Difficulty breathing  . Swelling of your face and throat  . A fast heartbeat  . A bad rash all over your body  . Dizziness and weakness    Immunizations Administered    Name Date Dose VIS Date Route   Pfizer COVID-19 Vaccine 09/24/2019 10:57 AM 0.3 mL 08/05/2019 Intramuscular   Manufacturer: [place]   Lot: [id number]   [place]: [id number]

## 2019-09-26 DIAGNOSIS — M545 Low back pain: Secondary | ICD-10-CM | POA: Diagnosis not present

## 2019-09-26 DIAGNOSIS — Z6826 Body mass index (BMI) 26.0-26.9, adult: Secondary | ICD-10-CM | POA: Diagnosis not present

## 2019-09-26 DIAGNOSIS — M5416 Radiculopathy, lumbar region: Secondary | ICD-10-CM | POA: Diagnosis not present

## 2019-09-26 DIAGNOSIS — M4316 Spondylolisthesis, lumbar region: Secondary | ICD-10-CM | POA: Diagnosis not present

## 2019-09-26 DIAGNOSIS — M4120 Other idiopathic scoliosis, site unspecified: Secondary | ICD-10-CM | POA: Diagnosis not present

## 2019-10-17 ENCOUNTER — Ambulatory Visit (INDEPENDENT_AMBULATORY_CARE_PROVIDER_SITE_OTHER): Payer: Medicare Other | Admitting: *Deleted

## 2019-10-17 DIAGNOSIS — I639 Cerebral infarction, unspecified: Secondary | ICD-10-CM

## 2019-10-17 LAB — CUP PACEART REMOTE DEVICE CHECK
Date Time Interrogation Session: 20210221235545
Implantable Pulse Generator Implant Date: 20180927

## 2019-10-17 NOTE — Progress Notes (Signed)
ILR Remote 

## 2019-11-17 ENCOUNTER — Ambulatory Visit (INDEPENDENT_AMBULATORY_CARE_PROVIDER_SITE_OTHER): Payer: Medicare Other | Admitting: *Deleted

## 2019-11-17 DIAGNOSIS — I639 Cerebral infarction, unspecified: Secondary | ICD-10-CM

## 2019-11-17 LAB — CUP PACEART REMOTE DEVICE CHECK
Date Time Interrogation Session: 20210325005648
Implantable Pulse Generator Implant Date: 20180927

## 2019-11-17 NOTE — Progress Notes (Signed)
ILR Remote 

## 2019-12-06 DIAGNOSIS — M4120 Other idiopathic scoliosis, site unspecified: Secondary | ICD-10-CM | POA: Diagnosis not present

## 2019-12-06 DIAGNOSIS — M545 Low back pain: Secondary | ICD-10-CM | POA: Diagnosis not present

## 2019-12-07 ENCOUNTER — Telehealth: Payer: Self-pay | Admitting: Neurology

## 2019-12-07 NOTE — Telephone Encounter (Signed)
No contraindications to taking celebrex as far as neurology goes or stroke, that is fine. She can follow up with [place] as needed thanks

## 2019-12-07 NOTE — Telephone Encounter (Signed)
Patient called wanting to discuss medications. Pt states she was advised by Dr.Hopkins to begin taking celebrax   but wanted to verify it was okay to take with neurologist

## 2019-12-07 NOTE — Telephone Encounter (Addendum)
Spoke with Sarah Valdez & gave her Dr Cathren Laine message regarding the Celebrex and that she can follow-up as needed. Sarah Valdez asked about taking Aleve also and I advised that she discuss this with whomever is prescribing the Celebrex since they are both anti-inflammatories. Sarah Valdez verbalized understanding and appreciation.

## 2019-12-19 DIAGNOSIS — H353122 Nonexudative age-related macular degeneration, left eye, intermediate dry stage: Secondary | ICD-10-CM | POA: Diagnosis not present

## 2019-12-19 DIAGNOSIS — H43813 Vitreous degeneration, bilateral: Secondary | ICD-10-CM | POA: Diagnosis not present

## 2019-12-19 DIAGNOSIS — H524 Presbyopia: Secondary | ICD-10-CM | POA: Diagnosis not present

## 2019-12-19 DIAGNOSIS — H52223 Regular astigmatism, bilateral: Secondary | ICD-10-CM | POA: Diagnosis not present

## 2019-12-19 DIAGNOSIS — H5203 Hypermetropia, bilateral: Secondary | ICD-10-CM | POA: Diagnosis not present

## 2019-12-19 DIAGNOSIS — H04122 Dry eye syndrome of left lacrimal gland: Secondary | ICD-10-CM | POA: Diagnosis not present

## 2019-12-19 DIAGNOSIS — H25813 Combined forms of age-related cataract, bilateral: Secondary | ICD-10-CM | POA: Diagnosis not present

## 2019-12-19 LAB — CUP PACEART REMOTE DEVICE CHECK
Date Time Interrogation Session: 20210425010411
Implantable Pulse Generator Implant Date: 20180927

## 2019-12-20 ENCOUNTER — Ambulatory Visit (INDEPENDENT_AMBULATORY_CARE_PROVIDER_SITE_OTHER): Payer: Medicare Other | Admitting: *Deleted

## 2019-12-20 DIAGNOSIS — I639 Cerebral infarction, unspecified: Secondary | ICD-10-CM

## 2019-12-21 NOTE — Progress Notes (Signed)
ILR Remote 

## 2020-01-18 ENCOUNTER — Ambulatory Visit (INDEPENDENT_AMBULATORY_CARE_PROVIDER_SITE_OTHER): Payer: Medicare Other | Admitting: *Deleted

## 2020-01-18 DIAGNOSIS — I639 Cerebral infarction, unspecified: Secondary | ICD-10-CM | POA: Diagnosis not present

## 2020-01-18 LAB — CUP PACEART REMOTE DEVICE CHECK
Date Time Interrogation Session: 20210526012945
Implantable Pulse Generator Implant Date: 20180927

## 2020-01-18 NOTE — Progress Notes (Signed)
Carelink Summary Report / Loop Recorder 

## 2020-02-20 ENCOUNTER — Ambulatory Visit (INDEPENDENT_AMBULATORY_CARE_PROVIDER_SITE_OTHER): Payer: Medicare Other | Admitting: *Deleted

## 2020-02-20 DIAGNOSIS — I639 Cerebral infarction, unspecified: Secondary | ICD-10-CM | POA: Diagnosis not present

## 2020-02-20 LAB — CUP PACEART REMOTE DEVICE CHECK
Date Time Interrogation Session: 20210628024726
Implantable Pulse Generator Implant Date: 20180927

## 2020-02-21 NOTE — Progress Notes (Signed)
Carelink Summary Report / Loop Recorder 

## 2020-03-12 ENCOUNTER — Other Ambulatory Visit: Payer: Self-pay

## 2020-03-12 MED ORDER — DILTIAZEM HCL 120 MG PO TABS: 120.0000 mg | ORAL_TABLET | Freq: Every day | ORAL | 1 refills | Status: DC

## 2020-03-12 NOTE — Telephone Encounter (Signed)
Pt's medication was sent to pt's pharmacy as requested. Confirmation received.  °

## 2020-03-15 DIAGNOSIS — M545 Low back pain: Secondary | ICD-10-CM | POA: Diagnosis not present

## 2020-03-15 DIAGNOSIS — M4316 Spondylolisthesis, lumbar region: Secondary | ICD-10-CM | POA: Diagnosis not present

## 2020-03-26 ENCOUNTER — Ambulatory Visit (INDEPENDENT_AMBULATORY_CARE_PROVIDER_SITE_OTHER): Payer: Medicare Other | Admitting: *Deleted

## 2020-03-26 DIAGNOSIS — I639 Cerebral infarction, unspecified: Secondary | ICD-10-CM

## 2020-03-26 LAB — CUP PACEART REMOTE DEVICE CHECK
Date Time Interrogation Session: 20210731025134
Implantable Pulse Generator Implant Date: 20180927

## 2020-03-28 NOTE — Progress Notes (Signed)
Carelink Summary Report / Loop Recorder 

## 2020-04-29 LAB — CUP PACEART REMOTE DEVICE CHECK
Date Time Interrogation Session: 20210902025147
Implantable Pulse Generator Implant Date: 20180927

## 2020-05-01 ENCOUNTER — Ambulatory Visit (INDEPENDENT_AMBULATORY_CARE_PROVIDER_SITE_OTHER): Payer: Medicare Other | Admitting: *Deleted

## 2020-05-01 DIAGNOSIS — I639 Cerebral infarction, unspecified: Secondary | ICD-10-CM

## 2020-05-02 NOTE — Progress Notes (Signed)
Carelink Summary Report / Loop Recorder 

## 2020-05-03 DIAGNOSIS — M47816 Spondylosis without myelopathy or radiculopathy, lumbar region: Secondary | ICD-10-CM | POA: Diagnosis not present

## 2020-05-15 DIAGNOSIS — Z23 Encounter for immunization: Secondary | ICD-10-CM | POA: Diagnosis not present

## 2020-05-28 DIAGNOSIS — R0981 Nasal congestion: Secondary | ICD-10-CM | POA: Diagnosis not present

## 2020-05-28 DIAGNOSIS — J029 Acute pharyngitis, unspecified: Secondary | ICD-10-CM | POA: Diagnosis not present

## 2020-05-28 DIAGNOSIS — Z1152 Encounter for screening for COVID-19: Secondary | ICD-10-CM | POA: Diagnosis not present

## 2020-05-28 DIAGNOSIS — J019 Acute sinusitis, unspecified: Secondary | ICD-10-CM | POA: Diagnosis not present

## 2020-05-30 DIAGNOSIS — M47816 Spondylosis without myelopathy or radiculopathy, lumbar region: Secondary | ICD-10-CM | POA: Diagnosis not present

## 2020-05-30 LAB — CUP PACEART REMOTE DEVICE CHECK
Date Time Interrogation Session: 20211005025706
Implantable Pulse Generator Implant Date: 20180927

## 2020-06-02 DIAGNOSIS — Z23 Encounter for immunization: Secondary | ICD-10-CM | POA: Diagnosis not present

## 2020-06-04 ENCOUNTER — Ambulatory Visit (INDEPENDENT_AMBULATORY_CARE_PROVIDER_SITE_OTHER): Payer: Medicare Other

## 2020-06-04 DIAGNOSIS — I639 Cerebral infarction, unspecified: Secondary | ICD-10-CM

## 2020-06-05 NOTE — Progress Notes (Signed)
Carelink Summary Report / Loop Recorder 

## 2020-07-01 LAB — CUP PACEART REMOTE DEVICE CHECK
Date Time Interrogation Session: 20211107020740
Implantable Pulse Generator Implant Date: 20180927

## 2020-07-06 DIAGNOSIS — L570 Actinic keratosis: Secondary | ICD-10-CM | POA: Diagnosis not present

## 2020-07-06 DIAGNOSIS — L738 Other specified follicular disorders: Secondary | ICD-10-CM | POA: Diagnosis not present

## 2020-07-09 ENCOUNTER — Ambulatory Visit (INDEPENDENT_AMBULATORY_CARE_PROVIDER_SITE_OTHER): Payer: Medicare Other

## 2020-07-09 DIAGNOSIS — I639 Cerebral infarction, unspecified: Secondary | ICD-10-CM

## 2020-07-09 NOTE — Progress Notes (Signed)
Carelink Summary Report / Loop Recorder 

## 2020-07-31 DIAGNOSIS — M47816 Spondylosis without myelopathy or radiculopathy, lumbar region: Secondary | ICD-10-CM | POA: Diagnosis not present

## 2020-08-13 ENCOUNTER — Ambulatory Visit (INDEPENDENT_AMBULATORY_CARE_PROVIDER_SITE_OTHER): Payer: Medicare Other

## 2020-08-13 DIAGNOSIS — I639 Cerebral infarction, unspecified: Secondary | ICD-10-CM | POA: Diagnosis not present

## 2020-08-13 LAB — CUP PACEART REMOTE DEVICE CHECK
Date Time Interrogation Session: 20211218232722
Implantable Pulse Generator Implant Date: 20180927

## 2020-08-23 DIAGNOSIS — M47816 Spondylosis without myelopathy or radiculopathy, lumbar region: Secondary | ICD-10-CM | POA: Diagnosis not present

## 2020-08-27 NOTE — Progress Notes (Signed)
Carelink Summary Report / Loop Recorder 

## 2020-08-28 ENCOUNTER — Other Ambulatory Visit: Payer: Self-pay | Admitting: Internal Medicine

## 2020-08-28 DIAGNOSIS — Z1231 Encounter for screening mammogram for malignant neoplasm of breast: Secondary | ICD-10-CM

## 2020-09-05 ENCOUNTER — Encounter: Payer: Medicare Other | Admitting: Internal Medicine

## 2020-09-06 ENCOUNTER — Other Ambulatory Visit: Payer: Self-pay | Admitting: Internal Medicine

## 2020-09-14 LAB — CUP PACEART REMOTE DEVICE CHECK
Date Time Interrogation Session: 20220121005523
Implantable Pulse Generator Implant Date: 20180927

## 2020-09-17 ENCOUNTER — Ambulatory Visit (INDEPENDENT_AMBULATORY_CARE_PROVIDER_SITE_OTHER): Payer: Medicare Other

## 2020-09-17 DIAGNOSIS — M47816 Spondylosis without myelopathy or radiculopathy, lumbar region: Secondary | ICD-10-CM | POA: Diagnosis not present

## 2020-09-17 DIAGNOSIS — I639 Cerebral infarction, unspecified: Secondary | ICD-10-CM

## 2020-09-18 DIAGNOSIS — E785 Hyperlipidemia, unspecified: Secondary | ICD-10-CM | POA: Diagnosis not present

## 2020-09-18 DIAGNOSIS — E559 Vitamin D deficiency, unspecified: Secondary | ICD-10-CM | POA: Diagnosis not present

## 2020-09-24 ENCOUNTER — Encounter: Payer: Self-pay | Admitting: Internal Medicine

## 2020-09-24 ENCOUNTER — Other Ambulatory Visit: Payer: Self-pay

## 2020-09-24 ENCOUNTER — Encounter: Payer: Medicare Other | Admitting: Internal Medicine

## 2020-09-24 ENCOUNTER — Ambulatory Visit (INDEPENDENT_AMBULATORY_CARE_PROVIDER_SITE_OTHER): Payer: Medicare Other | Admitting: Internal Medicine

## 2020-09-24 VITALS — BP 112/74 | HR 71 | Ht 67.5 in | Wt 172.2 lb

## 2020-09-24 DIAGNOSIS — I639 Cerebral infarction, unspecified: Secondary | ICD-10-CM

## 2020-09-24 DIAGNOSIS — I471 Supraventricular tachycardia: Secondary | ICD-10-CM | POA: Diagnosis not present

## 2020-09-24 LAB — CUP PACEART INCLINIC DEVICE CHECK
Date Time Interrogation Session: 20220131091149
Implantable Pulse Generator Implant Date: 20180927

## 2020-09-24 NOTE — Patient Instructions (Signed)
Medication Instructions:  Your physician recommends that you continue on your current medications as directed. Please refer to the Current Medication list given to you today.  *If you need a refill on your cardiac medications before your next appointment, please call your pharmacy*   Lab Work: None ordered If you have labs (blood work) drawn today and your tests are completely normal, you will receive your results only by: Marland Kitchen MyChart Message (if you have MyChart) OR . A paper copy in the mail If you have any lab test that is abnormal or we need to change your treatment, we will call you to review the results.   Testing/Procedures: None ordered   Follow-Up: At [facility], you and your health needs are our priority.  As part of our continuing mission to provide you with exceptional heart care, we have created designated Provider Care Teams.  These Care Teams include your primary Cardiologist (physician) and Advanced Practice Providers (APPs -  Physician Assistants and Nurse Practitioners) who all work together to provide you with the care you need, when you need it.  We recommend signing up for the patient portal called "MyChart".  Sign up information is provided on this After Visit Summary.  MyChart is used to connect with patients for Virtual Visits (Telemedicine).  Patients are able to view lab/test results, encounter notes, upcoming appointments, etc.  Non-urgent messages can be sent to your provider as well.   To learn more about what you can do with MyChart, go to [email or web address].    Your next appointment:   12 month(s)  The format for your next appointment:   In Person  Provider:   Thompson Grayer, MD    Thank you for choosing [facility]!!     Other Instructions

## 2020-09-24 NOTE — Progress Notes (Signed)
PCP: Shon Baton, MD   Primary EP: Dr Rayann Heman  Sarah Valdez is a 75 y.o. female who presents today for routine electrophysiology followup.  Since last being seen in our clinic, the patient reports doing very well.  Today, she denies symptoms of palpitations, chest pain, shortness of breath,  lower extremity edema, dizziness, presyncope, or syncope.  The patient is otherwise without complaint today.   Past Medical History:  Diagnosis Date  . Allergy   . Bilateral sensorineural hearing loss 11/06/2016  . Cancer ([place])    skin cancer - on back, forehead  . Chronic congestive heart failure ([place]) 02/22/2018  . Cryptogenic stroke [facility])    followed by Dr Jaynee Eagles  . Diverticulosis of colon 2007   Dr Olevia Perches  . Gallstones 01/20/2014   12/2013 multiple gallstones, largest 1.7 cm in diameter; asymptomatic   . History of basal cell carcinoma excision    X3  . History of colon polyps    NON-CANCEROUS  . History of kidney stones   . Hyperlipidemia   . OA (osteoarthritis of spine)   . Palpitations 11/24/2007   Qualifier: Diagnosis of  By: Linna Darner MD, William    . Pancreatic cyst 01/20/2014   01/03/14 8 x 11 mm nonaggressive cystic lesion the pancreas. Followup recommended in one year.   Marland Kitchen PONV (postoperative nausea and vomiting)   . PSVT (paroxysmal supraventricular tachycardia) (HCC)    adenosine sensitive SVT previously documented  . Right ureteral stone   . Urgency of urination   . Wears glasses    Past Surgical History:  Procedure Laterality Date  . APPENDECTOMY  1950  . 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 CLOSED MANIPULATION POST TOTAL  KNEE ARTHROPLASTY  07-13-2006  . COLONOSCOPY W/ POLYPECTOMY  2002     adenomatous; Dr Olevia Perches  . epidural steroid injections     two rounds of injections; lumbar spine   . EXTRACORPOREAL SHOCK WAVE LITHOTRIPSY  X3  . FACELIFT W/BLEPHAROPLASTY  2005  . HYSTEROSCOPY WITH D & C   12-22-2000  . LOOP RECORDER INSERTION N/A 05/21/2017   Procedure: LOOP RECORDER INSERTION;  Surgeon: Thompson Grayer, MD;  Location: [facility];  Service: Cardiovascular;  Laterality: N/A;  . OPEN KNEE MENISECTOMY  1964  . RIGHT URETEROSCOPIC STONE EXTRACTION / STENT PLACEMENT  08-21-2008  . rotator cuff on right shoulder    . TONSILLECTOMY  1950  . TOTAL KNEE ARTHROPLASTY Right 05-30-2006  . TUBAL LIGATION    . WISDOM TOOTH EXTRACTION      ROS- all systems are reviewed and negatives except as per HPI above  Current Outpatient Medications  Medication Sig Dispense Refill  . alendronate (FOSAMAX) 70 MG tablet Take 70 mg by mouth once a week. Thursdays    . aspirin 325 MG tablet Take 325 mg by mouth at bedtime.    . cetirizine (ZYRTEC) 10 MG tablet Take 10 mg by mouth daily.    . Cholecalciferol (VITAMIN D) 2000 units CAPS Take 1 capsule at bedtime by mouth.     . diltiazem (CARDIZEM) 120 MG tablet Take 1 tablet (120 mg total) by mouth daily. Please keep upcoming appt in January 2022 with Dr. Rayann Heman before anymore refills. Thank you 90 tablet 0  . pravastatin (PRAVACHOL) 40 MG tablet Take 40 mg by mouth daily.     No current facility-administered medications for this visit.    Physical Exam: Vitals:  09/24/20 0843  BP: 112/74  Pulse: 71  SpO2: 96%  Weight: 172 lb 3.2 oz (78.1 kg)  Height: 5' 7.5" (1.715 m)    GEN- The patient is well appearing, alert and oriented x 3 today.   Head- normocephalic, atraumatic Eyes-  Sclera clear, conjunctiva pink Ears- hearing intact Oropharynx- clear Lungs- Clear to ausculation bilaterally, normal work of breathing Heart- Regular rate and rhythm, no murmurs, rubs or gallops, PMI not laterally displaced GI- soft, NT, ND, + BS Extremities- no clubbing, cyanosis, or edema  Wt Readings from Last 3 Encounters:  09/24/20 172 lb 3.2 oz (78.1 kg)  09/05/19 154 lb 3.2 oz (69.9 kg)  09/07/18 171 lb (77.6 kg)    EKG tracing ordered today is  personally reviewed and shows sinus rhythm  Assessment and Plan:  1. Cryptogenic stroke No afib by ILR Overdue to see Dr Jaynee Eagles Could potentially reduce ASA to 81mg  daily.  I will defer.  2. SVT Well controlled  3. Asymptomatic sinus bradycardia Resolved No further workup plannecd  4. HL Continue statin Lipids followed by Dr Virgina Jock  Risks, benefits and potential toxicities for medications prescribed and/or refilled reviewed with patient today.   Return to see EP PA annually I will see when needed or when ILR is at [place] MD, [facility] 09/24/2020 9:04 AM

## 2020-09-25 DIAGNOSIS — Z0000 Encounter for general adult medical examination without abnormal findings: Secondary | ICD-10-CM | POA: Diagnosis not present

## 2020-09-25 DIAGNOSIS — M810 Age-related osteoporosis without current pathological fracture: Secondary | ICD-10-CM | POA: Diagnosis not present

## 2020-09-25 DIAGNOSIS — R413 Other amnesia: Secondary | ICD-10-CM | POA: Diagnosis not present

## 2020-09-25 DIAGNOSIS — I8390 Asymptomatic varicose veins of unspecified lower extremity: Secondary | ICD-10-CM | POA: Diagnosis not present

## 2020-09-25 DIAGNOSIS — K760 Fatty (change of) liver, not elsewhere classified: Secondary | ICD-10-CM | POA: Diagnosis not present

## 2020-09-25 DIAGNOSIS — R351 Nocturia: Secondary | ICD-10-CM | POA: Diagnosis not present

## 2020-09-25 DIAGNOSIS — R82998 Other abnormal findings in urine: Secondary | ICD-10-CM | POA: Diagnosis not present

## 2020-09-25 DIAGNOSIS — N814 Uterovaginal prolapse, unspecified: Secondary | ICD-10-CM | POA: Diagnosis not present

## 2020-09-25 DIAGNOSIS — D692 Other nonthrombocytopenic purpura: Secondary | ICD-10-CM | POA: Diagnosis not present

## 2020-09-25 DIAGNOSIS — Z95818 Presence of other cardiac implants and grafts: Secondary | ICD-10-CM | POA: Diagnosis not present

## 2020-09-25 DIAGNOSIS — I6990 Unspecified sequelae of unspecified cerebrovascular disease: Secondary | ICD-10-CM | POA: Diagnosis not present

## 2020-09-25 DIAGNOSIS — E559 Vitamin D deficiency, unspecified: Secondary | ICD-10-CM | POA: Diagnosis not present

## 2020-09-25 DIAGNOSIS — E785 Hyperlipidemia, unspecified: Secondary | ICD-10-CM | POA: Diagnosis not present

## 2020-09-27 NOTE — Progress Notes (Signed)
Carelink Summary Report / Loop Recorder 

## 2020-10-01 DIAGNOSIS — M47816 Spondylosis without myelopathy or radiculopathy, lumbar region: Secondary | ICD-10-CM | POA: Diagnosis not present

## 2020-10-03 ENCOUNTER — Other Ambulatory Visit: Payer: Self-pay

## 2020-10-03 ENCOUNTER — Telehealth: Payer: Self-pay | Admitting: Neurology

## 2020-10-03 ENCOUNTER — Ambulatory Visit
Admission: RE | Admit: 2020-10-03 | Discharge: 2020-10-03 | Disposition: A | Discharge status: Home / Self Care | Payer: Medicare Other | Source: Ambulatory Visit | Attending: Internal Medicine | Admitting: Internal Medicine

## 2020-10-03 DIAGNOSIS — Z1231 Encounter for screening mammogram for malignant neoplasm of breast: Secondary | ICD-10-CM | POA: Diagnosis not present

## 2020-10-03 NOTE — Telephone Encounter (Signed)
1 yr f/u (pt was asked if she has new symptoms, she mentioned concerns about her memory.  pt was told we are referral based & that she can only be seen for what is mentioned in referral, pt told it would be noted she was told this & that a message would be sent to RN to call if she has questions or concerns).

## 2020-10-10 ENCOUNTER — Telehealth: Payer: Self-pay | Admitting: Internal Medicine

## 2020-10-10 DIAGNOSIS — E785 Hyperlipidemia, unspecified: Secondary | ICD-10-CM | POA: Diagnosis not present

## 2020-10-10 DIAGNOSIS — I6990 Unspecified sequelae of unspecified cerebrovascular disease: Secondary | ICD-10-CM | POA: Diagnosis not present

## 2020-10-10 DIAGNOSIS — R030 Elevated blood-pressure reading, without diagnosis of hypertension: Secondary | ICD-10-CM | POA: Diagnosis not present

## 2020-10-10 DIAGNOSIS — Z95818 Presence of other cardiac implants and grafts: Secondary | ICD-10-CM | POA: Diagnosis not present

## 2020-10-10 DIAGNOSIS — R0600 Dyspnea, unspecified: Secondary | ICD-10-CM | POA: Diagnosis not present

## 2020-10-10 NOTE — Telephone Encounter (Signed)
C/O SOB with exertion since 10/02/20 and has pain in her chest when she coughs. EKG, Chest xray and blood work today with PCP office. Patient feels like blood work was to see if she had a blood clot lungs but not sure. Patient states her PCP told her EKG and Chest xray were good today (can not view those in epic) . Waiting on blood work results. Will call back when she gets blood results. (have PCP fax testing) Patient states her PCP is not sure if she needed more cardiac workup and advised her to reach out to her cardiologist.  ED precautions given.  Will forward to Dr. Rayann Heman and Device clinic.

## 2020-10-10 NOTE — Telephone Encounter (Signed)
Pt c/o Shortness Of Breath: STAT if SOB developed within the last 24 hours or pt is noticeably SOB on the phone  1. Are you currently SOB (can you hear that pt is SOB on the phone)? Yes, cannot hear over the phone   2. How long have you been experiencing SOB? 10/02/20  3. Are you SOB when sitting or when up moving around? Moving around   4. Are you currently experiencing any other symptoms? Coughing and chest tightness  Pt c/o of Chest Pain: STAT if CP now or developed within 24 hours  1. Are you having CP right now? No, occurs with cough   2. Are you experiencing any other symptoms (ex. SOB, nausea, vomiting, sweating)? No   3. How long have you been experiencing CP? 10/02/20  4. Is your CP continuous or coming and going? Only when coughing   5. Have you taken Nitroglycerin? No    Vylet was advised by her PCP to get Dr. Jackalyn Lombard recommendation in regards to this. She states they ran some test today and she will have the results sent to the office. She states it is not chest pain just tightness. Please advise.   ?

## 2020-10-11 ENCOUNTER — Inpatient Hospital Stay (HOSPITAL_COMMUNITY): Payer: Medicare Other

## 2020-10-11 ENCOUNTER — Emergency Department (HOSPITAL_COMMUNITY): Payer: Medicare Other

## 2020-10-11 ENCOUNTER — Encounter (HOSPITAL_COMMUNITY): Payer: Self-pay | Admitting: Emergency Medicine

## 2020-10-11 ENCOUNTER — Ambulatory Visit
Admission: RE | Admit: 2020-10-11 | Discharge: 2020-10-11 | Disposition: A | Discharge status: Home / Self Care | Payer: Medicare Other | Source: Ambulatory Visit | Attending: Adult Health | Admitting: Adult Health

## 2020-10-11 ENCOUNTER — Other Ambulatory Visit: Payer: Self-pay | Admitting: Adult Health

## 2020-10-11 ENCOUNTER — Inpatient Hospital Stay (HOSPITAL_COMMUNITY)
Admission: EM | Admit: 2020-10-11 | Discharge: 2020-10-14 | DRG: 175 | Disposition: A | Discharge status: Home / Self Care | Payer: Medicare Other | Attending: Internal Medicine | Admitting: Internal Medicine

## 2020-10-11 DIAGNOSIS — Z7982 Long term (current) use of aspirin: Secondary | ICD-10-CM | POA: Diagnosis not present

## 2020-10-11 DIAGNOSIS — R0602 Shortness of breath: Secondary | ICD-10-CM

## 2020-10-11 DIAGNOSIS — Z8249 Family history of ischemic heart disease and other diseases of the circulatory system: Secondary | ICD-10-CM

## 2020-10-11 DIAGNOSIS — Z96659 Presence of unspecified artificial knee joint: Secondary | ICD-10-CM | POA: Diagnosis present

## 2020-10-11 DIAGNOSIS — Z79899 Other long term (current) drug therapy: Secondary | ICD-10-CM | POA: Diagnosis not present

## 2020-10-11 DIAGNOSIS — R9431 Abnormal electrocardiogram [ECG] [EKG]: Secondary | ICD-10-CM | POA: Diagnosis not present

## 2020-10-11 DIAGNOSIS — Z8673 Personal history of transient ischemic attack (TIA), and cerebral infarction without residual deficits: Secondary | ICD-10-CM

## 2020-10-11 DIAGNOSIS — I639 Cerebral infarction, unspecified: Secondary | ICD-10-CM | POA: Diagnosis not present

## 2020-10-11 DIAGNOSIS — Z880 Allergy status to penicillin: Secondary | ICD-10-CM | POA: Diagnosis not present

## 2020-10-11 DIAGNOSIS — I82411 Acute embolism and thrombosis of right femoral vein: Secondary | ICD-10-CM | POA: Diagnosis present

## 2020-10-11 DIAGNOSIS — I10 Essential (primary) hypertension: Secondary | ICD-10-CM | POA: Diagnosis not present

## 2020-10-11 DIAGNOSIS — Z881 Allergy status to other antibiotic agents status: Secondary | ICD-10-CM

## 2020-10-11 DIAGNOSIS — Z86010 Personal history of colonic polyps: Secondary | ICD-10-CM | POA: Diagnosis not present

## 2020-10-11 DIAGNOSIS — R0600 Dyspnea, unspecified: Secondary | ICD-10-CM | POA: Diagnosis not present

## 2020-10-11 DIAGNOSIS — Z8371 Family history of colonic polyps: Secondary | ICD-10-CM | POA: Diagnosis not present

## 2020-10-11 DIAGNOSIS — Z85828 Personal history of other malignant neoplasm of skin: Secondary | ICD-10-CM

## 2020-10-11 DIAGNOSIS — I82431 Acute embolism and thrombosis of right popliteal vein: Secondary | ICD-10-CM | POA: Diagnosis present

## 2020-10-11 DIAGNOSIS — M5134 Other intervertebral disc degeneration, thoracic region: Secondary | ICD-10-CM | POA: Diagnosis not present

## 2020-10-11 DIAGNOSIS — Z20822 Contact with and (suspected) exposure to covid-19: Secondary | ICD-10-CM | POA: Diagnosis present

## 2020-10-11 DIAGNOSIS — I2699 Other pulmonary embolism without acute cor pulmonale: Secondary | ICD-10-CM

## 2020-10-11 DIAGNOSIS — I5032 Chronic diastolic (congestive) heart failure: Secondary | ICD-10-CM | POA: Diagnosis present

## 2020-10-11 DIAGNOSIS — M19011 Primary osteoarthritis, right shoulder: Secondary | ICD-10-CM | POA: Diagnosis not present

## 2020-10-11 DIAGNOSIS — I2609 Other pulmonary embolism with acute cor pulmonale: Secondary | ICD-10-CM | POA: Diagnosis present

## 2020-10-11 DIAGNOSIS — M79604 Pain in right leg: Secondary | ICD-10-CM | POA: Diagnosis not present

## 2020-10-11 DIAGNOSIS — Z87442 Personal history of urinary calculi: Secondary | ICD-10-CM | POA: Diagnosis not present

## 2020-10-11 DIAGNOSIS — I2602 Saddle embolus of pulmonary artery with acute cor pulmonale: Secondary | ICD-10-CM | POA: Diagnosis not present

## 2020-10-11 DIAGNOSIS — E785 Hyperlipidemia, unspecified: Secondary | ICD-10-CM | POA: Diagnosis present

## 2020-10-11 DIAGNOSIS — Z885 Allergy status to narcotic agent status: Secondary | ICD-10-CM

## 2020-10-11 DIAGNOSIS — M19012 Primary osteoarthritis, left shoulder: Secondary | ICD-10-CM | POA: Diagnosis not present

## 2020-10-11 DIAGNOSIS — I110 Hypertensive heart disease with heart failure: Secondary | ICD-10-CM | POA: Diagnosis present

## 2020-10-11 DIAGNOSIS — I82461 Acute embolism and thrombosis of right calf muscular vein: Secondary | ICD-10-CM | POA: Diagnosis present

## 2020-10-11 DIAGNOSIS — I509 Heart failure, unspecified: Secondary | ICD-10-CM

## 2020-10-11 LAB — BRAIN NATRIURETIC PEPTIDE: B Natriuretic Peptide: 76.5 pg/mL (ref 0.0–100.0)

## 2020-10-11 LAB — TROPONIN I (HIGH SENSITIVITY)
Troponin I (High Sensitivity): 7 ng/L (ref <18)
Troponin I (High Sensitivity): 6 ng/L (ref <18)

## 2020-10-11 LAB — COMPREHENSIVE METABOLIC PANEL
ALT: 28 U/L (ref 0–44)
AST: 23 U/L (ref 15–41)
Albumin: 3.8 g/dL (ref 3.5–5.0)
Alkaline Phosphatase: 46 U/L (ref 38–126)
Anion gap: 9 (ref 5–15)
BUN: 22 mg/dL (ref 8–23)
CO2: 20 mmol/L — ABNORMAL LOW (ref 22–32)
Calcium: 8.7 mg/dL — ABNORMAL LOW (ref 8.9–10.3)
Chloride: 110 mmol/L (ref 98–111)
Creatinine, Ser: 0.91 mg/dL (ref 0.44–1.00)
GFR, Estimated: >60 mL/min (ref >60)
Glucose, Bld: 92 mg/dL (ref 70–99)
Potassium: 3.5 mmol/L (ref 3.5–5.1)
Sodium: 139 mmol/L (ref 135–145)
Total Bilirubin: 0.7 mg/dL (ref 0.3–1.2)
Total Protein: 6.5 g/dL (ref 6.5–8.1)

## 2020-10-11 LAB — RESP PANEL BY RT-PCR (FLU A&B, COVID) ARPGX2
Influenza A by PCR: NEGATIVE
Influenza B by PCR: NEGATIVE
SARS Coronavirus 2 by RT PCR: NEGATIVE

## 2020-10-11 LAB — ECHOCARDIOGRAM LIMITED
Area-P 1/2: 2.54 cm2
Height: 67 in
S' Lateral: 2.6 cm
Weight: 2688 oz

## 2020-10-11 LAB — CBC WITH DIFFERENTIAL/PLATELET
Abs Immature Granulocytes: 0.13 10*3/uL — ABNORMAL HIGH (ref 0.00–0.07)
Basophils Absolute: 0.1 10*3/uL (ref 0.0–0.1)
Basophils Relative: 1 %
Eosinophils Absolute: 0.2 10*3/uL (ref 0.0–0.5)
Eosinophils Relative: 2 %
HCT: 44.6 % (ref 36.0–46.0)
Hemoglobin: 14.4 g/dL (ref 12.0–15.0)
Immature Granulocytes: 1 %
Lymphocytes Relative: 15 %
Lymphs Abs: 1.5 10*3/uL (ref 0.7–4.0)
MCH: 32 pg (ref 26.0–34.0)
MCHC: 32.3 g/dL (ref 30.0–36.0)
MCV: 99.1 fL (ref 80.0–100.0)
Monocytes Absolute: 1 10*3/uL (ref 0.1–1.0)
Monocytes Relative: 10 %
Neutro Abs: 7.2 10*3/uL (ref 1.7–7.7)
Neutrophils Relative %: 71 %
Platelets: 136 10*3/uL — ABNORMAL LOW (ref 150–400)
RBC: 4.5 MIL/uL (ref 3.87–5.11)
RDW: 13.9 % (ref 11.5–15.5)
WBC: 10 10*3/uL (ref 4.0–10.5)
nRBC: 0 % (ref 0.0–0.2)

## 2020-10-11 LAB — HEPARIN LEVEL (UNFRACTIONATED): Heparin Unfractionated: <0.1 IU/mL — ABNORMAL LOW (ref 0.30–0.70)

## 2020-10-11 MED ORDER — ONDANSETRON HCL 4 MG PO TABS: 4.0000 mg | ORAL_TABLET | Freq: Four times a day (QID) | ORAL | Status: DC | PRN

## 2020-10-11 MED ORDER — DILTIAZEM HCL 60 MG PO TABS
120.0000 mg | ORAL_TABLET | Freq: Every day | ORAL | Status: DC
  Administered 2020-10-12 – 2020-10-14 (×3): 120 mg via ORAL
  Filled 2020-10-11 (×4): qty 2

## 2020-10-11 MED ORDER — HEPARIN BOLUS VIA INFUSION
5000.0000 [IU] | Freq: Once | INTRAVENOUS | Status: AC
  Administered 2020-10-11: 5000 [IU] via INTRAVENOUS
  Filled 2020-10-11: qty 5000

## 2020-10-11 MED ORDER — DOCUSATE SODIUM 100 MG PO CAPS
100.0000 mg | ORAL_CAPSULE | Freq: Two times a day (BID) | ORAL | Status: DC
  Administered 2020-10-11 – 2020-10-14 (×6): 100 mg via ORAL
  Filled 2020-10-11 (×6): qty 1

## 2020-10-11 MED ORDER — IOPAMIDOL (ISOVUE-370) INJECTION 76%
75.0000 mL | Freq: Once | INTRAVENOUS | Status: AC | PRN
  Administered 2020-10-11: 75 mL via INTRAVENOUS

## 2020-10-11 MED ORDER — LORATADINE 10 MG PO TABS
10.0000 mg | ORAL_TABLET | Freq: Every day | ORAL | Status: DC
  Administered 2020-10-12 – 2020-10-14 (×3): 10 mg via ORAL
  Filled 2020-10-11 (×3): qty 1

## 2020-10-11 MED ORDER — HEPARIN (PORCINE) 25000 UT/250ML-% IV SOLN
1100.0000 [IU]/h | INTRAVENOUS | Status: DC
  Administered 2020-10-11: 1250 [IU]/h via INTRAVENOUS
  Administered 2020-10-12: 1100 [IU]/h via INTRAVENOUS
  Filled 2020-10-11 (×3): qty 250

## 2020-10-11 MED ORDER — ACETAMINOPHEN 325 MG PO TABS
650.0000 mg | ORAL_TABLET | Freq: Four times a day (QID) | ORAL | Status: DC | PRN
  Administered 2020-10-12: 650 mg via ORAL
  Filled 2020-10-11: qty 2

## 2020-10-11 MED ORDER — BISACODYL 5 MG PO TBEC: 5.0000 mg | DELAYED_RELEASE_TABLET | Freq: Every day | ORAL | Status: DC | PRN

## 2020-10-11 MED ORDER — ACETAMINOPHEN 650 MG RE SUPP: 650.0000 mg | Freq: Four times a day (QID) | RECTAL | Status: DC | PRN

## 2020-10-11 MED ORDER — POLYETHYLENE GLYCOL 3350 17 G PO PACK: 17.0000 g | PACK | Freq: Every day | ORAL | Status: DC | PRN

## 2020-10-11 MED ORDER — PRAVASTATIN SODIUM 40 MG PO TABS
40.0000 mg | ORAL_TABLET | Freq: Every day | ORAL | Status: DC
  Administered 2020-10-12 – 2020-10-14 (×3): 40 mg via ORAL
  Filled 2020-10-11 (×3): qty 1

## 2020-10-11 MED ORDER — ONDANSETRON HCL 4 MG/2ML IJ SOLN: 4.0000 mg | Freq: Four times a day (QID) | INTRAMUSCULAR | Status: DC | PRN

## 2020-10-11 MED ORDER — SODIUM CHLORIDE 0.9% FLUSH
3.0000 mL | Freq: Two times a day (BID) | INTRAVENOUS | Status: DC
  Administered 2020-10-11 – 2020-10-14 (×6): 3 mL via INTRAVENOUS

## 2020-10-11 MED ORDER — HYDRALAZINE HCL 20 MG/ML IJ SOLN: 5.0000 mg | INTRAMUSCULAR | Status: DC | PRN

## 2020-10-11 NOTE — ED Notes (Signed)
Pt taken to [place] on monitor

## 2020-10-11 NOTE — Progress Notes (Signed)
ANTICOAGULATION CONSULT NOTE - Initial Consult  Pharmacy Consult for heparin Indication: pulmonary embolus  Allergies  Allergen Reactions  . Morphine Other (See Comments)    Respiratory compromise post op  . Penicillins Rash    Has patient had a PCN reaction causing immediate rash, facial/tongue/throat swelling, SOB or lightheadedness with hypotension: No Has patient had a PCN reaction causing severe rash involving mucus membranes or skin necrosis: No Has patient had a PCN reaction that required hospitalization: No Has patient had a PCN reaction occurring within the last 10 years: No If all of the above answers are "NO", then may proceed with Cephalosporin use. urticaria  . Levofloxacin Other (See Comments)     SHOULDER PAIN    Patient Measurements: Height: 5\' 7"  (170.2 cm) Weight: 76.2 kg (168 lb) IBW/kg (Calculated) : 61.6 Heparin Dosing Weight: 76.2  Vital Signs: BP: 148/86 (02/17 1341) Pulse Rate: 92 (02/17 1341)  Labs: No results for input(s): HGB, HCT, PLT, APTT, LABPROT, INR, HEPARINUNFRC, HEPRLOWMOCWT, CREATININE, CKTOTAL, CKMB, TROPONINIHS in the last 72 hours.  CrCl cannot be calculated (Patient's most recent lab result is older than the maximum 21 days allowed.).   Medical History: Past Medical History:  Diagnosis Date  . Allergy   . Bilateral sensorineural hearing loss 11/06/2016  . Cancer ([place])    skin cancer - on back, forehead  . Chronic congestive heart failure ([place]) 02/22/2018  . Cryptogenic stroke [facility])    followed by Dr Jaynee Eagles  . Diverticulosis of colon 2007   Dr Olevia Perches  . Gallstones 01/20/2014   12/2013 multiple gallstones, largest 1.7 cm in diameter; asymptomatic   . History of basal cell carcinoma excision    X3  . History of colon polyps    NON-CANCEROUS  . History of kidney stones   . Hyperlipidemia   . OA (osteoarthritis of spine)   . Palpitations 11/24/2007   Qualifier: Diagnosis of  By: Linna Darner MD, William    . Pancreatic cyst 01/20/2014    01/03/14 8 x 11 mm nonaggressive cystic lesion the pancreas. Followup recommended in one year.   Marland Kitchen PONV (postoperative nausea and vomiting)   . PSVT (paroxysmal supraventricular tachycardia) (HCC)    adenosine sensitive SVT previously documented  . Right ureteral stone   . Urgency of urination   . Wears glasses     Medications:  Scheduled:  . heparin  5,000 Units Intravenous Once    Assessment: 75 yo female presenting after found to have a PE.  Patient was experiencing shortness of breath on exertion for the past 9 days.  Outpatient CT today found extensive biltateral PE with RHS (RV/LV ratio 1.4) consistent w/ at least submassive PE.  No AC noted PTA.  Pharmacy consulted to dose heparin.    Goal of Therapy:  Heparin level 0.3-0.7 units/ml Monitor platelets by anticoagulation protocol: Yes   Plan:  Heparin 5000 units bolus x1 Start heparin drip at 1250 units/hr F/u 6 hour HL, CBC Monitor s/sx bleeding, clinical improvement  Dimple Nanas, PharmD PGY-1 Acute Care Pharmacy Resident Office: [phone] 10/11/2020 2:37 PM

## 2020-10-11 NOTE — Progress Notes (Signed)
  Echocardiogram 2D Echocardiogram has been performed.  Sarah Valdez 10/11/2020, 4:54 PM

## 2020-10-11 NOTE — Progress Notes (Signed)
VASCULAR LAB    Right lower extremity venous duplex has been performed.  See CV proc for preliminary results.  Messaged results to Franchot Heidelberg, PA-C  via secure chat.   Hatcher Froning, RVT 10/11/2020, 3:05 PM

## 2020-10-11 NOTE — ED Notes (Signed)
Report has been called to RN on 3E who will call when patient can come once room is cleaned.

## 2020-10-11 NOTE — ED Triage Notes (Signed)
Pt arrives from home via [facility] after receiving a phone call to inform her of bilateral PE after her CT scan that was done today. She has had exertion shortness of breath for 9 days and had an outpatient CT done today. She also has pain behind her right knee. She is alert and oriented on arrival.

## 2020-10-11 NOTE — H&P (Signed)
History and Physical    JAYLEI FUERTE PRF:[id number] DOB: [birth year] DOA: 10/11/2020  PCP: Shon Baton, MD Consultants:  Allred - cardiology; Jaynee Eagles - neurology; Vertell Limber - neurosurgery Patient coming from:  Home - lives with husband; NOK: Marni, [place], [phone]   Chief Complaint: CT + for PE  HPI: Sarah Valdez is a 75 y.o. female with medical history significant of HLD; CVA; and chronic diastolic CHF presenting with SOB, cough, chest pain.  PCP ordered CTA which showed B PE.  She reports that she had radioablation of her spine some time ago.  She might have been less mobile following this although not markedly so.  For the last few weeks following the procedure, she was noticing RLE swelling and mild erythema.  She started having some SOB with ambulation.  When she did too much, she would get SOB and cough with mild pleuritic CP.  This has been going on for weeks and since it was persistent she asked her PCP about it.  She saw him a few days ago and he ordered blood work yesterday.  She had to go back for an additional test today (?D-dimer) and was sent for a CTA which showed extensive B pulmonary emboli with R heart strain and a possible area of pulmonary infarction.  She has no prior h/o VTE but has had 3 cryptogenic strokes and has a loop recorder in place.    ED Course:  B PE, some heart strain on CT.  SOB, pleuritic CP x 1 week and R leg pain. CTA showed B PE and so sent to ER.  Overall stable, on room air.  Heparin to start due to large clot burden.   Review of Systems: As per HPI; otherwise review of systems reviewed and negative.   Ambulatory Status:  Ambulates without assistance  COVID Vaccine Status:  Complete plus booster  Past Medical History:  Diagnosis Date  . Allergy   . Bilateral sensorineural hearing loss 11/06/2016  . Cancer ([place])    skin cancer - on back, forehead  . Chronic congestive heart failure ([place]) 02/22/2018  . Cryptogenic stroke [facility])    followed  by Dr Jaynee Eagles  . Diverticulosis of colon 2007   Dr Olevia Perches  . Gallstones 01/20/2014   12/2013 multiple gallstones, largest 1.7 cm in diameter; asymptomatic   . History of basal cell carcinoma excision    X3  . History of colon polyps    NON-CANCEROUS  . History of kidney stones   . Hyperlipidemia   . OA (osteoarthritis of spine)   . Palpitations 11/24/2007   Qualifier: Diagnosis of  By: Linna Darner MD, William    . Pancreatic cyst 01/20/2014   01/03/14 8 x 11 mm nonaggressive cystic lesion the pancreas. Followup recommended in one year.   Marland Kitchen PONV (postoperative nausea and vomiting)   . PSVT (paroxysmal supraventricular tachycardia) (HCC)    adenosine sensitive SVT previously documented  . Right ureteral stone   . Urgency of urination   . Wears glasses     Past Surgical History:  Procedure Laterality Date  . APPENDECTOMY  1950  . 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 CLOSED MANIPULATION POST TOTAL  KNEE ARTHROPLASTY  07-13-2006  . COLONOSCOPY W/ POLYPECTOMY  2002     adenomatous; Dr Olevia Perches  . epidural steroid injections     two rounds of injections; lumbar spine   . EXTRACORPOREAL SHOCK WAVE LITHOTRIPSY  X3  . FACELIFT W/BLEPHAROPLASTY  2005  . HYSTEROSCOPY WITH D & C  12-22-2000  . LOOP RECORDER INSERTION N/A 05/21/2017   Procedure: LOOP RECORDER INSERTION;  Surgeon: Thompson Grayer, MD;  Location: [place] CV LAB;  Service: Cardiovascular;  Laterality: N/A;  . OPEN KNEE MENISECTOMY  1964  . RIGHT URETEROSCOPIC STONE EXTRACTION / STENT PLACEMENT  08-21-2008  . rotator cuff on right shoulder    . TONSILLECTOMY  1950  . TOTAL KNEE ARTHROPLASTY Right 05-30-2006  . TUBAL LIGATION    . WISDOM TOOTH EXTRACTION      Social History   Socioeconomic History  . Marital status: Married    Spouse name: Not on file  . Number of children: 2  . Years of education: Not on file  . Highest education level: Bachelor's  degree (e.g., BA, AB, BS)  Occupational History  . Not on file  Tobacco Use  . Smoking status: Never Smoker  . Smokeless tobacco: Never Used  Vaping Use  . Vaping Use: Never used  Substance and Sexual Activity  . Alcohol use: Yes    Alcohol/week: 5.0 standard drinks    Types: 5 Glasses of wine per week  . Drug use: No  . Sexual activity: Not on file  Other Topics Concern  . Not on file  Social History Narrative   Lives at home in [place] with spouse.    Retired Careers information officer.   Active in the community.   Right handed   Drinks 1 cup of caffeine daily   Social Determinants of Health   Financial Resource Strain: Not on file  Food Insecurity: Not on file  Transportation Needs: Not on file  Physical Activity: Not on file  Stress: Not on file  Social Connections: Not on file  Intimate Partner Violence: Not on file    Allergies  Allergen Reactions  . Morphine Other (See Comments)    Respiratory compromise post op  . Penicillins Rash    Has patient had a PCN reaction causing immediate rash, facial/tongue/throat swelling, SOB or lightheadedness with hypotension: No Has patient had a PCN reaction causing severe rash involving mucus membranes or skin necrosis: No Has patient had a PCN reaction that required hospitalization: No Has patient had a PCN reaction occurring within the last 10 years: No If all of the above answers are "NO", then may proceed with Cephalosporin use. urticaria  . Levofloxacin Other (See Comments)     SHOULDER PAIN    Family History  Problem Relation Age of Onset  . Heart attack Mother 86  . Emphysema Father        smoker  . Colon polyps Father   . Breast cancer Sister        late 43s  . Cancer Paternal Grandfather        throat cancer  . Esophageal cancer Paternal Grandfather   . Heart attack Maternal Grandfather 76  . Diabetes Neg Hx   . Stroke Neg Hx   . Colon cancer Neg Hx   . Rectal cancer Neg Hx   . Stomach cancer Neg Hx     Prior  to Admission medications   Medication Sig Start Date End Date Taking? Authorizing Provider  alendronate (FOSAMAX) 70 MG tablet Take 70 mg by mouth once a week. Thursdays 01/19/17   [provider]  aspirin 325 MG tablet Take 325 mg by mouth at bedtime.    [provider]  cetirizine (ZYRTEC) 10 MG tablet Take 10 mg  by mouth daily.    [provider]  Cholecalciferol (VITAMIN D) 2000 units CAPS Take 1 capsule at bedtime by mouth.     [provider]  diltiazem (CARDIZEM) 120 MG tablet Take 1 tablet (120 mg total) by mouth daily. Please keep upcoming appt in January 2022 with Dr. Rayann Heman before anymore refills. Thank you 09/06/20   Thompson Grayer, MD  pravastatin (PRAVACHOL) 40 MG tablet Take 40 mg by mouth daily.    [provider]    Physical Exam: Vitals:   10/11/20 1523 10/11/20 1525 10/11/20 1530 10/11/20 1545  BP: 137/76  136/74 135/79  Pulse: 77  76 68  Resp: 16  17 19   Temp:  98.4 F (36.9 C)    TempSrc:  Oral    SpO2: 96%  98% 97%  Weight:      Height:         . General:  Appears calm and comfortable and is in NAD . Eyes:  PERRL, EOMI, normal lids, iris . ENT:  grossly normal hearing, lips & tongue, mmm; appropriate dentition . Neck:  no LAD, masses or thyromegaly . Cardiovascular:  RRR, no m/r/g.  . Respiratory:   CTA bilaterally with no wheezes/rales/rhonchi.  Normal respiratory effort. . Abdomen:  soft, NT, ND, NABS . Skin:  RLE is larger and slightly more erythematous than the left, no obvious palpable cords      . Musculoskeletal:  grossly normal tone BUE/BLE, good ROM, no bony abnormality . Psychiatric:  grossly normal mood and affect, speech fluent and appropriate, AOx3 . Neurologic:  CN 2-12 grossly intact, moves all extremities in coordinated fashion    Radiological Exams on Admission: Independently reviewed - see discussion in A/P where applicable  CT ANGIO CHEST PE W OR WO CONTRAST  Result Date:  10/11/2020 CLINICAL DATA:  Shortness of breath, elevated D-dimer EXAM: CT ANGIOGRAPHY CHEST WITH CONTRAST TECHNIQUE: Multidetector CT imaging of the chest was performed using the standard protocol during bolus administration of intravenous contrast. Multiplanar CT image reconstructions and MIPs were obtained to evaluate the vascular anatomy. CONTRAST:  34mL ISOVUE-370 IOPAMIDOL (ISOVUE-370) INJECTION 76% COMPARISON:  Chest x-ray 11/12/2009 FINDINGS: Cardiovascular: Satisfactory opacification of the pulmonary arteries. Extensive bilateral pulmonary emboli with thrombus present within both the right and left main pulmonary artery with extension into the lobar branches and multiple segmental branches. No saddle embolism. RV to LV ratio of 1.4 indicative of right heart strain. Heart size is normal. No pericardial effusion. Thoracic aorta is nonaneurysmal. Three vessel arch. Mediastinum/Nodes: No enlarged mediastinal, hilar, or axillary lymph nodes. Thyroid gland, trachea, and esophagus demonstrate no significant findings. Lungs/Pleura: Small wedge-shaped opacity in the periphery of the left upper lobe anteriorly (series 12, image 92) which may reflect an area of pulmonary infarction. Elsewhere, lungs are clear. No pleural effusion or pneumothorax. Upper Abdomen: Suspect cholelithiasis within the partially imaged gallbladder. Musculoskeletal: Lower left chest wall implanted loop recorder. Left worse than right glenohumeral joint osteoarthritis. Advanced degenerative disc disease of T12-L1. No acute osseous findings. Review of the MIP images confirms the above findings. IMPRESSION: 1. Extensive bilateral pulmonary emboli with evidence of right heart strain (RV/LV Ratio 1.4) consistent with at least submassive (intermediate risk) PE. The presence of right heart strain has been associated with an increased risk of morbidity and mortality. 2. Small wedge-shaped opacity in the periphery of the left upper lobe anteriorly may  reflect an area of pulmonary infarction. 3. Suspect cholelithiasis. Critical Value/emergent results were called by telephone at the  time of interpretation on 10/11/2020 at 12:31 pm to provider Dr Shon Baton, who verbally acknowledged these results. Electronically Signed   By: Davina Poke D.O.   On: 10/11/2020 12:32   VAS [place] LOWER EXTREMITY VENOUS (DVT) (ONLY [facility] & [facility] 7a-7p)  Result Date: 10/11/2020  Lower Venous DVT Study Indications: Pain, and pulmonary embolism.  Comparison Study: No prior study on file Performing Technologist: Sharion Dove RVS  Examination Guidelines: A complete evaluation includes B-mode imaging, spectral Doppler, color Doppler, and power Doppler as needed of all accessible portions of each vessel. Bilateral testing is considered an integral part of a complete examination. Limited examinations for reoccurring indications may be performed as noted. The reflux portion of the exam is performed with the patient in reverse Trendelenburg.  +---------+---------------+---------+-----------+----------+--------------+ RIGHT    CompressibilityPhasicitySpontaneityPropertiesThrombus Aging +---------+---------------+---------+-----------+----------+--------------+ CFV      Partial                                      Acute          +---------+---------------+---------+-----------+----------+--------------+ SFJ      Full                                                        +---------+---------------+---------+-----------+----------+--------------+ FV Prox  None                                         Acute          +---------+---------------+---------+-----------+----------+--------------+ FV Mid   None                                         Acute          +---------+---------------+---------+-----------+----------+--------------+ FV DistalNone                                         Acute           +---------+---------------+---------+-----------+----------+--------------+ PFV      Full                                                        +---------+---------------+---------+-----------+----------+--------------+ POP      None           No       No                   Acute          +---------+---------------+---------+-----------+----------+--------------+ PTV      Full                                                        +---------+---------------+---------+-----------+----------+--------------+  PERO     Full                                                        +---------+---------------+---------+-----------+----------+--------------+ Gastroc  None                                         Acute          +---------+---------------+---------+-----------+----------+--------------+   +----+---------------+---------+-----------+----------+--------------+ LEFTCompressibilityPhasicitySpontaneityPropertiesThrombus Aging +----+---------------+---------+-----------+----------+--------------+ CFV Full           Yes      Yes                                 +----+---------------+---------+-----------+----------+--------------+     Summary: RIGHT: - Findings consistent with acute deep vein thrombosis involving the right common femoral vein, right femoral vein, right popliteal vein, and right gastrocnemius veins.   *See table(s) above for measurements and observations.    Preliminary     EKG: Independently reviewed.  NSR with rate 74; no evidence of acute ischemia   Labs on Admission: I have personally reviewed the available labs and imaging studies at the time of the admission.  Pertinent labs:   Unremarkable CMP HS troponin 7 WBC 10 Platelets 136 COVID/flu negative   Assessment/Plan Principal Problem:   Acute pulmonary embolism with acute cor pulmonale (HCC) Active Problems:   Dyslipidemia   Cryptogenic stroke (HCC)   Chronic congestive heart  failure (HCC)   Essential hypertension   RLE DVT with PE with acute cor pulmonale -Patient without prior episodes of thromboembolic disease presenting with new DVT/PE -However she has had multiple cryptogenic strokes in the past -She is on room air and looks remarkably good, but has significant clot burden with R heart strain -Will admit on telemetry  -Given R heart strain on CT, will obtain STAT limited echo, although given her hemodynamic stability she does not appear to need IR consult for thrombectomy at this time -Initiate anticoagulation - for now, will start treatment-dose heparin -She is likely able to transition to alternative AC agent on Saturday (possibly tomorrow, but given the extensive clot burden maybe wait until 2/19) -[place] O2 as needed -We discussed oral AC treatment options and risks/benefits including frequent MD visits and dietary regulation with Coumadin vs. Significant expense with DOAC therapy -Will request TOC team consultation to assist with cost analysis of the various DOAC options based on his insurance -The patient understands that thromboembolic disease can be catastrophic and even deadly and that she must be complaint with physician appointments and lifelong anticoagulation.  H/o cryptogenic strokes -Suspect that the patient has an underlying hypercoagulable state that has not been identified -She is likely to need lifelong anticoagulation, which may also help prevent future strokes -Hold ASA given need for [facility] now  HTN -Continue Cardizem  HLD -Continue Pravastatin  Chronic diastolic CHF -Preserved EF with grade 1 diastolic dysfunction in 3716 during CVA evaluation -Appears to be compensated at this time -Will have more information from echo ordered today soon     Note: This patient has been tested and is negative for the novel coronavirus COVID-19. The patient has been fully vaccinated against COVID-19.  Level of care: Telemetry  CardiacTelemetry DVT prophylaxis: Heparin drip Code Status:  Full - confirmed with patient/family Family Communication: Husband was present throughout evaluation Disposition Plan:  The patient is from: home  Anticipated d/c is to: home without [facility] services  Anticipated d/c date will depend on clinical response to treatment, likely 2-3 days  Patient is currently: acutely ill Consults called: None Admission status:  Admit - It is my clinical opinion that admission to [place] is reasonable and necessary because of the expectation that this patient will require hospital care that crosses at least 2 midnights to treat this condition based on the medical complexity of the problems presented.  Given the aforementioned information, the predictability of an adverse outcome is felt to be significant.    Karmen Bongo MD [facility]   How to contact the [facility] Attending or Consulting provider Between or covering provider during after hours [place], for this patient?  1. Check the care team in [facility] and look for a) attending/consulting [facility] provider listed and b) the [facility] team listed 2. Log into [email or web address] and use [place] universal password to access. If you do not have the password, please contact the hospital operator. 3. Locate the [facility] provider you are looking for under [facility] and page to a number that you can be directly reached. 4. If you still have difficulty reaching the provider, please page the [facility] (Director on Call) for the Hospitalists listed on amion for assistance.   10/11/2020, 4:33 PM

## 2020-10-11 NOTE — Telephone Encounter (Signed)
Spoke with pt as transmission has not yet been received.  Instructed her on how to send transmission.  She attempted to send it during call, handheld device battery needs to be recharged.  She will wait 20 minutes and try again.    Pt reports that her blood work drawn yesterday had to be redrawn today due to an issue at the lab.  She reports she had a BMET drawn this morning.

## 2020-10-11 NOTE — ED Notes (Signed)
Confirmed bed is ready, transported with rn/monitor to floor

## 2020-10-11 NOTE — ED Notes (Signed)
Helped pt on a bedside commode & then back into bed.

## 2020-10-11 NOTE — Telephone Encounter (Signed)
Manual transmission received.  No arrythmias logged.

## 2020-10-11 NOTE — ED Notes (Signed)
Pharmacy called to send heparin

## 2020-10-11 NOTE — ED Provider Notes (Signed)
[facility]ERG[facility] DEPARTMENT Provider Note   CSN: [id number] Arrival date & time: 10/11/20  1331     History Chief Complaint  Patient presents with  . Bilateral PEs  . Shortness of Breath    Sarah Valdez is a 75 y.o. female presenting for evaluation of shortness of breath and outpatient CTA which showed PEs.  Patient states just over 2 weeks ago she had radiofrequency ablation of the back.  She developed mild discomfort and shortness of breath since then.  Follow-up with her PCP, there is concern for possible PE due to abnormal blood work.  CTA outpatient today showed bilateral PE causing heart strain, patient told to come to the ER.  She states currently she feels slightly short of breath.  This is worse with exertion.  Mild chest tightness that she takes deep breath in, but no pain.  She denies fevers, chills, cough, nausea, vomiting, domino pain.  She denies recent travel, immobilization, history of cancer, history of previous DVT/PE, or hormone use.  She is on aspirin, but no other blood thinners.  She has never had a blood clot before.  She does report mild discomfort behind her right knee and in her right calf, does not know when this began.  Additional history obtained from chart review.  I reviewed outpatient CTA imaging which shows extensive bilateral PE with evidence of right heart strain with an RV/LV ratio of 1.4.  Additionally, small wedge-shaped opacity in the periphery of the left upper lobe which may be pulmonary infarction.  HPI     Past Medical History:  Diagnosis Date  . Allergy   . Bilateral sensorineural hearing loss 11/06/2016  . Cancer ([place])    skin cancer - on back, forehead  . Chronic congestive heart failure ([place]) 02/22/2018  . Cryptogenic stroke [facility])    followed by Dr Jaynee Eagles  . Diverticulosis of colon 2007   Dr Olevia Perches  . Gallstones 01/20/2014   12/2013 multiple gallstones, largest 1.7 cm in diameter; asymptomatic   . History of basal  cell carcinoma excision    X3  . History of colon polyps    NON-CANCEROUS  . History of kidney stones   . Hyperlipidemia   . OA (osteoarthritis of spine)   . Palpitations 11/24/2007   Qualifier: Diagnosis of  By: Linna Darner MD, William    . Pancreatic cyst 01/20/2014   01/03/14 8 x 11 mm nonaggressive cystic lesion the pancreas. Followup recommended in one year.   Marland Kitchen PONV (postoperative nausea and vomiting)   . PSVT (paroxysmal supraventricular tachycardia) ([facility])    adenosine sensitive SVT previously documented  . Right ureteral stone   . Urgency of urination   . Wears glasses     Patient Active Problem List   Diagnosis Date Noted  . Acute pulmonary embolism with acute cor pulmonale ([place]) 10/11/2020  . Right ureteral stone   . Chronic congestive heart failure ([place]) 02/22/2018  . Cryptogenic stroke ([place]) 12/27/2016  . Bilateral sensorineural hearing loss 11/06/2016  . Imbalance 11/06/2016  . Hepatic steatosis 01/20/2014  . Gallstones 01/20/2014  . Pancreatic cyst 01/20/2014  . Abnormal CT scan, liver 01/02/2014  . Back spasm 06/10/2013  . Osteopenia 09/20/2012  . COUGH VARIANT ASTHMA 02/11/2010  . Diverticulosis of large intestine 12/25/2009  . Wrist osteonecrosis 12/25/2009  . Vitamin D deficiency 11/28/2008  . NEPHROLITHIASIS, HX OF 11/28/2008  . Dyslipidemia 11/24/2007  . Allergic rhinitis, cause unspecified 11/24/2007  . PALPITATIONS 11/24/2007  . DEGENERATIVE DISC  DISEASE 01/19/2007  . SKIN CANCER, HX OF 01/19/2007  . COLONIC POLYPS, HX OF 01/19/2007    Past Surgical History:  Procedure Laterality Date  . APPENDECTOMY  1950  . 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 CLOSED MANIPULATION POST TOTAL  KNEE ARTHROPLASTY  07-13-2006  . COLONOSCOPY W/ POLYPECTOMY  2002     adenomatous; Dr Olevia Perches  . epidural steroid injections     two rounds of injections; lumbar spine   . EXTRACORPOREAL SHOCK WAVE  LITHOTRIPSY  X3  . FACELIFT W/BLEPHAROPLASTY  2005  . HYSTEROSCOPY WITH D & C  12-22-2000  . LOOP RECORDER INSERTION N/A 05/21/2017   Procedure: LOOP RECORDER INSERTION;  Surgeon: Thompson Grayer, MD;  Location: [facility] CV LAB;  Service: Cardiovascular;  Laterality: N/A;  . OPEN KNEE MENISECTOMY  1964  . RIGHT URETEROSCOPIC STONE EXTRACTION / STENT PLACEMENT  08-21-2008  . rotator cuff on right shoulder    . TONSILLECTOMY  1950  . TOTAL KNEE ARTHROPLASTY Right 05-30-2006  . TUBAL LIGATION    . WISDOM TOOTH EXTRACTION       OB History   No obstetric history on file.     Family History  Problem Relation Age of Onset  . Heart attack Mother 20  . Emphysema Father        smoker  . Colon polyps Father   . Breast cancer Sister        late 25s  . Cancer Paternal Grandfather        throat cancer  . Esophageal cancer Paternal Grandfather   . Heart attack Maternal Grandfather 76  . Diabetes Neg Hx   . Stroke Neg Hx   . Colon cancer Neg Hx   . Rectal cancer Neg Hx   . Stomach cancer Neg Hx     Social History   Tobacco Use  . Smoking status: Never Smoker  . Smokeless tobacco: Never Used  Vaping Use  . Vaping Use: Never used  Substance Use Topics  . Alcohol use: Yes    Alcohol/week: 5.0 standard drinks    Types: 5 Glasses of wine per week  . Drug use: No    Home Medications Prior to Admission medications   Medication Sig Start Date End Date Taking? Authorizing Provider  alendronate (FOSAMAX) 70 MG tablet Take 70 mg by mouth once a week. Thursdays 01/19/17   [provider]  aspirin 325 MG tablet Take 325 mg by mouth at bedtime.    [provider]  cetirizine (ZYRTEC) 10 MG tablet Take 10 mg by mouth daily.    [provider]  Cholecalciferol (VITAMIN D) 2000 units CAPS Take 1 capsule at bedtime by mouth.     [provider]  diltiazem (CARDIZEM) 120 MG tablet Take 1 tablet (120 mg total) by mouth daily. Please keep upcoming appt in  January 2022 with Dr. Rayann Heman before anymore refills. Thank you 09/06/20   Thompson Grayer, MD  pravastatin (PRAVACHOL) 40 MG tablet Take 40 mg by mouth daily.    [provider]    Allergies    Morphine, Penicillins, and Levofloxacin  Review of Systems   Review of Systems  Respiratory: Positive for chest tightness and shortness of breath.   Musculoskeletal: Positive for myalgias.  All other systems reviewed and are negative.   Physical Exam Updated Vital Signs BP 136/74   Pulse 76   Temp 98.4 F (36.9 C) (Oral)  Resp 17   Ht 5\' 7"  (1.702 m)   Wt 76.2 kg   SpO2 98%   BMI 26.31 kg/m   Physical Exam Vitals and nursing note reviewed.  Constitutional:      General: She is not in acute distress.    Appearance: She is well-developed and well-nourished.     Comments: Appears nontoxic  HENT:     Head: Normocephalic and atraumatic.  Eyes:     Extraocular Movements: Extraocular movements intact and EOM normal.     Conjunctiva/sclera: Conjunctivae normal.     Pupils: Pupils are equal, round, and reactive to light.  Cardiovascular:     Rate and Rhythm: Normal rate and regular rhythm.     Pulses: Normal pulses and intact distal pulses.  Pulmonary:     Effort: Pulmonary effort is normal. Tachypnea present. No respiratory distress.     Breath sounds: Normal breath sounds. No wheezing.     Comments: Mildly tachypneic in the mid to upper 20s.  Speaking in full sentences.  Clear lung sounds in all fields.  Sats stable on room air. Abdominal:     General: There is no distension.     Palpations: Abdomen is soft. There is no mass.     Tenderness: There is no abdominal tenderness. There is no guarding or rebound.  Musculoskeletal:        General: Tenderness present. Normal range of motion.     Cervical back: Normal range of motion and neck supple.     Comments: ttp of R calf. Nn erythema or warmth.   Skin:    General: Skin is warm and dry.     Capillary Refill: Capillary  refill takes less than 2 seconds.  Neurological:     Mental Status: She is alert and oriented to person, place, and time.  Psychiatric:        Mood and Affect: Mood and affect normal.     ED Results / Procedures / Treatments   Labs (all labs ordered are listed, but only abnormal results are displayed) Labs Reviewed  CBC WITH DIFFERENTIAL/PLATELET - Abnormal; Notable for the following components:      Result Value   Platelets 136 (*)    Abs Immature Granulocytes 0.13 (*)    All other components within normal limits  COMPREHENSIVE METABOLIC PANEL - Abnormal; Notable for the following components:   CO2 20 (*)    Calcium 8.7 (*)    All other components within normal limits  RESP PANEL BY RT-PCR (FLU A&B, COVID) ARPGX2  BRAIN NATRIURETIC PEPTIDE  HEPARIN LEVEL (UNFRACTIONATED)  TROPONIN I (HIGH SENSITIVITY)  TROPONIN I (HIGH SENSITIVITY)    EKG EKG Interpretation  Date/Time:  Thursday October 11 2020 15:21:44 EST Ventricular Rate:  74 PR Interval:    QRS Duration: 79 QT Interval:  377 QTC Calculation: 419 R Axis:   -10 Text Interpretation: Sinus rhythm Low voltage, precordial leads Consider anterior infarct since last tracing no significant change Confirmed by Malvin Johns [phone]) on 10/11/2020 3:27:35 PM   Radiology CT ANGIO CHEST PE W OR WO CONTRAST  Result Date: 10/11/2020 CLINICAL DATA:  Shortness of breath, elevated D-dimer EXAM: CT ANGIOGRAPHY CHEST WITH CONTRAST TECHNIQUE: Multidetector CT imaging of the chest was performed using the standard protocol during bolus administration of intravenous contrast. Multiplanar CT image reconstructions and MIPs were obtained to evaluate the vascular anatomy. CONTRAST:  79mL ISOVUE-370 IOPAMIDOL (ISOVUE-370) INJECTION 76% COMPARISON:  Chest x-ray 11/12/2009 FINDINGS: Cardiovascular: Satisfactory opacification of the pulmonary arteries.  Extensive bilateral pulmonary emboli with thrombus present within both the right and left main  pulmonary artery with extension into the lobar branches and multiple segmental branches. No saddle embolism. RV to LV ratio of 1.4 indicative of right heart strain. Heart size is normal. No pericardial effusion. Thoracic aorta is nonaneurysmal. Three vessel arch. Mediastinum/Nodes: No enlarged mediastinal, hilar, or axillary lymph nodes. Thyroid gland, trachea, and esophagus demonstrate no significant findings. Lungs/Pleura: Small wedge-shaped opacity in the periphery of the left upper lobe anteriorly (series 12, image 92) which may reflect an area of pulmonary infarction. Elsewhere, lungs are clear. No pleural effusion or pneumothorax. Upper Abdomen: Suspect cholelithiasis within the partially imaged gallbladder. Musculoskeletal: Lower left chest wall implanted loop recorder. Left worse than right glenohumeral joint osteoarthritis. Advanced degenerative disc disease of T12-L1. No acute osseous findings. Review of the MIP images confirms the above findings. IMPRESSION: 1. Extensive bilateral pulmonary emboli with evidence of right heart strain (RV/LV Ratio 1.4) consistent with at least submassive (intermediate risk) PE. The presence of right heart strain has been associated with an increased risk of morbidity and mortality. 2. Small wedge-shaped opacity in the periphery of the left upper lobe anteriorly may reflect an area of pulmonary infarction. 3. Suspect cholelithiasis. Critical Value/emergent results were called by telephone at the time of interpretation on 10/11/2020 at 12:31 pm to provider Dr Shon Baton, who verbally acknowledged these results. Electronically Signed   By: Davina Poke D.O.   On: 10/11/2020 12:32   VAS [place] LOWER EXTREMITY VENOUS (DVT) (ONLY [facility] & [facility] 7a-7p)  Result Date: 10/11/2020  Lower Venous DVT Study Indications: Pain, and pulmonary embolism.  Comparison Study: No prior study on file Performing Technologist: Sharion Dove RVS  Examination Guidelines: A complete evaluation includes  B-mode imaging, spectral Doppler, color Doppler, and power Doppler as needed of all accessible portions of each vessel. Bilateral testing is considered an integral part of a complete examination. Limited examinations for reoccurring indications may be performed as noted. The reflux portion of the exam is performed with the patient in reverse Trendelenburg.  +---------+---------------+---------+-----------+----------+--------------+ RIGHT    CompressibilityPhasicitySpontaneityPropertiesThrombus Aging +---------+---------------+---------+-----------+----------+--------------+ CFV      Partial                                      Acute          +---------+---------------+---------+-----------+----------+--------------+ SFJ      Full                                                        +---------+---------------+---------+-----------+----------+--------------+ FV Prox  None                                         Acute          +---------+---------------+---------+-----------+----------+--------------+ FV Mid   None                                         Acute          +---------+---------------+---------+-----------+----------+--------------+ FV DistalNone  Acute          +---------+---------------+---------+-----------+----------+--------------+ PFV      Full                                                        +---------+---------------+---------+-----------+----------+--------------+ POP      None           No       No                   Acute          +---------+---------------+---------+-----------+----------+--------------+ PTV      Full                                                        +---------+---------------+---------+-----------+----------+--------------+ PERO     Full                                                         +---------+---------------+---------+-----------+----------+--------------+ Gastroc  None                                         Acute          +---------+---------------+---------+-----------+----------+--------------+   +----+---------------+---------+-----------+----------+--------------+ LEFTCompressibilityPhasicitySpontaneityPropertiesThrombus Aging +----+---------------+---------+-----------+----------+--------------+ CFV Full           Yes      Yes                                 +----+---------------+---------+-----------+----------+--------------+     Summary: RIGHT: - Findings consistent with acute deep vein thrombosis involving the right common femoral vein, right femoral vein, right popliteal vein, and right gastrocnemius veins.   *See table(s) above for measurements and observations.    Preliminary     Procedures .Critical Care Performed by: Franchot Heidelberg, PA-C Authorized by: Franchot Heidelberg, PA-C   Critical care provider statement:    Critical care time (minutes):  40   Critical care time was exclusive of:  Separately billable procedures and treating other patients and teaching time   Critical care was necessary to treat or prevent imminent or life-threatening deterioration of the following conditions:  Respiratory failure   Critical care was time spent personally by me on the following activities:  Blood draw for specimens, development of treatment plan with patient or surrogate, evaluation of patient's response to treatment, examination of patient, obtaining history from patient or surrogate, ordering and performing treatments and interventions, ordering and review of laboratory studies, ordering and review of radiographic studies, pulse oximetry, re-evaluation of patient's condition and review of old charts   I assumed direction of critical care for this patient from another provider in my specialty: no     Care discussed with: admitting provider    Comments:     Pt with bilateral PE with heart strain. IV heparin ordered and pt admitted.  Medications Ordered in ED Medications  heparin ADULT infusion 100 units/mL (25000 units/273mL) (1,250 Units/hr Intravenous New Bag/Given 10/11/20 1547)  heparin bolus via infusion 5,000 Units (5,000 Units Intravenous Bolus from Bag 10/11/20 1548)    ED Course  I have reviewed the triage vital signs and the nursing notes.  Pertinent labs & imaging results that were available during my care of the patient were reviewed by me and considered in my medical decision making (see chart for details).    MDM Rules/Calculators/A&P                          Patient presenting for evaluation of shortness of breath in the setting of a new diagnosis of PE.  On exam, patient appears nontoxic.  She is mildly tachypneic, however sats, blood pressure, and heart rate stable.  She is not in respiratory distress.  However as the CTA does show mild heart strain, she will need to be admitted for IV management for being transitioned to oral. heparin ordered. As patient does have pain of the right calf, will obtain ultrasound.  Discussed plan with patient, who is agreeable.  Labs interpreted by me, hemoglobin stable at 14. plts slightly low at 136.  Electrolytes otherwise reassuring.  US shows right lower extremity DVT.  Will call for admission.  Discussed with Dr. Lorin Mercy from triad hospitalist service, patient to be admitted.  Final Clinical Impression(s) / ED Diagnoses Final diagnoses:  Bilateral pulmonary embolism [facility])    Rx / DC Orders ED Discharge Orders    None       Franchot Heidelberg, PA-C 10/11/20 1601    Malvin Johns, MD 10/12/20 1541

## 2020-10-12 DIAGNOSIS — I2609 Other pulmonary embolism with acute cor pulmonale: Secondary | ICD-10-CM | POA: Diagnosis not present

## 2020-10-12 DIAGNOSIS — I639 Cerebral infarction, unspecified: Secondary | ICD-10-CM | POA: Diagnosis not present

## 2020-10-12 DIAGNOSIS — I10 Essential (primary) hypertension: Secondary | ICD-10-CM | POA: Diagnosis not present

## 2020-10-12 DIAGNOSIS — I5032 Chronic diastolic (congestive) heart failure: Secondary | ICD-10-CM

## 2020-10-12 LAB — BASIC METABOLIC PANEL
Anion gap: 10 (ref 5–15)
BUN: 24 mg/dL — ABNORMAL HIGH (ref 8–23)
CO2: 20 mmol/L — ABNORMAL LOW (ref 22–32)
Calcium: 8.4 mg/dL — ABNORMAL LOW (ref 8.9–10.3)
Chloride: 109 mmol/L (ref 98–111)
Creatinine, Ser: 0.91 mg/dL (ref 0.44–1.00)
GFR, Estimated: >60 mL/min (ref >60)
Glucose, Bld: 103 mg/dL — ABNORMAL HIGH (ref 70–99)
Potassium: 3.6 mmol/L (ref 3.5–5.1)
Sodium: 139 mmol/L (ref 135–145)

## 2020-10-12 LAB — CBC
HCT: 39.1 % (ref 36.0–46.0)
Hemoglobin: 13.5 g/dL (ref 12.0–15.0)
MCH: 32.9 pg (ref 26.0–34.0)
MCHC: 34.5 g/dL (ref 30.0–36.0)
MCV: 95.4 fL (ref 80.0–100.0)
Platelets: 132 10*3/uL — ABNORMAL LOW (ref 150–400)
RBC: 4.1 MIL/uL (ref 3.87–5.11)
RDW: 14.1 % (ref 11.5–15.5)
WBC: 9.7 10*3/uL (ref 4.0–10.5)
nRBC: 0 % (ref 0.0–0.2)

## 2020-10-12 LAB — HEPARIN LEVEL (UNFRACTIONATED)
Heparin Unfractionated: 0.53 IU/mL (ref 0.30–0.70)
Heparin Unfractionated: 0.83 IU/mL — ABNORMAL HIGH (ref 0.30–0.70)
Heparin Unfractionated: 0.84 IU/mL — ABNORMAL HIGH (ref 0.30–0.70)

## 2020-10-12 MED ORDER — TRAMADOL HCL 50 MG PO TABS
50.0000 mg | ORAL_TABLET | Freq: Two times a day (BID) | ORAL | Status: DC | PRN
  Administered 2020-10-12 – 2020-10-13 (×2): 50 mg via ORAL
  Filled 2020-10-12 (×2): qty 1

## 2020-10-12 MED ORDER — HEPARIN (PORCINE) 25000 UT/250ML-% IV SOLN
900.0000 [IU]/h | INTRAVENOUS | Status: DC
  Administered 2020-10-12 – 2020-10-13 (×2): 1000 [IU]/h via INTRAVENOUS
  Filled 2020-10-12 (×2): qty 250

## 2020-10-12 NOTE — Progress Notes (Addendum)
[facility] for heparin Indication: pulmonary embolus, RLE DVT, intra-arterial clot  Allergies  Allergen Reactions  . Morphine Other (See Comments)    Respiratory compromise post op  . Penicillins Rash    Has patient had a PCN reaction causing immediate rash, facial/tongue/throat swelling, SOB or lightheadedness with hypotension: No Has patient had a PCN reaction causing severe rash involving mucus membranes or skin necrosis: No Has patient had a PCN reaction that required hospitalization: No Has patient had a PCN reaction occurring within the last 10 years: No If all of the above answers are "NO", then may proceed with Cephalosporin use. urticaria  . Levofloxacin Other (See Comments)     SHOULDER PAIN    Patient Measurements: Height: 5\' 7"  (170.2 cm) Weight: 76.6 kg (168 lb 12.8 oz) IBW/kg (Calculated) : 61.6 Heparin Dosing Weight: 76.2  Vital Signs: Temp: 98.4 F (36.9 C) (02/18 0730) Temp Source: Oral (02/18 0730) BP: 120/75 (02/18 0730) Pulse Rate: 61 (02/18 0730)  Labs: Recent Labs    10/11/20 1425 10/11/20 1443 10/11/20 1740 10/11/20 2347 10/12/20 0646  HGB 14.4  --   --  13.5  --   HCT 44.6  --   --  39.1  --   PLT 136*  --   --  132*  --   HEPARINUNFRC  --  <0.10*  --  0.83* 0.84*  CREATININE 0.91  --   --  0.91  --   TROPONINIHS 7  --  6  --   --     Estimated Creatinine Clearance: 56.1 mL/min (by C-G formula based on SCr of 0.91 mg/dL).   Assessment: 75 yo female presenting with extensive biltateral PE with RHS (RV/LV ratio 1.4), acute RLE DVt and likely intra-atrial septum clot on ECHO.  No AC noted PTA.  Pharmacy consulted to dose heparin.    Heparin level supratherapeutic (0.84) despite rate decrease to 1100 units/hr. No bleeding noted per RN  Goal of Therapy:  Heparin level 0.3-0.7 units/ml Monitor platelets by anticoagulation protocol: Yes   Plan:  Hold heparin drip for 30 minutes and decrease to 1000  units/hr, discussed with RN F/u 6 hr heparin level Monitor daily HL, CBC/plt Monitor for signs/symptoms of bleeding  F/u transition to PO anticoagulant   Benetta Spar, PharmD, BCPS, BCCP Clinical Pharmacist  Please check AMION for all [facility] phone numbers After 10:00 PM, call [facility]

## 2020-10-12 NOTE — Progress Notes (Signed)
[facility] for heparin Indication: pulmonary embolus  Allergies  Allergen Reactions  . Morphine Other (See Comments)    Respiratory compromise post op  . Penicillins Rash    Has patient had a PCN reaction causing immediate rash, facial/tongue/throat swelling, SOB or lightheadedness with hypotension: No Has patient had a PCN reaction causing severe rash involving mucus membranes or skin necrosis: No Has patient had a PCN reaction that required hospitalization: No Has patient had a PCN reaction occurring within the last 10 years: No If all of the above answers are "NO", then may proceed with Cephalosporin use. urticaria  . Levofloxacin Other (See Comments)     SHOULDER PAIN    Patient Measurements: Height: 5\' 7"  (170.2 cm) Weight: 76.6 kg (168 lb 12.8 oz) IBW/kg (Calculated) : 61.6 Heparin Dosing Weight: 76.2  Vital Signs: Temp: 98.4 F (36.9 C) (02/17 2315) Temp Source: Oral (02/17 2315) BP: 118/79 (02/17 2315) Pulse Rate: 73 (02/17 2315)  Labs: Recent Labs    10/11/20 1425 10/11/20 1443 10/11/20 1740 10/11/20 2347  HGB 14.4  --   --  13.5  HCT 44.6  --   --  39.1  PLT 136*  --   --  132*  HEPARINUNFRC  --  <0.10*  --  0.83*  CREATININE 0.91  --   --   --   TROPONINIHS 7  --  6  --     Estimated Creatinine Clearance: 56.1 mL/min (by C-G formula based on SCr of 0.91 mg/dL).   Assessment: 75 yo female presenting after found to have a PE.  Patient was experiencing shortness of breath on exertion for the past 9 days.  Outpatient CT today found extensive biltateral PE with RHS (RV/LV ratio 1.4) consistent w/ at least submassive PE.  No AC noted PTA.  Pharmacy consulted to dose heparin.    Heparin level supratherapeutic (0.83) on gtt at 1250 units/hr. No bleeding noted.  Goal of Therapy:  Heparin level 0.3-0.7 units/ml Monitor platelets by anticoagulation protocol: Yes   Plan:  Decrease heparin drip to 1100 units/hr F/u 6 hr  heparin level  Sherlon Handing, PharmD, BCPS Please see amion for complete clinical pharmacist phone list 10/12/2020 12:13 AM

## 2020-10-12 NOTE — TOC Benefit Eligibility Note (Signed)
Transition of Care [facility]) Benefit Eligibility Note    Patient Details  Name: Sarah Valdez MRN: [id number] Date of Birth: 1945-11-26   Medication/Dose: ELIQUIS  2.5 MG BID and  ELIQUIS  5 MG BID     XARELTO 15 MG BID  and XARELTO 20 MG BID        Prescription Coverage Preferred Pharmacy: [facility] with Person/Company/Phone Number:: ED @ [place]  #  [phone] OPT-0           Additional Notes: NO  PHARMACY BENEFIT ON FILE and PT'S HAS  GOOD-RX CARD ONLY    Memory Argue Phone Number: 10/12/2020, 5:06 PM

## 2020-10-12 NOTE — TOC Progression Note (Incomplete)
Transition of Care [facility]) - Progression Note    Patient Details  Name: Sarah Valdez MRN: [id number] Date of Birth: 02-10-1946  Transition of [facility]) CM/SW Contact  Zenon Mayo, RN Phone Number: 10/12/2020, 4:37 PM  Clinical Narrative:    Daughter states patient has a rollator and the wheels on the back are too big        Expected Discharge Plan and Services                                                 Social Determinants of Health (SDOH) Interventions    Readmission Risk Interventions No flowsheet data found.

## 2020-10-12 NOTE — Progress Notes (Signed)
Nutrition Brief Note  Patient identified on the Malnutrition Screening Tool (MST) Report  Wt Readings from Last 15 Encounters:  10/12/20 76.2 kg  09/24/20 78.1 kg  09/05/19 69.9 kg  09/07/18 77.6 kg  07/26/18 75.8 kg  01/21/18 77.8 kg  11/16/17 77.2 kg  10/26/17 76.4 kg  07/07/17 73.9 kg  05/21/17 74.4 kg  05/20/17 75 kg  04/22/17 79 kg  03/05/17 78 kg  12/25/16 80.4 kg  06/18/16 77.6 kg   Sarah Valdez is a 75 y.o. female with medical history significant of HLD; CVA; and chronic diastolic CHF presenting with SOB, cough, chest pain.  PCP ordered CTA which showed B PE.  She reports that she had radioablation of her spine some time ago.  She might have been less mobile following this although not markedly so.  For the last few weeks following the procedure, she was noticing RLE swelling and mild erythema.  She started having some SOB with ambulation.  When she did too much, she would get SOB and cough with mild pleuritic CP.  This has been going on for weeks and since it was persistent she asked her PCP about it.  She saw him a few days ago and he ordered blood work yesterday.  She had to go back for an additional test today (?D-dimer) and was sent for a CTA which showed extensive B pulmonary emboli with R heart strain and a possible area of pulmonary infarction.  She has no prior h/o VTE but has had 3 cryptogenic strokes and has a loop recorder in place.  Pt admitted with extensive pulmonary embolism and extensive rt lower extremity DVT.   Reviewed I/O's: +422 ml x 24 hours  Pt reports good appetite, cosuming lunch at time of visit. PTA she was consuming 3 meals per day, which consisted of a meat, starch and vegetables.   Pt denies any weight loss. She suspects some weight gain and possible muscle loss, as she has not been as active since the COVID-19 pandemic (no longer goes ti the gym).   Pt complained that she did not receive some of the ordered items on her meal tray. Verified  dinner meal order in [employer] ordering System and added manager's check to meals trays.   Nutrition-Focused physical exam completed. Findings are no fat depletion, no muscle depletion, and no edema.   Labs reviewed.  Current diet order is Heart Healthy, patient is consuming approximately 100% of meals at this time. Labs and medications reviewed.   No nutrition interventions warranted at this time. If nutrition issues arise, please consult RD.   Loistine Chance, RD, LDN, [place] Registered Dietitian II Certified Diabetes Care and Education Specialist Please refer to [facility] for RD and/or RD on-call/weekend/after hours pager

## 2020-10-12 NOTE — Telephone Encounter (Signed)
Called to get an update. Patient is in hospital with blood clots in lung and right leg.

## 2020-10-12 NOTE — Progress Notes (Signed)
PROGRESS NOTE    Sarah Valdez  GEX:[id number] DOB: 05/24/1946 DOA: 10/11/2020 PCP: Shon Baton, MD  Brief Narrative: 75 year old female history of cryptogenic stroke,, dyslipidemia was sent to the ED due to abnormal chest CT concerning for extensive PE. -, Patient had radiofrequency ablation procedure to her lumbar spine in mid January and early February, since then has been a little less mobile than before but not significantly, 2 weeks ago noted mild right leg/calf discomfort and dyspnea with exertion associated with cough, this persisted, she then saw her PCP for it who ordered a D-dimer followed by extensive bilateral pulmonary emboli with right heart strain and an area of pulmonary infarction she was then sent to the ED. -Started on IV heparin, remained hemodynamically stable  Assessment & Plan:   Extensive bilateral pulmonary embolism Extensive right lower extremity DVT -It appears that this was likely unprovoked -Continue IV heparin today, transition to Xarelto or Eliquis tomorrow -2D echocardiogram notes mild RV dilation, preserved EF and RV function, in addition echo reports echodensity adjacent to the intra-atrial septum. Cannot  determine etiology of mass, but given extensive pulmonary embolism burden,  concern would be for clot, will review this with cardiology again today -She will need repeat echo in 1 to 2 months -Will refer to hematology, low yield with hypercoagulable panel at this time on IV heparin with extensive clots  History of cryptogenic strokes -Has loop recorder which not reveal A. Fib -Stop aspirin while on anticoagulation  Hypertension -Continue Cardizem   DVT prophylaxis: IV heparin Code Status: Full code Family Communication: No family at bedside Disposition Plan:  Status is: Inpatient  Remains inpatient appropriate because:Inpatient level of care appropriate due to severity of illness   Dispo: The patient is from: Home              Anticipated  d/c is to: Home              Anticipated d/c date is: 2 days              Patient currently is not medically stable to d/c.   Difficult to place patient No   Consultants:   Will d/w Cards   Procedures:   Antimicrobials:    Subjective: -Feels okay, reports some dyspnea on exertion, denies any dizziness  Objective: Vitals:   10/12/20 0001 10/12/20 0435 10/12/20 0730 10/12/20 1004  BP:  113/83 120/75   Pulse:  73 61   Resp:  20 14   Temp:  98.1 F (36.7 C) 98.4 F (36.9 C)   TempSrc:  Oral Oral   SpO2:  92% 94%   Weight: 76.6 kg   76.2 kg  Height:        Intake/Output Summary (Last 24 hours) at 10/12/2020 1007 Last data filed at 10/12/2020 0348 Gross per 24 hour  Intake 422.43 ml  Output --  Net 422.43 ml   Filed Weights   10/11/20 1400 10/12/20 0001 10/12/20 1004  Weight: 76.2 kg 76.6 kg 76.2 kg    Examination:  General exam: Pleasant female sitting up in bed, appears younger than stated age, AAOx3, no distress CVS: S1-S2, regular rate rhythm Lungs: Clear bilaterally, diminished at the bases Abdomen: Soft, nontender, bowel sounds present Extremities: No edema Skin: No rashes on exposed skin   Data Reviewed:   CBC: Recent Labs  Lab 10/11/20 1425 10/11/20 2347  WBC 10.0 9.7  NEUTROABS 7.2  --   HGB 14.4 13.5  HCT 44.6 39.1  MCV 99.1  95.4  PLT 136* 335*   Basic Metabolic Panel: Recent Labs  Lab 10/11/20 1425 10/11/20 2347  NA 139 139  K 3.5 3.6  CL 110 109  CO2 20* 20*  GLUCOSE 92 103*  BUN 22 24*  CREATININE 0.91 0.91  CALCIUM 8.7* 8.4*   GFR: Estimated Creatinine Clearance: 56 mL/min (by C-G formula based on SCr of 0.91 mg/dL). Liver Function Tests: Recent Labs  Lab 10/11/20 1425  AST 23  ALT 28  ALKPHOS 46  BILITOT 0.7  PROT 6.5  ALBUMIN 3.8   No results for input(s): LIPASE, AMYLASE in the last 168 hours. No results for input(s): AMMONIA in the last 168 hours. Coagulation Profile: No results for input(s): INR, PROTIME in  the last 168 hours. Cardiac Enzymes: No results for input(s): CKTOTAL, CKMB, CKMBINDEX, TROPONINI in the last 168 hours. BNP (last 3 results) No results for input(s): PROBNP in the last 8760 hours. HbA1C: No results for input(s): HGBA1C in the last 72 hours. CBG: No results for input(s): GLUCAP in the last 168 hours. Lipid Profile: No results for input(s): CHOL, HDL, LDLCALC, TRIG, CHOLHDL, LDLDIRECT in the last 72 hours. Thyroid Function Tests: No results for input(s): TSH, T4TOTAL, FREET4, T3FREE, THYROIDAB in the last 72 hours. Anemia Panel: No results for input(s): VITAMINB12, FOLATE, FERRITIN, TIBC, IRON, RETICCTPCT in the last 72 hours. Urine analysis:    Component Value Date/Time   COLORURINE YELLOW 12/04/2009 [place] 12/04/2009 0834   LABSPEC >=1.030 12/04/2009 0834   PHURINE 5.5 12/04/2009 0834   GLUCOSEU NEGATIVE 12/04/2009 0834   HGBUR small 09/08/2007 0908   BILIRUBINUR negative 06/10/2013 1433   KETONESUR NEGATIVE 12/04/2009 0834   PROTEINUR negative 06/10/2013 1433   UROBILINOGEN 0.2 06/10/2013 1433   UROBILINOGEN 0.2 12/04/2009 0834   NITRITE negative 06/10/2013 1433   NITRITE NEGATIVE 12/04/2009 0834   LEUKOCYTESUR Negative 06/10/2013 1433   Sepsis Labs: @LABRCNTIP (procalcitonin:4,lacticidven:4)  ) Recent Results (from the past 240 hour(s))  Resp Panel by RT-PCR (Flu A&B, Covid) Nasopharyngeal Swab     Status: None   Collection Time: 10/11/20  2:25 PM   Specimen: Nasopharyngeal Swab; Nasopharyngeal(NP) swabs in vial transport medium  Result Value Ref Range Status   SARS Coronavirus 2 by RT PCR NEGATIVE NEGATIVE Final    Comment: (NOTE) SARS-CoV-2 target nucleic acids are NOT DETECTED.  The SARS-CoV-2 RNA is generally detectable in upper respiratory specimens during the acute phase of infection. The lowest concentration of SARS-CoV-2 viral copies this assay can detect is 138 copies/mL. A negative result does not preclude  SARS-Cov-2 infection and should not be used as the sole basis for treatment or other patient management decisions. A negative result may occur with  improper specimen collection/handling, submission of specimen other than nasopharyngeal swab, presence of viral mutation(s) within the areas targeted by this assay, and inadequate number of viral copies(<138 copies/mL). A negative result must be combined with clinical observations, patient history, and epidemiological information. The expected result is Negative.  Fact Sheet for Patients:  EntrepreneurPulse.com.au  Fact Sheet for Healthcare Providers:  IncredibleEmployment.be  This test is no t yet approved or cleared by the [place] FDA and  has been authorized for detection and/or diagnosis of SARS-CoV-2 by FDA under an Emergency Use Authorization (EUA). This EUA will remain  in effect (meaning this test can be used) for the duration of the COVID-19 declaration under Section 564(b)(1) of the Act, 21 U.S.C.section 360bbb-3(b)(1), unless the authorization is terminated  or revoked sooner.  Influenza A by PCR NEGATIVE NEGATIVE Final   Influenza B by PCR NEGATIVE NEGATIVE Final    Comment: (NOTE) The Xpert Xpress SARS-CoV-2/FLU/RSV plus assay is intended as an aid in the diagnosis of influenza from Nasopharyngeal swab specimens and should not be used as a sole basis for treatment. Nasal washings and aspirates are unacceptable for Xpert Xpress SARS-CoV-2/FLU/RSV testing.  Fact Sheet for Patients: EntrepreneurPulse.com.au  Fact Sheet for Healthcare Providers: IncredibleEmployment.be  This test is not yet approved or cleared by the [place] FDA and has been authorized for detection and/or diagnosis of SARS-CoV-2 by FDA under an Emergency Use Authorization (EUA). This EUA will remain in effect (meaning this test can be used) for the duration of  the COVID-19 declaration under Section 564(b)(1) of the Act, 21 U.S.C. section 360bbb-3(b)(1), unless the authorization is terminated or revoked.  Performed at [facility], [place] [address] [place], [place] [address]          Radiology Studies: CT ANGIO CHEST PE W OR WO CONTRAST  Result Date: 10/11/2020 CLINICAL DATA:  Shortness of breath, elevated D-dimer EXAM: CT ANGIOGRAPHY CHEST WITH CONTRAST TECHNIQUE: Multidetector CT imaging of the chest was performed using the standard protocol during bolus administration of intravenous contrast. Multiplanar CT image reconstructions and MIPs were obtained to evaluate the vascular anatomy. CONTRAST:  75mL ISOVUE-370 IOPAMIDOL (ISOVUE-370) INJECTION 76% COMPARISON:  Chest x-ray 11/12/2009 FINDINGS: Cardiovascular: Satisfactory opacification of the pulmonary arteries. Extensive bilateral pulmonary emboli with thrombus present within both the right and left main pulmonary artery with extension into the lobar branches and multiple segmental branches. No saddle embolism. RV to LV ratio of 1.4 indicative of right heart strain. Heart size is normal. No pericardial effusion. Thoracic aorta is nonaneurysmal. Three vessel arch. Mediastinum/Nodes: No enlarged mediastinal, hilar, or axillary lymph nodes. Thyroid gland, trachea, and esophagus demonstrate no significant findings. Lungs/Pleura: Small wedge-shaped opacity in the periphery of the left upper lobe anteriorly (series 12, image 92) which may reflect an area of pulmonary infarction. Elsewhere, lungs are clear. No pleural effusion or pneumothorax. Upper Abdomen: Suspect cholelithiasis within the partially imaged gallbladder. Musculoskeletal: Lower left chest wall implanted loop recorder. Left worse than right glenohumeral joint osteoarthritis. Advanced degenerative disc disease of T12-L1. No acute osseous findings. Review of the MIP images confirms the above findings. IMPRESSION: 1. Extensive bilateral  pulmonary emboli with evidence of right heart strain (RV/LV Ratio 1.4) consistent with at least submassive (intermediate risk) PE. The presence of right heart strain has been associated with an increased risk of morbidity and mortality. 2. Small wedge-shaped opacity in the periphery of the left upper lobe anteriorly may reflect an area of pulmonary infarction. 3. Suspect cholelithiasis. Critical Value/emergent results were called by telephone at the time of interpretation on 10/11/2020 at 12:31 pm to provider Dr Shon Baton, who verbally acknowledged these results. Electronically Signed   By: Davina Poke D.O.   On: 10/11/2020 12:32   VAS [place] LOWER EXTREMITY VENOUS (DVT) (ONLY [facility] & [facility] 7a-7p)  Result Date: 10/11/2020  Lower Venous DVT Study Indications: Pain, and pulmonary embolism.  Comparison Study: No prior study on file Performing Technologist: Sharion Dove RVS  Examination Guidelines: A complete evaluation includes B-mode imaging, spectral Doppler, color Doppler, and power Doppler as needed of all accessible portions of each vessel. Bilateral testing is considered an integral part of a complete examination. Limited examinations for reoccurring indications may be performed as noted. The reflux portion of the exam is performed with the patient  in reverse Trendelenburg.  +---------+---------------+---------+-----------+----------+--------------+ RIGHT    CompressibilityPhasicitySpontaneityPropertiesThrombus Aging +---------+---------------+---------+-----------+----------+--------------+ CFV      Partial                                      Acute          +---------+---------------+---------+-----------+----------+--------------+ SFJ      Full                                                        +---------+---------------+---------+-----------+----------+--------------+ FV Prox  None                                         Acute           +---------+---------------+---------+-----------+----------+--------------+ FV Mid   None                                         Acute          +---------+---------------+---------+-----------+----------+--------------+ FV DistalNone                                         Acute          +---------+---------------+---------+-----------+----------+--------------+ PFV      Full                                                        +---------+---------------+---------+-----------+----------+--------------+ POP      None           No       No                   Acute          +---------+---------------+---------+-----------+----------+--------------+ PTV      Full                                                        +---------+---------------+---------+-----------+----------+--------------+ PERO     Full                                                        +---------+---------------+---------+-----------+----------+--------------+ Gastroc  None                                         Acute          +---------+---------------+---------+-----------+----------+--------------+   +----+---------------+---------+-----------+----------+--------------+ LEFTCompressibilityPhasicitySpontaneityPropertiesThrombus Aging +----+---------------+---------+-----------+----------+--------------+ CFV Full  Yes      Yes                                 +----+---------------+---------+-----------+----------+--------------+     Summary: RIGHT: - Findings consistent with acute deep vein thrombosis involving the right common femoral vein, right femoral vein, right popliteal vein, and right gastrocnemius veins.   *See table(s) above for measurements and observations. Electronically signed by Monica Martinez MD on 10/11/2020 at 4:35:17 PM.    Final    ECHOCARDIOGRAM LIMITED  Result Date: 10/11/2020    ECHOCARDIOGRAM LIMITED REPORT   Patient Name:   Sarah Valdez  Date of Exam: 10/11/2020 Medical Rec #:  [id number]      Height:       67.0 in Accession #:    [id number]     Weight:       168.0 lb Date of Birth:  [birth year]     BSA:          1.878 m Patient Age:    70 years       BP:           120/104 mmHg Patient Gender: F              HR:           67 bpm. Exam Location:  Inpatient Procedure: Limited Echo, Limited Color Doppler and Cardiac Doppler                        STAT ECHO  Reported to: Dr. Harrell Gave on 10/11/2020 4:52:00 PM Communicated via amion message to Dr. Lorin Mercy at 5:43 PM. Indications:     I26.02 Pulmonary embolus  History:         Patient has prior history of Echocardiogram examinations, most                  recent 01/12/2017. Stroke; Risk Factors:Dyslipidemia. Cancer.  Sonographer:     Jonelle Sidle Dance Referring Phys:  [place] Diagnosing Phys: Buford Dresser MD IMPRESSIONS  1. Left ventricular ejection fraction, by estimation, is 55 to 60%. The left ventricle has normal function. The left ventricle has no regional wall motion abnormalities. Left ventricular diastolic function could not be evaluated.  2. Right ventricular systolic function is normal. The right ventricular size is mildly enlarged. There is normal pulmonary artery systolic pressure.  3. There is an echodensity adjacent to the intra-atrial septum. Cannot determine etiology of mass, but given extensive pulmonary embolism burden, concern would be for clot. Consider reimaging in the future vs additional imaging modality.  4. The mitral valve is grossly normal. Trivial mitral valve regurgitation. No evidence of mitral stenosis. Moderate mitral annular calcification.  5. The aortic valve is tricuspid. Aortic valve regurgitation is not visualized. No aortic stenosis is present.  6. The inferior vena cava is normal in size with greater than 50% respiratory variability, suggesting right atrial pressure of 3 mmHg. Comparison(s): No prior Echocardiogram. Conclusion(s)/Recommendation(s):  Normal LV fuction, mildly enlarged RV size. Echodensity adjacent to intra-atrial septum, see comments. FINDINGS  Left Ventricle: Left ventricular ejection fraction, by estimation, is 55 to 60%. The left ventricle has normal function. The left ventricle has no regional wall motion abnormalities. There is no left ventricular hypertrophy. Left ventricular diastolic function could not be evaluated. Right Ventricle: The right ventricular size is mildly enlarged. Right vetricular wall thickness was not well visualized. Right ventricular  systolic function is normal. There is normal pulmonary artery systolic pressure. The tricuspid regurgitant velocity  is 2.62 m/s, and with an assumed right atrial pressure of 3 mmHg, the estimated right ventricular systolic pressure is 25.9 mmHg. Left Atrium: Left atrial size was normal in size. Right Atrium: Right atrial size was normal in size. Pericardium: Trivial pericardial effusion is present. Presence of pericardial fat pad. Mitral Valve: The mitral valve is grossly normal. There is mild calcification of the mitral valve leaflet(s). Moderate mitral annular calcification. Trivial mitral valve regurgitation. No evidence of mitral valve stenosis. Tricuspid Valve: The tricuspid valve is normal in structure. Tricuspid valve regurgitation is mild . No evidence of tricuspid stenosis. Aortic Valve: The aortic valve is tricuspid. There is mild to moderate aortic valve annular calcification. Aortic valve regurgitation is not visualized. No aortic stenosis is present. Pulmonic Valve: The pulmonic valve was grossly normal. Pulmonic valve regurgitation is mild to moderate. No evidence of pulmonic stenosis. Aorta: The aortic root, ascending aorta and aortic arch are all structurally normal, with no evidence of dilitation or obstruction. Venous: The inferior vena cava is normal in size with greater than 50% respiratory variability, suggesting right atrial pressure of 3 mmHg. IAS/Shunts: The atrial  septum is grossly normal. LEFT VENTRICLE PLAX 2D LVIDd:         3.70 cm LVIDs:         2.60 cm LV PW:         0.90 cm LV IVS:        1.00 cm LVOT diam:     1.80 cm LV SV:         47 LV SV Index:   25 LVOT Area:     2.54 cm  RIGHT VENTRICLE             IVC RV Basal diam:  2.40 cm     IVC diam: 1.80 cm RV S prime:     12.70 cm/s TAPSE (M-mode): 1.8 cm LEFT ATRIUM           Index       RIGHT ATRIUM           Index LA diam:      2.90 cm 1.54 cm/m  RA Area:     10.70 cm LA Vol (A2C): 31.9 ml 16.99 ml/m RA Volume:   25.20 ml  13.42 ml/m LA Vol (A4C): 17.7 ml 9.43 ml/m  AORTIC VALVE LVOT Vmax:   88.70 cm/s LVOT Vmean:  60.500 cm/s LVOT VTI:    0.183 m  AORTA Ao Root diam: 2.80 cm Ao Asc diam:  3.30 cm MITRAL VALVE               TRICUSPID VALVE MV Area (PHT): 2.54 cm    TR Peak grad:   27.5 mmHg MV Decel Time: 299 msec    TR Vmax:        262.00 cm/s MV E velocity: 52.40 cm/s MV A velocity: 72.30 cm/s  SHUNTS MV E/A ratio:  0.72        Systemic VTI:  0.18 m                            Systemic Diam: 1.80 cm Buford Dresser MD Electronically signed by Buford Dresser MD Signature Date/Time: 10/11/2020/5:38:30 PM    Final (Updated)         Scheduled Meds: . diltiazem  120 mg Oral Daily  . docusate sodium  100 mg Oral BID  . loratadine  10 mg Oral Daily  . pravastatin  40 mg Oral Daily  . sodium chloride flush  3 mL Intravenous Q12H   Continuous Infusions: . heparin 1,000 Units/hr (10/12/20 0955)     LOS: 1 day    Time spent: 72min  Domenic Polite, MD [facility]  10/12/2020, 10:07 AM

## 2020-10-12 NOTE — Progress Notes (Signed)
[place] for heparin Indication: pulmonary embolus, RLE DVT, intra-arterial clot  Allergies  Allergen Reactions  . Morphine Other (See Comments)    Respiratory compromise post op  . Penicillins Rash    Has patient had a PCN reaction causing immediate rash, facial/tongue/throat swelling, SOB or lightheadedness with hypotension: No Has patient had a PCN reaction causing severe rash involving mucus membranes or skin necrosis: No Has patient had a PCN reaction that required hospitalization: No Has patient had a PCN reaction occurring within the last 10 years: No If all of the above answers are "NO", then may proceed with Cephalosporin use. urticaria  . Levofloxacin Other (See Comments)     SHOULDER PAIN    Patient Measurements: Height: 5\' 7"  (170.2 cm) Weight: 76.2 kg (167 lb 15.9 oz) IBW/kg (Calculated) : 61.6 Heparin Dosing Weight: 76.2  Vital Signs: Temp: 97.6 F (36.4 C) (02/18 1209) Temp Source: Oral (02/18 1209) BP: 91/78 (02/18 1209) Pulse Rate: 64 (02/18 1209)  Labs: Recent Labs    10/11/20 1425 10/11/20 1443 10/11/20 1740 10/11/20 2347 10/12/20 0646 10/12/20 1439  HGB 14.4  --   --  13.5  --   --   HCT 44.6  --   --  39.1  --   --   PLT 136*  --   --  132*  --   --   HEPARINUNFRC  --    < >  --  0.83* 0.84* 0.53  CREATININE 0.91  --   --  0.91  --   --   TROPONINIHS 7  --  6  --   --   --    < > = values in this interval not displayed.    Estimated Creatinine Clearance: 56 mL/min (by C-G formula based on SCr of 0.91 mg/dL).   Assessment: 75 yo female presenting with extensive biltateral PE with RHS (RV/LV ratio 1.4), acute RLE DVt and likely intra-atrial septum clot on ECHO.  No AC noted PTA.  Pharmacy consulted to dose heparin.    Heparin level therapeutic this afternoon (0.53) after holding heparin x 1hr and rate decrease earlier today. CBC stable. No active bleed issues reported.  Goal of Therapy:  Heparin  level 0.3-0.7 units/ml Monitor platelets by anticoagulation protocol: Yes   Plan:  Continue heparin at 1000 units/hr Check confirmatory heparin level with AM labs Monitor daily CBC, s/sx bleeding F/u transition to PO anticoagulant    Arturo Morton, PharmD, BCPS Please check AMION for all [facility] contact numbers Clinical Pharmacist 10/12/2020 3:56 PM

## 2020-10-13 DIAGNOSIS — I5032 Chronic diastolic (congestive) heart failure: Secondary | ICD-10-CM | POA: Diagnosis not present

## 2020-10-13 DIAGNOSIS — I2609 Other pulmonary embolism with acute cor pulmonale: Secondary | ICD-10-CM | POA: Diagnosis not present

## 2020-10-13 DIAGNOSIS — I639 Cerebral infarction, unspecified: Secondary | ICD-10-CM | POA: Diagnosis not present

## 2020-10-13 DIAGNOSIS — I10 Essential (primary) hypertension: Secondary | ICD-10-CM | POA: Diagnosis not present

## 2020-10-13 LAB — CBC
HCT: 36.8 % (ref 36.0–46.0)
Hemoglobin: 12.7 g/dL (ref 12.0–15.0)
MCH: 33.4 pg (ref 26.0–34.0)
MCHC: 34.5 g/dL (ref 30.0–36.0)
MCV: 96.8 fL (ref 80.0–100.0)
Platelets: 146 10*3/uL — ABNORMAL LOW (ref 150–400)
RBC: 3.8 MIL/uL — ABNORMAL LOW (ref 3.87–5.11)
RDW: 14.1 % (ref 11.5–15.5)
WBC: 7.6 10*3/uL (ref 4.0–10.5)
nRBC: 0 % (ref 0.0–0.2)

## 2020-10-13 LAB — HEPARIN LEVEL (UNFRACTIONATED): Heparin Unfractionated: 0.69 IU/mL (ref 0.30–0.70)

## 2020-10-13 MED ORDER — APIXABAN 5 MG PO TABS
10.0000 mg | ORAL_TABLET | Freq: Two times a day (BID) | ORAL | Status: DC
  Administered 2020-10-13 – 2020-10-14 (×3): 10 mg via ORAL
  Filled 2020-10-13 (×4): qty 2

## 2020-10-13 MED ORDER — RIVAROXABAN (XARELTO) VTE STARTER PACK (15 & 20 MG): ORAL_TABLET | ORAL | 0 refills | Status: DC

## 2020-10-13 MED ORDER — APIXABAN 5 MG PO TABS: 5.0000 mg | ORAL_TABLET | Freq: Two times a day (BID) | ORAL | Status: DC

## 2020-10-13 MED ORDER — APIXABAN (ELIQUIS) VTE STARTER PACK (10MG AND 5MG): ORAL_TABLET | ORAL | 0 refills | Status: DC

## 2020-10-13 NOTE — TOC Progression Note (Signed)
Transition of Care [facility]) - Progression Note    Patient Details  Name: Sarah Valdez MRN: [id number] Date of Birth: 1945-10-29  Transition of Care [facility]) CM/SW Contact  Zenon Mayo, RN Phone Number: 10/13/2020, 12:53 PM  Clinical Narrative:    Patient is for possible dc today, she will dc on eliquis.  NCM gave her the 30 day coupon for eliquis.  Also gave her the patient assist form with the maintenance script for eliquis.  MD sent the xarelto to [facility] to see how much it would cost it was 859.16 with good rx. So she will not be getting the xarelto.         Expected Discharge Plan and Services                                                 Social Determinants of Health (SDOH) Interventions    Readmission Risk Interventions No flowsheet data found.

## 2020-10-13 NOTE — Discharge Instructions (Signed)
 Apixaban Tablets What is this medicine? APIXABAN (a PIX a ban) is an anticoagulant (blood thinner). It is used to lower the chance of stroke in people with a medical condition called atrial fibrillation. It is also used to treat or prevent blood clots in the lungs or in the veins. This medicine may be used for other purposes; ask your health care provider or pharmacist if you have questions. COMMON BRAND NAME(S): Eliquis What should I tell my health care provider before I take this medicine? They need to know if you have any of these conditions:  antiphospholipid antibody syndrome  bleeding disorders  bleeding in the brain  blood in your stools (black or tarry stools) or if you have blood in your vomit  history of blood clots  history of stomach bleeding  kidney disease  liver disease  mechanical heart valve  an unusual or allergic reaction to apixaban, other medicines, foods, dyes, or preservatives  pregnant or trying to get pregnant  breast-feeding How should I use this medicine? Take this medicine by mouth with a glass of water. Follow the directions on the prescription label. You can take it with or without food. If it upsets your stomach, take it with food. Take your medicine at regular intervals. Do not take it more often than directed. Do not stop taking except on your doctor's advice. Stopping this medicine may increase your risk of a blood clot. Be sure to refill your prescription before you run out of medicine. Talk to your pediatrician regarding the use of this medicine in children. Special care may be needed. Overdosage: If you think you have taken too much of this medicine contact a poison control center or emergency room at once. NOTE: This medicine is only for you. Do not share this medicine with others. What if I miss a dose? If you miss a dose, take it as soon as you can. If it is almost time for your next dose, take only that dose. Do not take double or  extra doses. What may interact with this medicine? This medicine may interact with the following:  aspirin and aspirin-like medicines  certain medicines for fungal infections like ketoconazole and itraconazole  certain medicines for seizures like carbamazepine and phenytoin  certain medicines that treat or prevent blood clots like warfarin, enoxaparin, and dalteparin  clarithromycin  NSAIDs, medicines for pain and inflammation, like ibuprofen or naproxen  rifampin  ritonavir  St. John's wort This list may not describe all possible interactions. Give your health care provider a list of all the medicines, herbs, non-prescription drugs, or dietary supplements you use. Also tell them if you smoke, drink alcohol, or use illegal drugs. Some items may interact with your medicine. What should I watch for while using this medicine? Visit your healthcare professional for regular checks on your progress. You may need blood work done while you are taking this medicine. Your condition will be monitored carefully while you are receiving this medicine. It is important not to miss any appointments. Avoid sports and activities that might cause injury while you are using this medicine. Severe falls or injuries can cause unseen bleeding. Be careful when using sharp tools or knives. Consider using an electric razor. Take special care brushing or flossing your teeth. Report any injuries, bruising, or red spots on the skin to your healthcare professional. If you are going to need surgery or other procedure, tell your healthcare professional that you are taking this medicine. Wear a medical ID bracelet   or chain. Carry a card that describes your disease and details of your medicine and dosage times. What side effects may I notice from receiving this medicine? Side effects that you should report to your doctor or health care professional as soon as possible:  allergic reactions like skin rash, itching or hives,  swelling of the face, lips, or tongue  signs and symptoms of bleeding such as bloody or black, tarry stools; red or dark-brown urine; spitting up blood or brown material that looks like coffee grounds; red spots on the skin; unusual bruising or bleeding from the eye, gums, or nose  signs and symptoms of a blood clot such as chest pain; shortness of breath; pain, swelling, or warmth in the leg  signs and symptoms of a stroke such as changes in vision; confusion; trouble speaking or understanding; severe headaches; sudden numbness or weakness of the face, arm or leg; trouble walking; dizziness; loss of coordination This list may not describe all possible side effects. Call your doctor for medical advice about side effects. You may report side effects to FDA at 1-800-FDA-1088. Where should I keep my medicine? Keep out of the reach of children. Store at room temperature between 20 and 25 degrees C (68 and 77 degrees F). Throw away any unused medicine after the expiration date. NOTE: This sheet is a summary. It may not cover all possible information. If you have questions about this medicine, talk to your doctor, pharmacist, or health care provider.  2021 [employer]/Gold Standard (2020-06-20 16:54:11)  

## 2020-10-13 NOTE — Progress Notes (Addendum)
ANTICOAGULATION CONSULT NOTE  Pharmacy Consult for heparin Indication: pulmonary embolus, RLE DVT, intra-arterial clot  Patient Measurements: Height: 5\' 7"  (170.2 cm) Weight: 76.7 kg (169 lb 1.5 oz) IBW/kg (Calculated) : 61.6 Heparin Dosing Weight: 76.2  Vital Signs: Temp: 98.7 F (37.1 C) (02/18 2000) Temp Source: Oral (02/18 2000) BP: 111/83 (02/19 0000) Pulse Rate: 67 (02/18 2000)  Labs: Recent Labs    10/11/20 1425 10/11/20 1443 10/11/20 1740 10/11/20 2347 10/12/20 0646 10/12/20 1439 10/13/20 0429  HGB 14.4  --   --  13.5  --   --  12.7  HCT 44.6  --   --  39.1  --   --  36.8  PLT 136*  --   --  132*  --   --  146*  HEPARINUNFRC  --    < >  --  0.83* 0.84* 0.53 0.69  CREATININE 0.91  --   --  0.91  --   --   --   TROPONINIHS 7  --  6  --   --   --   --    < > = values in this interval not displayed.    Estimated Creatinine Clearance: 56.1 mL/min (by C-G formula based on SCr of 0.91 mg/dL).   Assessment: 75 yo female presenting with extensive biltateral PE with RHS (RV/LV ratio 1.4), acute RLE DVt and likely intra-atrial septum clot on ECHO.  No AC noted PTA.  Pharmacy consulted to dose heparin.    Heparin level therapeutic (0.69), but at higher end of range. Hgb and Hct down slightly but still within normal. No active bleed issues reported.  Goal of Therapy:  Heparin level 0.3-0.7 units/ml Monitor platelets by anticoagulation protocol: Yes   Plan:  Reduce heparin to 900 units/hr Check 6 hour heparin level Monitor daily CBC, s/sx bleeding F/u transition to PO anticoagulant   Norina Buzzard, PharmD PGY1 Pharmacy Resident 10/13/2020 7:32 AM   Addendum: Consulted to transition to apixaban. Has been on therapeutic heparin for around 36 hours. Based on clot burden, will do full treatment duration of apixaban.  Plan: Start apixaban 10mg  BID PO x7 days followed by 5mg  BID PO Follow CBCs and monitor for signs of bleeding

## 2020-10-13 NOTE — Progress Notes (Signed)
PROGRESS NOTE    Sarah Valdez  IOX:[id number] DOB: April 22, 1946 DOA: 10/11/2020 PCP: Shon Baton, MD  Brief Narrative: 75 year old female history of cryptogenic stroke,, dyslipidemia was sent to the ED due to abnormal chest CT concerning for extensive PE. -, Patient had radiofrequency ablation procedure to her lumbar spine in mid January and early February, since then has been a little less mobile than before but not significantly, 2 weeks ago noted mild right leg/calf discomfort and dyspnea with exertion associated with cough, this persisted, she then saw her PCP for it who ordered a D-dimer followed by extensive bilateral pulmonary emboli with right heart strain and an area of pulmonary infarction she was then sent to the ED. -Started on IV heparin, remained hemodynamically stable  Assessment & Plan:   Extensive bilateral pulmonary embolism Extensive right lower extremity DVT -It appears that this was likely unprovoked -Completed 48 hours of IV heparin will transition to Eliquis today -2D echocardiogram notes mild RV dilation, preserved EF and RV function, in addition echo reports echodensity adjacent to the intra-atrial septum. Cannot  determine etiology of mass, but given extensive pulmonary embolism burden,  concern would be for clot, reviewed this with cardiology MD today, also suspects could be right atrial clot secondary to PE, agreed with current management -Recommend repeat echocardiogram in 2 to 3 months time -Will refer to hematology at discharge for follow-up  History of cryptogenic strokes -Has loop recorder which not reveal A. Fib -Stop aspirin while on anticoagulation  Hypertension -Continue Cardizem   DVT prophylaxis: Eliquis Code Status: Full code Family Communication: Spouse at bedside Disposition Plan:  Status is: Inpatient  Remains inpatient appropriate because:Inpatient level of care appropriate due to severity of illness   Dispo: The patient is from:  Home              Anticipated d/c is to: Home              Anticipated d/c date is: Tomorrow              Patient currently is not medically stable to d/c.   Difficult to place patient No   Consultants:   Will d/w Cards   Procedures:   Antimicrobials:    Subjective: -Feels okay, reports some dyspnea on exertion, denies any dizziness or chest pain  Objective: Vitals:   10/13/20 0000 10/13/20 0400 10/13/20 1003 10/13/20 1100  BP: 111/83  138/87 121/71  Pulse:    76  Resp:    17  Temp:    (!) 97.5 F (36.4 C)  TempSrc:    Oral  SpO2:   96% 94%  Weight:  76.7 kg    Height:        Intake/Output Summary (Last 24 hours) at 10/13/2020 1328 Last data filed at 10/13/2020 1005 Gross per 24 hour  Intake 483 ml  Output --  Net 483 ml   Filed Weights   10/12/20 1004 10/12/20 1030 10/13/20 0400  Weight: 76.2 kg 76.2 kg 76.7 kg    Examination:  General exam: Pleasant female sitting up in bed, appears much younger than stated age, AAOx3, no distress CVS: S1-S2, regular rate rhythm Lungs: Decreased breath sounds the bases, otherwise clear Abdomen: Soft, nontender, bowel sounds present Extremities: No edema Skin: No rashes on exposed skin   Data Reviewed:   CBC: Recent Labs  Lab 10/11/20 1425 10/11/20 2347 10/13/20 0429  WBC 10.0 9.7 7.6  NEUTROABS 7.2  --   --   HGB 14.4  13.5 12.7  HCT 44.6 39.1 36.8  MCV 99.1 95.4 96.8  PLT 136* 132* 366*   Basic Metabolic Panel: Recent Labs  Lab 10/11/20 1425 10/11/20 2347  NA 139 139  K 3.5 3.6  CL 110 109  CO2 20* 20*  GLUCOSE 92 103*  BUN 22 24*  CREATININE 0.91 0.91  CALCIUM 8.7* 8.4*   GFR: Estimated Creatinine Clearance: 56.1 mL/min (by C-G formula based on SCr of 0.91 mg/dL). Liver Function Tests: Recent Labs  Lab 10/11/20 1425  AST 23  ALT 28  ALKPHOS 46  BILITOT 0.7  PROT 6.5  ALBUMIN 3.8   No results for input(s): LIPASE, AMYLASE in the last 168 hours. No results for input(s): AMMONIA in the  last 168 hours. Coagulation Profile: No results for input(s): INR, PROTIME in the last 168 hours. Cardiac Enzymes: No results for input(s): CKTOTAL, CKMB, CKMBINDEX, TROPONINI in the last 168 hours. BNP (last 3 results) No results for input(s): PROBNP in the last 8760 hours. HbA1C: No results for input(s): HGBA1C in the last 72 hours. CBG: No results for input(s): GLUCAP in the last 168 hours. Lipid Profile: No results for input(s): CHOL, HDL, LDLCALC, TRIG, CHOLHDL, LDLDIRECT in the last 72 hours. Thyroid Function Tests: No results for input(s): TSH, T4TOTAL, FREET4, T3FREE, THYROIDAB in the last 72 hours. Anemia Panel: No results for input(s): VITAMINB12, FOLATE, FERRITIN, TIBC, IRON, RETICCTPCT in the last 72 hours. Urine analysis:    Component Value Date/Time   COLORURINE YELLOW 12/04/2009 [place] 12/04/2009 0834   LABSPEC >=1.030 12/04/2009 0834   PHURINE 5.5 12/04/2009 0834   GLUCOSEU NEGATIVE 12/04/2009 0834   HGBUR small 09/08/2007 0908   BILIRUBINUR negative 06/10/2013 1433   KETONESUR NEGATIVE 12/04/2009 0834   PROTEINUR negative 06/10/2013 1433   UROBILINOGEN 0.2 06/10/2013 1433   UROBILINOGEN 0.2 12/04/2009 0834   NITRITE negative 06/10/2013 1433   NITRITE NEGATIVE 12/04/2009 0834   LEUKOCYTESUR Negative 06/10/2013 1433   Sepsis Labs: @LABRCNTIP (procalcitonin:4,lacticidven:4)  ) Recent Results (from the past 240 hour(s))  Resp Panel by RT-PCR (Flu A&B, Covid) Nasopharyngeal Swab     Status: None   Collection Time: 10/11/20  2:25 PM   Specimen: Nasopharyngeal Swab; Nasopharyngeal(NP) swabs in vial transport medium  Result Value Ref Range Status   SARS Coronavirus 2 by RT PCR NEGATIVE NEGATIVE Final    Comment: (NOTE) SARS-CoV-2 target nucleic acids are NOT DETECTED.  The SARS-CoV-2 RNA is generally detectable in upper respiratory specimens during the acute phase of infection. The lowest concentration of SARS-CoV-2 viral copies this assay  can detect is 138 copies/mL. A negative result does not preclude SARS-Cov-2 infection and should not be used as the sole basis for treatment or other patient management decisions. A negative result may occur with  improper specimen collection/handling, submission of specimen other than nasopharyngeal swab, presence of viral mutation(s) within the areas targeted by this assay, and inadequate number of viral copies(<138 copies/mL). A negative result must be combined with clinical observations, patient history, and epidemiological information. The expected result is Negative.  Fact Sheet for Patients:  EntrepreneurPulse.com.au  Fact Sheet for Healthcare Providers:  IncredibleEmployment.be  This test is no t yet approved or cleared by the [place] FDA and  has been authorized for detection and/or diagnosis of SARS-CoV-2 by FDA under an Emergency Use Authorization (EUA). This EUA will remain  in effect (meaning this test can be used) for the duration of the COVID-19 declaration under Section 564(b)(1) of the Act,  21 U.S.C.section 360bbb-3(b)(1), unless the authorization is terminated  or revoked sooner.       Influenza A by PCR NEGATIVE NEGATIVE Final   Influenza B by PCR NEGATIVE NEGATIVE Final    Comment: (NOTE) The Xpert Xpress SARS-CoV-2/FLU/RSV plus assay is intended as an aid in the diagnosis of influenza from Nasopharyngeal swab specimens and should not be used as a sole basis for treatment. Nasal washings and aspirates are unacceptable for Xpert Xpress SARS-CoV-2/FLU/RSV testing.  Fact Sheet for Patients: EntrepreneurPulse.com.au  Fact Sheet for Healthcare Providers: IncredibleEmployment.be  This test is not yet approved or cleared by the [place] FDA and has been authorized for detection and/or diagnosis of SARS-CoV-2 by FDA under an Emergency Use Authorization (EUA). This EUA will  remain in effect (meaning this test can be used) for the duration of the COVID-19 declaration under Section 564(b)(1) of the Act, 21 U.S.C. section 360bbb-3(b)(1), unless the authorization is terminated or revoked.  Performed at [facility], [place] [address], [place], [place] [address]          Radiology Studies: VAS [place] LOWER EXTREMITY VENOUS (DVT) (ONLY [facility] & [facility] 7a-7p)  Result Date: 10/11/2020  Lower Venous DVT Study Indications: Pain, and pulmonary embolism.  Comparison Study: No prior study on file Performing Technologist: Sharion Dove RVS  Examination Guidelines: A complete evaluation includes B-mode imaging, spectral Doppler, color Doppler, and power Doppler as needed of all accessible portions of each vessel. Bilateral testing is considered an integral part of a complete examination. Limited examinations for reoccurring indications may be performed as noted. The reflux portion of the exam is performed with the patient in reverse Trendelenburg.  +---------+---------------+---------+-----------+----------+--------------+ RIGHT    CompressibilityPhasicitySpontaneityPropertiesThrombus Aging +---------+---------------+---------+-----------+----------+--------------+ CFV      Partial                                      Acute          +---------+---------------+---------+-----------+----------+--------------+ SFJ      Full                                                        +---------+---------------+---------+-----------+----------+--------------+ FV Prox  None                                         Acute          +---------+---------------+---------+-----------+----------+--------------+ FV Mid   None                                         Acute          +---------+---------------+---------+-----------+----------+--------------+ FV DistalNone                                         Acute           +---------+---------------+---------+-----------+----------+--------------+ PFV      Full                                                        +---------+---------------+---------+-----------+----------+--------------+  POP      None           No       No                   Acute          +---------+---------------+---------+-----------+----------+--------------+ PTV      Full                                                        +---------+---------------+---------+-----------+----------+--------------+ PERO     Full                                                        +---------+---------------+---------+-----------+----------+--------------+ Gastroc  None                                         Acute          +---------+---------------+---------+-----------+----------+--------------+   +----+---------------+---------+-----------+----------+--------------+ LEFTCompressibilityPhasicitySpontaneityPropertiesThrombus Aging +----+---------------+---------+-----------+----------+--------------+ CFV Full           Yes      Yes                                 +----+---------------+---------+-----------+----------+--------------+     Summary: RIGHT: - Findings consistent with acute deep vein thrombosis involving the right common femoral vein, right femoral vein, right popliteal vein, and right gastrocnemius veins.   *See table(s) above for measurements and observations. Electronically signed by Monica Martinez MD on 10/11/2020 at 4:35:17 PM.    Final    ECHOCARDIOGRAM LIMITED  Result Date: 10/11/2020    ECHOCARDIOGRAM LIMITED REPORT   Patient Name:   Sarah Valdez Date of Exam: 10/11/2020 Medical Rec #:  [id number]      Height:       67.0 in Accession #:    [id number]     Weight:       168.0 lb Date of Birth:  [birth year]     BSA:          1.878 m Patient Age:    39 years       BP:           120/104 mmHg Patient Gender: F              HR:           67 bpm. Exam  Location:  Inpatient Procedure: Limited Echo, Limited Color Doppler and Cardiac Doppler                        STAT ECHO  Reported to: Dr. Harrell Gave on 10/11/2020 4:52:00 PM Communicated via amion message to Dr. Lorin Mercy at 5:43 PM. Indications:     I26.02 Pulmonary embolus  History:         Patient has prior history of Echocardiogram examinations, most                  recent 01/12/2017. Stroke; Risk Factors:Dyslipidemia. Cancer.  Sonographer:  [place] Referring Phys:  [place] Diagnosing Phys: Buford Dresser MD IMPRESSIONS  1. Left ventricular ejection fraction, by estimation, is 55 to 60%. The left ventricle has normal function. The left ventricle has no regional wall motion abnormalities. Left ventricular diastolic function could not be evaluated.  2. Right ventricular systolic function is normal. The right ventricular size is mildly enlarged. There is normal pulmonary artery systolic pressure.  3. There is an echodensity adjacent to the intra-atrial septum. Cannot determine etiology of mass, but given extensive pulmonary embolism burden, concern would be for clot. Consider reimaging in the future vs additional imaging modality.  4. The mitral valve is grossly normal. Trivial mitral valve regurgitation. No evidence of mitral stenosis. Moderate mitral annular calcification.  5. The aortic valve is tricuspid. Aortic valve regurgitation is not visualized. No aortic stenosis is present.  6. The inferior vena cava is normal in size with greater than 50% respiratory variability, suggesting right atrial pressure of 3 mmHg. Comparison(s): No prior Echocardiogram. Conclusion(s)/Recommendation(s): Normal LV fuction, mildly enlarged RV size. Echodensity adjacent to intra-atrial septum, see comments. FINDINGS  Left Ventricle: Left ventricular ejection fraction, by estimation, is 55 to 60%. The left ventricle has normal function. The left ventricle has no regional wall motion abnormalities. There is  no left ventricular hypertrophy. Left ventricular diastolic function could not be evaluated. Right Ventricle: The right ventricular size is mildly enlarged. Right vetricular wall thickness was not well visualized. Right ventricular systolic function is normal. There is normal pulmonary artery systolic pressure. The tricuspid regurgitant velocity  is 2.62 m/s, and with an assumed right atrial pressure of 3 mmHg, the estimated right ventricular systolic pressure is 19.4 mmHg. Left Atrium: Left atrial size was normal in size. Right Atrium: Right atrial size was normal in size. Pericardium: Trivial pericardial effusion is present. Presence of pericardial fat pad. Mitral Valve: The mitral valve is grossly normal. There is mild calcification of the mitral valve leaflet(s). Moderate mitral annular calcification. Trivial mitral valve regurgitation. No evidence of mitral valve stenosis. Tricuspid Valve: The tricuspid valve is normal in structure. Tricuspid valve regurgitation is mild . No evidence of tricuspid stenosis. Aortic Valve: The aortic valve is tricuspid. There is mild to moderate aortic valve annular calcification. Aortic valve regurgitation is not visualized. No aortic stenosis is present. Pulmonic Valve: The pulmonic valve was grossly normal. Pulmonic valve regurgitation is mild to moderate. No evidence of pulmonic stenosis. Aorta: The aortic root, ascending aorta and aortic arch are all structurally normal, with no evidence of dilitation or obstruction. Venous: The inferior vena cava is normal in size with greater than 50% respiratory variability, suggesting right atrial pressure of 3 mmHg. IAS/Shunts: The atrial septum is grossly normal. LEFT VENTRICLE PLAX 2D LVIDd:         3.70 cm LVIDs:         2.60 cm LV PW:         0.90 cm LV IVS:        1.00 cm LVOT diam:     1.80 cm LV SV:         47 LV SV Index:   25 LVOT Area:     2.54 cm  RIGHT VENTRICLE             IVC RV Basal diam:  2.40 cm     IVC diam: 1.80 cm  RV S prime:     12.70 cm/s TAPSE (M-mode): 1.8 cm LEFT ATRIUM  Index       RIGHT ATRIUM           Index LA diam:      2.90 cm 1.54 cm/m  RA Area:     10.70 cm LA Vol (A2C): 31.9 ml 16.99 ml/m RA Volume:   25.20 ml  13.42 ml/m LA Vol (A4C): 17.7 ml 9.43 ml/m  AORTIC VALVE LVOT Vmax:   88.70 cm/s LVOT Vmean:  60.500 cm/s LVOT VTI:    0.183 m  AORTA Ao Root diam: 2.80 cm Ao Asc diam:  3.30 cm MITRAL VALVE               TRICUSPID VALVE MV Area (PHT): 2.54 cm    TR Peak grad:   27.5 mmHg MV Decel Time: 299 msec    TR Vmax:        262.00 cm/s MV E velocity: 52.40 cm/s MV A velocity: 72.30 cm/s  SHUNTS MV E/A ratio:  0.72        Systemic VTI:  0.18 m                            Systemic Diam: 1.80 cm Buford Dresser MD Electronically signed by Buford Dresser MD Signature Date/Time: 10/11/2020/5:38:30 PM    Final (Updated)         Scheduled Meds: . apixaban  10 mg Oral BID   Followed by  . [START ON 10/20/2020] apixaban  5 mg Oral BID  . diltiazem  120 mg Oral Daily  . docusate sodium  100 mg Oral BID  . loratadine  10 mg Oral Daily  . pravastatin  40 mg Oral Daily  . sodium chloride flush  3 mL Intravenous Q12H   Continuous Infusions:    LOS: 2 days    Time spent: 55min  Domenic Polite, MD [facility]  10/13/2020, 1:28 PM

## 2020-10-14 DIAGNOSIS — I2609 Other pulmonary embolism with acute cor pulmonale: Secondary | ICD-10-CM | POA: Diagnosis not present

## 2020-10-14 DIAGNOSIS — I2699 Other pulmonary embolism without acute cor pulmonale: Secondary | ICD-10-CM

## 2020-10-14 LAB — BASIC METABOLIC PANEL
Anion gap: 8 (ref 5–15)
Anion gap: 10 (ref 5–15)
BUN: 17 mg/dL (ref 8–23)
BUN: 17 mg/dL (ref 8–23)
CO2: 26 mmol/L (ref 22–32)
CO2: 18 mmol/L — ABNORMAL LOW (ref 22–32)
Calcium: 9.1 mg/dL (ref 8.9–10.3)
Calcium: 8.9 mg/dL (ref 8.9–10.3)
Chloride: 108 mmol/L (ref 98–111)
Chloride: 109 mmol/L (ref 98–111)
Creatinine, Ser: 0.92 mg/dL (ref 0.44–1.00)
Creatinine, Ser: 0.91 mg/dL (ref 0.44–1.00)
GFR, Estimated: >60 mL/min (ref >60)
GFR, Estimated: >60 mL/min (ref >60)
Glucose, Bld: 96 mg/dL (ref 70–99)
Glucose, Bld: 140 mg/dL — ABNORMAL HIGH (ref 70–99)
Potassium: 5.2 mmol/L — ABNORMAL HIGH (ref 3.5–5.1)
Potassium: 3.5 mmol/L (ref 3.5–5.1)
Sodium: 142 mmol/L (ref 135–145)
Sodium: 137 mmol/L (ref 135–145)

## 2020-10-14 LAB — CBC
HCT: 41.3 % (ref 36.0–46.0)
Hemoglobin: 13.3 g/dL (ref 12.0–15.0)
MCH: 31.9 pg (ref 26.0–34.0)
MCHC: 32.2 g/dL (ref 30.0–36.0)
MCV: 99 fL (ref 80.0–100.0)
Platelets: 167 10*3/uL (ref 150–400)
RBC: 4.17 MIL/uL (ref 3.87–5.11)
RDW: 14.1 % (ref 11.5–15.5)
WBC: 8 10*3/uL (ref 4.0–10.5)
nRBC: 0 % (ref 0.0–0.2)

## 2020-10-14 NOTE — Discharge Summary (Signed)
Physician Discharge Summary  JAIDEN WAHAB DGU:[id number] DOB: 20-Feb-1946 DOA: 10/11/2020  PCP: Shon Baton, MD  Admit date: 10/11/2020 Discharge date: 10/14/2020  Time spent: 35 minutes  Recommendations for Outpatient Follow-up:  1. PCP Dr. Virgina Jock in 1 week 2. May need further assistance with Eliquis 3. Hematology follow-up in 2 months 4. Repeat echocardiogram in 2 to 3 months   Discharge Diagnoses:  Extensive bilateral pulmonary embolism Suspected right atrial clot Extensive right lower extremity DVT   Dyslipidemia   Cryptogenic stroke [facility])   Essential hypertension   Discharge Condition: Stable  Diet recommendation: Low-sodium  Filed Weights   10/12/20 1030 10/13/20 0400 10/14/20 0418  Weight: 76.2 kg 76.7 kg 75.8 kg    History of present illness:   75 year old female history of cryptogenic stroke,, dyslipidemia was sent to the ED due to abnormal chest CT concerning for extensive PE. -, Patient had radiofrequency ablation procedure to her lumbar spine in mid January and early February, since then has been a little less mobile than before but not significantly, 2 weeks ago noted mild right leg/calf discomfort and dyspnea with exertion associated with cough, this persisted, she then saw her PCP for it who ordered a D-dimer followed by extensive bilateral pulmonary emboli with right heart strain and an area of pulmonary infarction she was then sent to the ED.  Hospital Course:   Extensive bilateral pulmonary embolism Extensive right lower extremity DVT -It appears that this was likely unprovoked -Completed 48 hours of IV heparin, subsequently transitioned to oral Eliquis -2D echocardiogram notes mild RV dilation, preserved EF and RV function, in addition echo reports echodensity adjacent to the intra-atrial septum, given extensive pulmonary embolism burden, concern would be for clot, reviewed this with cardiology MD on call, also suspects could be right atrial clot secondary  to PE, agreed with current management. -Patient is recommended to continue anticoagulation long-term -Referral sent to hematology for hypercoagulable work-up -Recommend repeat echocardiogram in 2 to 3 months time -She was given a 30-day free card for Eliquis at discharge  History of cryptogenic strokes -Has loop recorder which not reveal A. Fib -Stop aspirin while on anticoagulation  Hypertension -Continue Cardizem  Discharge Exam: Vitals:   10/13/20 2023 10/14/20 0418  BP:  133/88  Pulse:  79  Resp:  16  Temp: 98.3 F (36.8 C) 98.6 F (37 C)  SpO2:  94%    General: Awake alert oriented x3, no distress Cardiovascular: S1-S2, regular rate rhythm Respiratory: Clear bilaterally  Discharge Instructions   Discharge Instructions    Ambulatory referral to Hematology   Complete by: As directed    Diet - low sodium heart healthy   Complete by: As directed    Increase activity slowly   Complete by: As directed      Allergies as of 10/14/2020      Reactions   Morphine Other (See Comments)   Respiratory compromise post op   Penicillins Rash   Has patient had a PCN reaction causing immediate rash, facial/tongue/throat swelling, SOB or lightheadedness with hypotension: No Has patient had a PCN reaction causing severe rash involving mucus membranes or skin necrosis: No Has patient had a PCN reaction that required hospitalization: No Has patient had a PCN reaction occurring within the last 10 years: No If all of the above answers are "NO", then may proceed with Cephalosporin use. urticaria   Levofloxacin Other (See Comments)   SHOULDER PAIN      Medication List    STOP taking these  medications   aspirin 325 MG tablet     TAKE these medications   alendronate 70 MG tablet Commonly known as: FOSAMAX Take 70 mg by mouth once a week. Thursdays   Apixaban Starter Pack (10mg  and 5mg ) Commonly known as: ELIQUIS STARTER PACK Take as directed on package: start with two-5mg   tablets twice daily for 7 days. On day 8, switch to one-5mg  tablet twice daily.   cetirizine 10 MG tablet Commonly known as: ZYRTEC Take 10 mg by mouth daily.   diltiazem 120 MG tablet Commonly known as: CARDIZEM Take 1 tablet (120 mg total) by mouth daily. Please keep upcoming appt in January 2022 with Dr. Rayann Heman before anymore refills. Thank you   pravastatin 40 MG tablet Commonly known as: PRAVACHOL Take 40 mg by mouth daily.   Vitamin D 50 MCG (2000 UT) Caps Take 1 capsule at bedtime by mouth.      Allergies  Allergen Reactions  . Morphine Other (See Comments)    Respiratory compromise post op  . Penicillins Rash    Has patient had a PCN reaction causing immediate rash, facial/tongue/throat swelling, SOB or lightheadedness with hypotension: No Has patient had a PCN reaction causing severe rash involving mucus membranes or skin necrosis: No Has patient had a PCN reaction that required hospitalization: No Has patient had a PCN reaction occurring within the last 10 years: No If all of the above answers are "NO", then may proceed with Cephalosporin use. urticaria  . Levofloxacin Other (See Comments)     SHOULDER PAIN    Follow-up Information    Shon Baton, MD. Schedule an appointment as soon as possible for a visit in 1 week(s).   Specialty: Internal Medicine Contact information: [place] [address] [phone]        Thompson Grayer, MD Follow up in 2 day(s).   Specialty: Cardiology Why: need ECHO in 2-3 months Contact information: [place] [address] [phone]        Hematology Follow up in 2 month(s).   Why: Office will call with FU               The results of significant diagnostics from this hospitalization (including imaging, microbiology, ancillary and laboratory) are listed below for reference.    Significant Diagnostic Studies: CT ANGIO CHEST PE W OR WO CONTRAST  Result Date:  10/11/2020 CLINICAL DATA:  Shortness of breath, elevated D-dimer EXAM: CT ANGIOGRAPHY CHEST WITH CONTRAST TECHNIQUE: Multidetector CT imaging of the chest was performed using the standard protocol during bolus administration of intravenous contrast. Multiplanar CT image reconstructions and MIPs were obtained to evaluate the vascular anatomy. CONTRAST:  1mL ISOVUE-370 IOPAMIDOL (ISOVUE-370) INJECTION 76% COMPARISON:  Chest x-ray 11/12/2009 FINDINGS: Cardiovascular: Satisfactory opacification of the pulmonary arteries. Extensive bilateral pulmonary emboli with thrombus present within both the right and left main pulmonary artery with extension into the lobar branches and multiple segmental branches. No saddle embolism. RV to LV ratio of 1.4 indicative of right heart strain. Heart size is normal. No pericardial effusion. Thoracic aorta is nonaneurysmal. Three vessel arch. Mediastinum/Nodes: No enlarged mediastinal, hilar, or axillary lymph nodes. Thyroid gland, trachea, and esophagus demonstrate no significant findings. Lungs/Pleura: Small wedge-shaped opacity in the periphery of the left upper lobe anteriorly (series 12, image 92) which may reflect an area of pulmonary infarction. Elsewhere, lungs are clear. No pleural effusion or pneumothorax. Upper Abdomen: Suspect cholelithiasis within the partially imaged gallbladder. Musculoskeletal: Lower left chest wall  implanted loop recorder. Left worse than right glenohumeral joint osteoarthritis. Advanced degenerative disc disease of T12-L1. No acute osseous findings. Review of the MIP images confirms the above findings. IMPRESSION: 1. Extensive bilateral pulmonary emboli with evidence of right heart strain (RV/LV Ratio 1.4) consistent with at least submassive (intermediate risk) PE. The presence of right heart strain has been associated with an increased risk of morbidity and mortality. 2. Small wedge-shaped opacity in the periphery of the left upper lobe anteriorly may  reflect an area of pulmonary infarction. 3. Suspect cholelithiasis. Critical Value/emergent results were called by telephone at the time of interpretation on 10/11/2020 at 12:31 pm to provider Dr Shon Baton, who verbally acknowledged these results. Electronically Signed   By: Davina Poke D.O.   On: 10/11/2020 12:32   MM 3D SCREEN BREAST BILATERAL  Result Date: 10/08/2020 CLINICAL DATA:  Screening. EXAM: DIGITAL SCREENING BILATERAL MAMMOGRAM WITH TOMOSYNTHESIS AND CAD TECHNIQUE: Bilateral screening digital craniocaudal and mediolateral oblique mammograms were obtained. Bilateral screening digital breast tomosynthesis was performed. The images were evaluated with computer-aided detection. COMPARISON:  Previous exam(s). ACR Breast Density Category b: There are scattered areas of fibroglandular density. FINDINGS: There are no findings suspicious for malignancy. IMPRESSION: No mammographic evidence of malignancy. A result letter of this screening mammogram will be mailed directly to the patient. RECOMMENDATION: Screening mammogram in one year. (Code:SM-B-01Y) BI-RADS CATEGORY  1: Negative. Electronically Signed   By: Nolon Nations M.D.   On: 10/08/2020 10:23   CUP PACEART INCLINIC DEVICE CHECK  Result Date: 09/24/2020 Loop check in clinic. Battery status: Good. R-waves 0.35mV. 0 symptom episodes, 0 tachy episodes, 0 pause episodes, 0 brady episodes. 0 AF episodes (0.0 % burden). Patient is enrolled in remote monitoring, next scheduled summary report on 10/22/20.Trena Platt, BSN, RN  VAS [place] LOWER EXTREMITY VENOUS (DVT) (ONLY [facility] & [facility] 7a-7p)  Result Date: 10/11/2020  Lower Venous DVT Study Indications: Pain, and pulmonary embolism.  Comparison Study: No prior study on file Performing Technologist: Sharion Dove RVS  Examination Guidelines: A complete evaluation includes B-mode imaging, spectral Doppler, color Doppler, and power Doppler as needed of all accessible portions of each vessel. Bilateral testing  is considered an integral part of a complete examination. Limited examinations for reoccurring indications may be performed as noted. The reflux portion of the exam is performed with the patient in reverse Trendelenburg.  +---------+---------------+---------+-----------+----------+--------------+ RIGHT    CompressibilityPhasicitySpontaneityPropertiesThrombus Aging +---------+---------------+---------+-----------+----------+--------------+ CFV      Partial                                      Acute          +---------+---------------+---------+-----------+----------+--------------+ SFJ      Full                                                        +---------+---------------+---------+-----------+----------+--------------+ FV Prox  None                                         Acute          +---------+---------------+---------+-----------+----------+--------------+ FV Mid   None  Acute          +---------+---------------+---------+-----------+----------+--------------+ FV DistalNone                                         Acute          +---------+---------------+---------+-----------+----------+--------------+ PFV      Full                                                        +---------+---------------+---------+-----------+----------+--------------+ POP      None           No       No                   Acute          +---------+---------------+---------+-----------+----------+--------------+ PTV      Full                                                        +---------+---------------+---------+-----------+----------+--------------+ PERO     Full                                                        +---------+---------------+---------+-----------+----------+--------------+ Gastroc  None                                         Acute           +---------+---------------+---------+-----------+----------+--------------+   +----+---------------+---------+-----------+----------+--------------+ LEFTCompressibilityPhasicitySpontaneityPropertiesThrombus Aging +----+---------------+---------+-----------+----------+--------------+ CFV Full           Yes      Yes                                 +----+---------------+---------+-----------+----------+--------------+     Summary: RIGHT: - Findings consistent with acute deep vein thrombosis involving the right common femoral vein, right femoral vein, right popliteal vein, and right gastrocnemius veins.   *See table(s) above for measurements and observations. Electronically signed by Monica Martinez MD on 10/11/2020 at 4:35:17 PM.    Final    ECHOCARDIOGRAM LIMITED  Result Date: 10/11/2020    ECHOCARDIOGRAM LIMITED REPORT   Patient Name:   JONIYA BOBERG Date of Exam: 10/11/2020 Medical Rec #:  [id number]      Height:       67.0 in Accession #:    [id number]     Weight:       168.0 lb Date of Birth:  October 15, 1945     BSA:          1.878 m Patient Age:    75 years       BP:           120/104 mmHg Patient Gender: F  HR:           67 bpm. Exam Location:  Inpatient Procedure: Limited Echo, Limited Color Doppler and Cardiac Doppler                        STAT ECHO  Reported to: Dr. Harrell Gave on 10/11/2020 4:52:00 PM Communicated via amion message to Dr. Lorin Mercy at 5:43 PM. Indications:     I26.02 Pulmonary embolus  History:         Patient has prior history of Echocardiogram examinations, most                  recent 01/12/2017. Stroke; Risk Factors:Dyslipidemia. Cancer.  Sonographer:     Jonelle Sidle Dance Referring Phys:  [place] Diagnosing Phys: Buford Dresser MD IMPRESSIONS  1. Left ventricular ejection fraction, by estimation, is 55 to 60%. The left ventricle has normal function. The left ventricle has no regional wall motion abnormalities. Left ventricular diastolic function  could not be evaluated.  2. Right ventricular systolic function is normal. The right ventricular size is mildly enlarged. There is normal pulmonary artery systolic pressure.  3. There is an echodensity adjacent to the intra-atrial septum. Cannot determine etiology of mass, but given extensive pulmonary embolism burden, concern would be for clot. Consider reimaging in the future vs additional imaging modality.  4. The mitral valve is grossly normal. Trivial mitral valve regurgitation. No evidence of mitral stenosis. Moderate mitral annular calcification.  5. The aortic valve is tricuspid. Aortic valve regurgitation is not visualized. No aortic stenosis is present.  6. The inferior vena cava is normal in size with greater than 50% respiratory variability, suggesting right atrial pressure of 3 mmHg. Comparison(s): No prior Echocardiogram. Conclusion(s)/Recommendation(s): Normal LV fuction, mildly enlarged RV size. Echodensity adjacent to intra-atrial septum, see comments. FINDINGS  Left Ventricle: Left ventricular ejection fraction, by estimation, is 55 to 60%. The left ventricle has normal function. The left ventricle has no regional wall motion abnormalities. There is no left ventricular hypertrophy. Left ventricular diastolic function could not be evaluated. Right Ventricle: The right ventricular size is mildly enlarged. Right vetricular wall thickness was not well visualized. Right ventricular systolic function is normal. There is normal pulmonary artery systolic pressure. The tricuspid regurgitant velocity  is 2.62 m/s, and with an assumed right atrial pressure of 3 mmHg, the estimated right ventricular systolic pressure is 86.7 mmHg. Left Atrium: Left atrial size was normal in size. Right Atrium: Right atrial size was normal in size. Pericardium: Trivial pericardial effusion is present. Presence of pericardial fat pad. Mitral Valve: The mitral valve is grossly normal. There is mild calcification of the mitral  valve leaflet(s). Moderate mitral annular calcification. Trivial mitral valve regurgitation. No evidence of mitral valve stenosis. Tricuspid Valve: The tricuspid valve is normal in structure. Tricuspid valve regurgitation is mild . No evidence of tricuspid stenosis. Aortic Valve: The aortic valve is tricuspid. There is mild to moderate aortic valve annular calcification. Aortic valve regurgitation is not visualized. No aortic stenosis is present. Pulmonic Valve: The pulmonic valve was grossly normal. Pulmonic valve regurgitation is mild to moderate. No evidence of pulmonic stenosis. Aorta: The aortic root, ascending aorta and aortic arch are all structurally normal, with no evidence of dilitation or obstruction. Venous: The inferior vena cava is normal in size with greater than 50% respiratory variability, suggesting right atrial pressure of 3 mmHg. IAS/Shunts: The atrial septum is grossly normal. LEFT VENTRICLE PLAX 2D LVIDd:  3.70 cm LVIDs:         2.60 cm LV PW:         0.90 cm LV IVS:        1.00 cm LVOT diam:     1.80 cm LV SV:         47 LV SV Index:   25 LVOT Area:     2.54 cm  RIGHT VENTRICLE             IVC RV Basal diam:  2.40 cm     IVC diam: 1.80 cm RV S prime:     12.70 cm/s TAPSE (M-mode): 1.8 cm LEFT ATRIUM           Index       RIGHT ATRIUM           Index LA diam:      2.90 cm 1.54 cm/m  RA Area:     10.70 cm LA Vol (A2C): 31.9 ml 16.99 ml/m RA Volume:   25.20 ml  13.42 ml/m LA Vol (A4C): 17.7 ml 9.43 ml/m  AORTIC VALVE LVOT Vmax:   88.70 cm/s LVOT Vmean:  60.500 cm/s LVOT VTI:    0.183 m  AORTA Ao Root diam: 2.80 cm Ao Asc diam:  3.30 cm MITRAL VALVE               TRICUSPID VALVE MV Area (PHT): 2.54 cm    TR Peak grad:   27.5 mmHg MV Decel Time: 299 msec    TR Vmax:        262.00 cm/s MV E velocity: 52.40 cm/s MV A velocity: 72.30 cm/s  SHUNTS MV E/A ratio:  0.72        Systemic VTI:  0.18 m                            Systemic Diam: 1.80 cm Buford Dresser MD Electronically  signed by Buford Dresser MD Signature Date/Time: 10/11/2020/5:38:30 PM    Final (Updated)     Microbiology: Recent Results (from the past 240 hour(s))  Resp Panel by RT-PCR (Flu A&B, Covid) Nasopharyngeal Swab     Status: None   Collection Time: 10/11/20  2:25 PM   Specimen: Nasopharyngeal Swab; Nasopharyngeal(NP) swabs in vial transport medium  Result Value Ref Range Status   SARS Coronavirus 2 by RT PCR NEGATIVE NEGATIVE Final    Comment: (NOTE) SARS-CoV-2 target nucleic acids are NOT DETECTED.  The SARS-CoV-2 RNA is generally detectable in upper respiratory specimens during the acute phase of infection. The lowest concentration of SARS-CoV-2 viral copies this assay can detect is 138 copies/mL. A negative result does not preclude SARS-Cov-2 infection and should not be used as the sole basis for treatment or other patient management decisions. A negative result may occur with  improper specimen collection/handling, submission of specimen other than nasopharyngeal swab, presence of viral mutation(s) within the areas targeted by this assay, and inadequate number of viral copies(<138 copies/mL). A negative result must be combined with clinical observations, patient history, and epidemiological information. The expected result is Negative.  Fact Sheet for Patients:  EntrepreneurPulse.com.au  Fact Sheet for Healthcare Providers:  IncredibleEmployment.be  This test is no t yet approved or cleared by the [place] FDA and  has been authorized for detection and/or diagnosis of SARS-CoV-2 by FDA under an Emergency Use Authorization (EUA). This EUA will remain  in effect (meaning this test can be used) for  the duration of the COVID-19 declaration under Section 564(b)(1) of the Act, 21 U.S.C.section 360bbb-3(b)(1), unless the authorization is terminated  or revoked sooner.       Influenza A by PCR NEGATIVE NEGATIVE Final   Influenza B by  PCR NEGATIVE NEGATIVE Final    Comment: (NOTE) The Xpert Xpress SARS-CoV-2/FLU/RSV plus assay is intended as an aid in the diagnosis of influenza from Nasopharyngeal swab specimens and should not be used as a sole basis for treatment. Nasal washings and aspirates are unacceptable for Xpert Xpress SARS-CoV-2/FLU/RSV testing.  Fact Sheet for Patients: EntrepreneurPulse.com.au  Fact Sheet for Healthcare Providers: IncredibleEmployment.be  This test is not yet approved or cleared by the [place] FDA and has been authorized for detection and/or diagnosis of SARS-CoV-2 by FDA under an Emergency Use Authorization (EUA). This EUA will remain in effect (meaning this test can be used) for the duration of the COVID-19 declaration under Section 564(b)(1) of the Act, 21 U.S.C. section 360bbb-3(b)(1), unless the authorization is terminated or revoked.  Performed at [facility], [place] [address], [place], [place] [address]      Labs: Basic Metabolic Panel: Recent Labs  Lab 10/11/20 1425 10/11/20 2347 10/14/20 0409 10/14/20 0940  NA 139 139 142 137  K 3.5 3.6 5.2* 3.5  CL 110 109 108 109  CO2 20* 20* 26 18*  GLUCOSE 92 103* 96 140*  BUN 22 24* 17 17  CREATININE 0.91 0.91 0.92 0.91  CALCIUM 8.7* 8.4* 9.1 8.9   Liver Function Tests: Recent Labs  Lab 10/11/20 1425  AST 23  ALT 28  ALKPHOS 46  BILITOT 0.7  PROT 6.5  ALBUMIN 3.8   No results for input(s): LIPASE, AMYLASE in the last 168 hours. No results for input(s): AMMONIA in the last 168 hours. CBC: Recent Labs  Lab 10/11/20 1425 10/11/20 2347 10/13/20 0429 10/14/20 0409  WBC 10.0 9.7 7.6 8.0  NEUTROABS 7.2  --   --   --   HGB 14.4 13.5 12.7 13.3  HCT 44.6 39.1 36.8 41.3  MCV 99.1 95.4 96.8 99.0  PLT 136* 132* 146* 167   Cardiac Enzymes: No results for input(s): CKTOTAL, CKMB, CKMBINDEX, TROPONINI in the last 168 hours. BNP: BNP (last 3 results) Recent Labs     10/11/20 1425  BNP 76.5    ProBNP (last 3 results) No results for input(s): PROBNP in the last 8760 hours.  CBG: No results for input(s): GLUCAP in the last 168 hours.   Signed:  Domenic Polite MD.  [facility] 10/14/2020, 1:19 PM

## 2020-10-16 DIAGNOSIS — I6990 Unspecified sequelae of unspecified cerebrovascular disease: Secondary | ICD-10-CM | POA: Diagnosis not present

## 2020-10-16 DIAGNOSIS — R030 Elevated blood-pressure reading, without diagnosis of hypertension: Secondary | ICD-10-CM | POA: Diagnosis not present

## 2020-10-16 DIAGNOSIS — I8290 Acute embolism and thrombosis of unspecified vein: Secondary | ICD-10-CM | POA: Diagnosis not present

## 2020-10-16 DIAGNOSIS — R002 Palpitations: Secondary | ICD-10-CM | POA: Diagnosis not present

## 2020-10-16 DIAGNOSIS — I2699 Other pulmonary embolism without acute cor pulmonale: Secondary | ICD-10-CM | POA: Diagnosis not present

## 2020-10-16 DIAGNOSIS — I513 Intracardiac thrombosis, not elsewhere classified: Secondary | ICD-10-CM | POA: Diagnosis not present

## 2020-10-16 DIAGNOSIS — Z95818 Presence of other cardiac implants and grafts: Secondary | ICD-10-CM | POA: Diagnosis not present

## 2020-10-16 DIAGNOSIS — I878 Other specified disorders of veins: Secondary | ICD-10-CM | POA: Diagnosis not present

## 2020-10-16 DIAGNOSIS — Z7901 Long term (current) use of anticoagulants: Secondary | ICD-10-CM | POA: Diagnosis not present

## 2020-10-16 DIAGNOSIS — I8390 Asymptomatic varicose veins of unspecified lower extremity: Secondary | ICD-10-CM | POA: Diagnosis not present

## 2020-10-16 DIAGNOSIS — K760 Fatty (change of) liver, not elsewhere classified: Secondary | ICD-10-CM | POA: Diagnosis not present

## 2020-10-16 DIAGNOSIS — D6859 Other primary thrombophilia: Secondary | ICD-10-CM | POA: Diagnosis not present

## 2020-10-18 LAB — CUP PACEART REMOTE DEVICE CHECK
Date Time Interrogation Session: 20220223021102
Implantable Pulse Generator Implant Date: 20180927

## 2020-10-22 ENCOUNTER — Ambulatory Visit (INDEPENDENT_AMBULATORY_CARE_PROVIDER_SITE_OTHER): Payer: Medicare Other

## 2020-10-22 ENCOUNTER — Telehealth: Payer: Self-pay | Admitting: Hematology

## 2020-10-22 DIAGNOSIS — I639 Cerebral infarction, unspecified: Secondary | ICD-10-CM | POA: Diagnosis not present

## 2020-10-22 NOTE — Telephone Encounter (Signed)
Received a new hem referral from Dr. Virgina Jock for PE/DVT. Sarah Valdez has been cld and scheduled to see Sarah Valdez on 3/2 at 11am. Pt aware to arrive 20 minuets early.

## 2020-10-23 NOTE — Progress Notes (Signed)
HEMATOLOGY/ONCOLOGY CONSULTATION NOTE  Date of Service: 10/24/2020  Patient Care Team: Shon Baton, MD as PCP - General (Internal Medicine) Thompson Grayer, MD as PCP - Electrophysiology (Cardiology)  CHIEF COMPLAINTS/PURPOSE OF CONSULTATION:  DVT  HISTORY OF PRESENTING ILLNESS:    Sarah Valdez is a wonderful 75 y.o. female who has been referred to [place] by Dr. Shon Baton, MD for evaluation and management of the source and cause of recent DVT and PE.The pt reports that she is doing well overall.  The pt has a history of cryptogenic stroke and dyslipidemia. The pt was in the hospital for an extensive bilateral sub-massive PE and extensive right low extremity DVT. The pt is currently on Eliquis after 48 hours of IV heparin. Her blood counts were normal when she wad admitted. She had significant blood clots in the heart and right leg.   The pt reports that she had a stroke in 2018 that was not very significant. The pt was unaware she had this, but experienced vertigo. She originally visited her PCP who sent her to the ENT.  She had an MRI brain that showed this was subcortical and white matter. The pt notes her dizziness has since resolved and went away. They are currently just monitoring things at this time. The pt was on ASA prior to recent clot experience.  The pt notes that she has been pregnant twice with no clotting problems and no miscarriages. She notes no familial history of clotting or blood disorders. The pt notes that years and years ago she was on birth control for three years. The pt notes  Her mother died suddenly of a severe heart attack. They did not do an autopsy or anything as such.  In the last six months, the pt was asymptomatic and did not feel any differences except feeling lethargic. She attributes this to the pandemic effects on lack of workout and less physical activity. The pt had a radiofrequency ablation in her spine in mid-January for her degenerative disks and  arthritis to help alleviate some pain. She notes this was outpatient procedures and has been improving since. The pt denies being completely immobile. The pt notes no other surgeries, steroids, hormonal medications, or new medication changes. The pt is up to date with all appropriate age related screenings with no concerns related on those.  The pt notes that she was experiencing SOB and chest pain that led to her CT getting an MRI that observed the PE. She got a D-Dimer done as well. The pt denies any leg swelling. This was one week prior to the PE/DVT. She notes a major change in over a week, with going upstairs even more difficult. The pt called the spine doctor for this and they explained this could not have been a side effect thus she called her PCP. She notes her blood tests were okay prior to the D-Dimer. They re-did the D-Dimer the next day due to issues with [facility]. During this time, the pt denies leg cramping or leg swelling. She notes some issues minor, intermittently in her right calf that was similar to mild cramping.   The pt denies any history of smoking. The pt notes she does have varicose veins in her right leg. The pt is currently at 30-40% her baseline activity. The pt has received her [place] and booster, but a year ago and not recent.The pt notes her sister has breast cancer.  On review of systems, pt reports SOB and denies  unexpected weight loss, obvious new symptoms, leg swelling, leg cramping, chest pain, and any other symptoms.  MEDICAL HISTORY:  Past Medical History:  Diagnosis Date  . Allergy   . Bilateral sensorineural hearing loss 11/06/2016  . Cancer ([place])    skin cancer - on back, forehead  . Chronic congestive heart failure ([place]) 02/22/2018  . Cryptogenic stroke [facility])    followed by Dr Jaynee Eagles  . Diverticulosis of colon 2007   Dr Olevia Perches  . Gallstones 01/20/2014   12/2013 multiple gallstones, largest 1.7 cm in diameter; asymptomatic   . History of basal  cell carcinoma excision    X3  . History of colon polyps    NON-CANCEROUS  . History of kidney stones   . Hyperlipidemia   . OA (osteoarthritis of spine)   . Palpitations 11/24/2007   Qualifier: Diagnosis of  By: Linna Darner MD, William    . Pancreatic cyst 01/20/2014   01/03/14 8 x 11 mm nonaggressive cystic lesion the pancreas. Followup recommended in one year.   Marland Kitchen PONV (postoperative nausea and vomiting)   . PSVT (paroxysmal supraventricular tachycardia) (H[facility])    adenosine sensitive SVT previously documented  . Right ureteral stone   . Urgency of urination   . Wears glasses     SURGICAL HISTORY: Past Surgical History:  Procedure Laterality Date  . APPENDECTOMY  1950  . 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 CLOSED MANIPULATION POST TOTAL  KNEE ARTHROPLASTY  07-13-2006  . COLONOSCOPY W/ POLYPECTOMY  2002     adenomatous; Dr Olevia Perches  . epidural steroid injections     two rounds of injections; lumbar spine   . EXTRACORPOREAL SHOCK WAVE LITHOTRIPSY  X3  . FACELIFT W/BLEPHAROPLASTY  2005  . HYSTEROSCOPY WITH D & C  12-22-2000  . LOOP RECORDER INSERTION N/A 05/21/2017   Procedure: LOOP RECORDER INSERTION;  Surgeon: Thompson Grayer, MD;  Location: [place] [facility];  Service: Cardiovascular;  Laterality: N/A;  . OPEN KNEE MENISECTOMY  1964  . RIGHT URETEROSCOPIC STONE EXTRACTION / STENT PLACEMENT  08-21-2008  . rotator cuff on right shoulder    . TONSILLECTOMY  1950  . TOTAL KNEE ARTHROPLASTY Right 05-30-2006  . TUBAL LIGATION    . WISDOM TOOTH EXTRACTION      SOCIAL HISTORY: Social History   Socioeconomic History  . Marital status: Married    Spouse name: Not on file  . Number of children: 2  . Years of education: Not on file  . Highest education level: Bachelor's degree (e.g., BA, AB, BS)  Occupational History  . Not on file  Tobacco Use  . Smoking status: Never Smoker  . Smokeless tobacco: Never Used   Vaping Use  . Vaping Use: Never used  Substance and Sexual Activity  . Alcohol use: Yes    Alcohol/week: 5.0 standard drinks    Types: 5 Glasses of wine per week  . Drug use: No  . Sexual activity: Not on file  Other Topics Concern  . Not on file  Social History Narrative   Lives at home in [place] with spouse.    Retired Careers information officer.   Active in the community.   Right handed   Drinks 1 cup of caffeine daily   Social Determinants of Health   Financial Resource Strain: Not on file  Food Insecurity: Not on file  Transportation Needs: Not on file  Physical Activity: Not on file  Stress: Not  on file  Social Connections: Not on file  Intimate Partner Violence: Not on file    FAMILY HISTORY: Family History  Problem Relation Age of Onset  . Heart attack Mother 49  . Emphysema Father        smoker  . Colon polyps Father   . Breast cancer Sister        late 27s  . Cancer Paternal Grandfather        throat cancer  . Esophageal cancer Paternal Grandfather   . Heart attack Maternal Grandfather 76  . Diabetes Neg Hx   . Stroke Neg Hx   . Colon cancer Neg Hx   . Rectal cancer Neg Hx   . Stomach cancer Neg Hx     ALLERGIES:  is allergic to morphine, penicillins, and levofloxacin.  MEDICATIONS:  Current Outpatient Medications  Medication Sig Dispense Refill  . alendronate (FOSAMAX) 70 MG tablet Take 70 mg by mouth once a week. Thursdays    . APIXABAN (ELIQUIS) VTE STARTER PACK (10MG  AND 5MG ) Take as directed on package: start with two-5mg  tablets twice daily for 7 days. On day 8, switch to one-5mg  tablet twice daily. 1 each 0  . cetirizine (ZYRTEC) 10 MG tablet Take 10 mg by mouth daily.    . Cholecalciferol (VITAMIN D) 2000 units CAPS Take 1 capsule at bedtime by mouth.     . diltiazem (CARDIZEM) 120 MG tablet Take 1 tablet (120 mg total) by mouth daily. Please keep upcoming appt in January 2022 with Dr. Rayann Heman before anymore refills. Thank you 90 tablet 0  .  pravastatin (PRAVACHOL) 40 MG tablet Take 40 mg by mouth daily.     No current facility-administered medications for this visit.    REVIEW OF SYSTEMS:   10 Point review of Systems was done is negative except as noted above.  PHYSICAL EXAMINATION: ECOG PERFORMANCE STATUS: 1 - Symptomatic but completely ambulatory  . Vitals:   10/24/20 1115  BP: 103/63  Pulse: 82  Resp: 14  Temp: 97.6 F (36.4 C)  SpO2: 98%   Filed Weights   10/24/20 1115  Weight: 171 lb 12.8 oz (77.9 kg)   .Body mass index is 26.91 kg/m.  GENERAL:alert, in no acute distress and comfortable SKIN: no acute rashes, no significant lesions EYES: conjunctiva are pink and non-injected, sclera anicteric OROPHARYNX: MMM, no exudates, no oropharyngeal erythema or ulceration NECK: supple, no JVD LYMPH:  no palpable lymphadenopathy in the cervical, axillary or inguinal regions LUNGS: clear to auscultation b/l with normal respiratory effort HEART: regular rate & rhythm ABDOMEN:  normoactive bowel sounds , non tender, not distended. Extremity: no pedal edema PSYCH: alert & oriented x 3 with fluent speech NEURO: no focal motor/sensory deficits  LABORATORY DATA:  I have reviewed the data as listed  . CBC Latest Ref Rng & Units 10/14/2020 10/13/2020 10/11/2020  WBC 4.0 - 10.5 K/uL 8.0 7.6 9.7  Hemoglobin 12.0 - 15.0 g/dL 13.3 12.7 13.5  Hematocrit 36.0 - 46.0 % 41.3 36.8 39.1  Platelets 150 - 400 K/uL 167 146(L) 132(L)    . CMP Latest Ref Rng & Units 10/14/2020 10/14/2020 10/11/2020  Glucose 70 - 99 mg/dL 140(H) 96 103(H)  BUN 8 - 23 mg/dL 17 17 24(H)  Creatinine 0.44 - 1.00 mg/dL 0.91 0.92 0.91  Sodium 135 - 145 mmol/L 137 142 139  Potassium 3.5 - 5.1 mmol/L 3.5 5.2(H) 3.6  Chloride 98 - 111 mmol/L 109 108 109  CO2 22 - 32 mmol/L 18(L)  26 20(L)  Calcium 8.9 - 10.3 mg/dL 8.9 9.1 8.4(L)  Total Protein 6.5 - 8.1 g/dL - - -  Total Bilirubin 0.3 - 1.2 mg/dL - - -  Alkaline Phos 38 - 126 U/L - - -  AST 15 - 41 U/L  - - -  ALT 0 - 44 U/L - - -     RADIOGRAPHIC STUDIES: I have personally reviewed the radiological images as listed and agreed with the findings in the report. CT ANGIO CHEST PE W OR WO CONTRAST  Result Date: 10/11/2020 CLINICAL DATA:  Shortness of breath, elevated D-dimer EXAM: CT ANGIOGRAPHY CHEST WITH CONTRAST TECHNIQUE: Multidetector CT imaging of the chest was performed using the standard protocol during bolus administration of intravenous contrast. Multiplanar CT image reconstructions and MIPs were obtained to evaluate the vascular anatomy. CONTRAST:  49mL ISOVUE-370 IOPAMIDOL (ISOVUE-370) INJECTION 76% COMPARISON:  Chest x-ray 11/12/2009 FINDINGS: Cardiovascular: Satisfactory opacification of the pulmonary arteries. Extensive bilateral pulmonary emboli with thrombus present within both the right and left main pulmonary artery with extension into the lobar branches and multiple segmental branches. No saddle embolism. RV to LV ratio of 1.4 indicative of right heart strain. Heart size is normal. No pericardial effusion. Thoracic aorta is nonaneurysmal. Three vessel arch. Mediastinum/Nodes: No enlarged mediastinal, hilar, or axillary lymph nodes. Thyroid gland, trachea, and esophagus demonstrate no significant findings. Lungs/Pleura: Small wedge-shaped opacity in the periphery of the left upper lobe anteriorly (series 12, image 92) which may reflect an area of pulmonary infarction. Elsewhere, lungs are clear. No pleural effusion or pneumothorax. Upper Abdomen: Suspect cholelithiasis within the partially imaged gallbladder. Musculoskeletal: Lower left chest wall implanted loop recorder. Left worse than right glenohumeral joint osteoarthritis. Advanced degenerative disc disease of T12-L1. No acute osseous findings. Review of the MIP images confirms the above findings. IMPRESSION: 1. Extensive bilateral pulmonary emboli with evidence of right heart strain (RV/LV Ratio 1.4) consistent with at least submassive  (intermediate risk) PE. The presence of right heart strain has been associated with an increased risk of morbidity and mortality. 2. Small wedge-shaped opacity in the periphery of the left upper lobe anteriorly may reflect an area of pulmonary infarction. 3. Suspect cholelithiasis. Critical Value/emergent results were called by telephone at the time of interpretation on 10/11/2020 at 12:31 pm to provider Dr Shon Baton, who verbally acknowledged these results. Electronically Signed   By: Davina Poke D.O.   On: 10/11/2020 12:32   MM 3D SCREEN BREAST BILATERAL  Result Date: 10/08/2020 CLINICAL DATA:  Screening. EXAM: DIGITAL SCREENING BILATERAL MAMMOGRAM WITH TOMOSYNTHESIS AND CAD TECHNIQUE: Bilateral screening digital craniocaudal and mediolateral oblique mammograms were obtained. Bilateral screening digital breast tomosynthesis was performed. The images were evaluated with computer-aided detection. COMPARISON:  Previous exam(s). ACR Breast Density Category b: There are scattered areas of fibroglandular density. FINDINGS: There are no findings suspicious for malignancy. IMPRESSION: No mammographic evidence of malignancy. A result letter of this screening mammogram will be mailed directly to the patient. RECOMMENDATION: Screening mammogram in one year. (Code:SM-B-01Y) BI-RADS CATEGORY  1: Negative. Electronically Signed   By: Nolon Nations M.D.   On: 10/08/2020 10:23   CUP PACEART REMOTE DEVICE CHECK  Result Date: 10/18/2020 ILR summary report received. Battery status OK. Normal device function. No new symptom, tachy, brady, or pause episodes. No new AF episodes. Monthly summary reports and ROV/PRN Kathy Breach, RN, CCDS, [employer]  VAS [place] LOWER EXTREMITY VENOUS (DVT) (ONLY [facility] & [facility] 7a-7p)  Result Date: 10/11/2020  Lower Venous  DVT Study Indications: Pain, and pulmonary embolism.  Comparison Study: No prior study on file Performing Technologist: Sharion Dove RVS  Examination Guidelines: A  complete evaluation includes B-mode imaging, spectral Doppler, color Doppler, and power Doppler as needed of all accessible portions of each vessel. Bilateral testing is considered an integral part of a complete examination. Limited examinations for reoccurring indications may be performed as noted. The reflux portion of the exam is performed with the patient in reverse Trendelenburg.  +---------+---------------+---------+-----------+----------+--------------+ RIGHT    CompressibilityPhasicitySpontaneityPropertiesThrombus Aging +---------+---------------+---------+-----------+----------+--------------+ CFV      Partial                                      Acute          +---------+---------------+---------+-----------+----------+--------------+ SFJ      Full                                                        +---------+---------------+---------+-----------+----------+--------------+ FV Prox  None                                         Acute          +---------+---------------+---------+-----------+----------+--------------+ FV Mid   None                                         Acute          +---------+---------------+---------+-----------+----------+--------------+ FV DistalNone                                         Acute          +---------+---------------+---------+-----------+----------+--------------+ PFV      Full                                                        +---------+---------------+---------+-----------+----------+--------------+ POP      None           No       No                   Acute          +---------+---------------+---------+-----------+----------+--------------+ PTV      Full                                                        +---------+---------------+---------+-----------+----------+--------------+ PERO     Full                                                         +---------+---------------+---------+-----------+----------+--------------+  Gastroc  None                                         Acute          +---------+---------------+---------+-----------+----------+--------------+   +----+---------------+---------+-----------+----------+--------------+ LEFTCompressibilityPhasicitySpontaneityPropertiesThrombus Aging +----+---------------+---------+-----------+----------+--------------+ CFV Full           Yes      Yes                                 +----+---------------+---------+-----------+----------+--------------+     Summary: RIGHT: - Findings consistent with acute deep vein thrombosis involving the right common femoral vein, right femoral vein, right popliteal vein, and right gastrocnemius veins.   *See table(s) above for measurements and observations. Electronically signed by Monica Martinez MD on 10/11/2020 at 4:35:17 PM.    Final    ECHOCARDIOGRAM LIMITED  Result Date: 10/11/2020    ECHOCARDIOGRAM LIMITED REPORT   Patient Name:   JAVIER MAMONE Date of Exam: 10/11/2020 Medical Rec #:  [id number]      Height:       67.0 in Accession #:    [id number]     Weight:       168.0 lb Date of Birth:  02-05-1946     BSA:          1.878 m Patient Age:    80 years       BP:           120/104 mmHg Patient Gender: F              HR:           67 bpm. Exam Location:  Inpatient Procedure: Limited Echo, Limited Color Doppler and Cardiac Doppler                        STAT ECHO  Reported to: Dr. Harrell Gave on 10/11/2020 4:52:00 PM Communicated via amion message to Dr. Lorin Mercy at 5:43 PM. Indications:     I26.02 Pulmonary embolus  History:         Patient has prior history of Echocardiogram examinations, most                  recent 01/12/2017. Stroke; Risk Factors:Dyslipidemia. Cancer.  Sonographer:     Jonelle Sidle Dance Referring Phys:  [place] Diagnosing Phys: Buford Dresser MD IMPRESSIONS  1. Left ventricular ejection fraction, by estimation, is  55 to 60%. The left ventricle has normal function. The left ventricle has no regional wall motion abnormalities. Left ventricular diastolic function could not be evaluated.  2. Right ventricular systolic function is normal. The right ventricular size is mildly enlarged. There is normal pulmonary artery systolic pressure.  3. There is an echodensity adjacent to the intra-atrial septum. Cannot determine etiology of mass, but given extensive pulmonary embolism burden, concern would be for clot. Consider reimaging in the future vs additional imaging modality.  4. The mitral valve is grossly normal. Trivial mitral valve regurgitation. No evidence of mitral stenosis. Moderate mitral annular calcification.  5. The aortic valve is tricuspid. Aortic valve regurgitation is not visualized. No aortic stenosis is present.  6. The inferior vena cava is normal in size with greater than 50% respiratory variability, suggesting right atrial pressure of 3 mmHg. Comparison(s): No prior Echocardiogram. Conclusion(s)/Recommendation(s): Normal LV fuction, mildly enlarged  RV size. Echodensity adjacent to intra-atrial septum, see comments. FINDINGS  Left Ventricle: Left ventricular ejection fraction, by estimation, is 55 to 60%. The left ventricle has normal function. The left ventricle has no regional wall motion abnormalities. There is no left ventricular hypertrophy. Left ventricular diastolic function could not be evaluated. Right Ventricle: The right ventricular size is mildly enlarged. Right vetricular wall thickness was not well visualized. Right ventricular systolic function is normal. There is normal pulmonary artery systolic pressure. The tricuspid regurgitant velocity  is 2.62 m/s, and with an assumed right atrial pressure of 3 mmHg, the estimated right ventricular systolic pressure is 54.6 mmHg. Left Atrium: Left atrial size was normal in size. Right Atrium: Right atrial size was normal in size. Pericardium: Trivial pericardial  effusion is present. Presence of pericardial fat pad. Mitral Valve: The mitral valve is grossly normal. There is mild calcification of the mitral valve leaflet(s). Moderate mitral annular calcification. Trivial mitral valve regurgitation. No evidence of mitral valve stenosis. Tricuspid Valve: The tricuspid valve is normal in structure. Tricuspid valve regurgitation is mild . No evidence of tricuspid stenosis. Aortic Valve: The aortic valve is tricuspid. There is mild to moderate aortic valve annular calcification. Aortic valve regurgitation is not visualized. No aortic stenosis is present. Pulmonic Valve: The pulmonic valve was grossly normal. Pulmonic valve regurgitation is mild to moderate. No evidence of pulmonic stenosis. Aorta: The aortic root, ascending aorta and aortic arch are all structurally normal, with no evidence of dilitation or obstruction. Venous: The inferior vena cava is normal in size with greater than 50% respiratory variability, suggesting right atrial pressure of 3 mmHg. IAS/Shunts: The atrial septum is grossly normal. LEFT VENTRICLE PLAX 2D LVIDd:         3.70 cm LVIDs:         2.60 cm LV PW:         0.90 cm LV IVS:        1.00 cm LVOT diam:     1.80 cm LV SV:         47 LV SV Index:   25 LVOT Area:     2.54 cm  RIGHT VENTRICLE             IVC RV Basal diam:  2.40 cm     IVC diam: 1.80 cm RV S prime:     12.70 cm/s TAPSE (M-mode): 1.8 cm LEFT ATRIUM           Index       RIGHT ATRIUM           Index LA diam:      2.90 cm 1.54 cm/m  RA Area:     10.70 cm LA Vol (A2C): 31.9 ml 16.99 ml/m RA Volume:   25.20 ml  13.42 ml/m LA Vol (A4C): 17.7 ml 9.43 ml/m  AORTIC VALVE LVOT Vmax:   88.70 cm/s LVOT Vmean:  60.500 cm/s LVOT VTI:    0.183 m  AORTA Ao Root diam: 2.80 cm Ao Asc diam:  3.30 cm MITRAL VALVE               TRICUSPID VALVE MV Area (PHT): 2.54 cm    TR Peak grad:   27.5 mmHg MV Decel Time: 299 msec    TR Vmax:        262.00 cm/s MV E velocity: 52.40 cm/s MV A velocity: 72.30 cm/s   SHUNTS MV E/A ratio:  0.72        Systemic VTI:  0.18 m                            Systemic Diam: 1.80 cm Buford Dresser MD Electronically signed by Buford Dresser MD Signature Date/Time: 10/11/2020/5:38:30 PM    Final (Updated)     ASSESSMENT & PLAN:    75 yo with   1) Unprovoked Rt LE DVT 2) B/L unprovoked submassive Pulmonary embolism PLAN: -Discussed whether the pt's recent PE/DVT was provoked or unprovoked. Advised pt there is no real provoking nature at this time that is obvious to have warranted the extent of clot the pt had. Procedures she has would not explain extent of clot she had. -Discussed differences in treatment and risk factors for provoked versus unprovoked DVT/PE. -Advised pt that regardless of the reason, if someone has an extensive clot we would recommend long term blood thinners.  -Recommended pt receive anticoagulation panel labwork today. Will observe if acquired risk factors. The pt is agreeable to this.  -Advised pt that majority of clot will dissolve within 6-8 weeks of beginning blood thinners. Breathing should be improved in 2-3 months. -Discussed risk of significant event or decompensation for major clots in the first 30 days.  -Advised pt that newer clots dissolve quicker than older clots. Older clots will be more hardened and calcified. The pt did have acute clots. -Discussed post-Thrombotic Syndrome.  -Advised pt it would not be unreasonable to use compression socks, especially on right leg.  -Recommended pt stay well hydrated and drink 48-64 oz water daily. -Recommended pt stay physically active and walk 20-30 min daily. -Advised pt the chance of failing blood thinners is less than 2% assuming taking regularly. -Advised pt to stay up to date with all age appropriate screenings. -Recommended warm compresses for muscle tightness in neck. -Continue Eliquis BID. Will continue this long-term. -Will see back in 2 weeks via phone.   FOLLOW  UP: Labs today Phone visit with Dr Irene Limbo in 2 weeks  . Orders Placed This Encounter  Procedures  . CBC with Differential/Platelet    Standing Status:   Future    Number of Occurrences:   1    Standing Expiration Date:   10/24/2021  . CMP ([facility] only)    Standing Status:   Future    Number of Occurrences:   1    Standing Expiration Date:   10/24/2021  . Antithrombin III    Standing Status:   Future    Number of Occurrences:   1    Standing Expiration Date:   10/24/2021  . Protein C activity    Standing Status:   Future    Number of Occurrences:   1    Standing Expiration Date:   10/24/2021  . Protein C, total    Standing Status:   Future    Number of Occurrences:   1    Standing Expiration Date:   10/24/2021  . Protein S activity    Standing Status:   Future    Number of Occurrences:   1    Standing Expiration Date:   10/24/2021  . Protein S, total    Standing Status:   Future    Number of Occurrences:   1    Standing Expiration Date:   10/24/2021  . Lupus anticoagulant panel    Standing Status:   Future    Number of Occurrences:   1    Standing Expiration Date:   10/24/2021  .  Beta-2-glycoprotein i abs, IgG/M/A    Standing Status:   Future    Number of Occurrences:   1    Standing Expiration Date:   10/24/2021  . Factor 5 leiden    Standing Status:   Future    Number of Occurrences:   1    Standing Expiration Date:   10/24/2021  . Prothrombin gene mutation    Standing Status:   Future    Number of Occurrences:   1    Standing Expiration Date:   10/24/2021  . Cardiolipin antibodies, IgG, IgM, IgA    Standing Status:   Future    Number of Occurrences:   1    Standing Expiration Date:   10/24/2021  . JAK2 (including V617F and Exon 12), MPL, and CALR-Next Generation Sequencing    Standing Status:   Future    Number of Occurrences:   1    Standing Expiration Date:   10/24/2021    All of the patients questions were answered with apparent satisfaction. The patient knows to call the  clinic with any problems, questions or concerns.  I spent 50 minutes counseling the patient face to face. The total time spent in the appointment was 60 minutes and more than 50% was on counseling and direct patient cares.    Sullivan Lone MD [place] AAHIVMS [facility] Hematology/Oncology Physician [facility]  (Office):       [phone] (Work cell):  [phone] (Fax):           [phone]  10/24/2020 12:11 PM  I, Reinaldo Raddle, am acting as scribe for Dr. Sullivan Lone, MD.   .I have reviewed the above documentation for accuracy and completeness, and I agree with the above. Brunetta Genera MD

## 2020-10-24 ENCOUNTER — Inpatient Hospital Stay: Payer: Medicare Other | Attending: Hematology | Admitting: Hematology

## 2020-10-24 ENCOUNTER — Inpatient Hospital Stay: Payer: Medicare Other

## 2020-10-24 ENCOUNTER — Other Ambulatory Visit: Payer: Self-pay

## 2020-10-24 VITALS — BP 103/63 | HR 82 | Temp 97.6°F | Resp 14 | Ht 67.0 in | Wt 171.8 lb

## 2020-10-24 DIAGNOSIS — I2699 Other pulmonary embolism without acute cor pulmonale: Secondary | ICD-10-CM

## 2020-10-24 DIAGNOSIS — Z7901 Long term (current) use of anticoagulants: Secondary | ICD-10-CM | POA: Diagnosis not present

## 2020-10-24 DIAGNOSIS — Z8673 Personal history of transient ischemic attack (TIA), and cerebral infarction without residual deficits: Secondary | ICD-10-CM | POA: Diagnosis not present

## 2020-10-24 DIAGNOSIS — Z79899 Other long term (current) drug therapy: Secondary | ICD-10-CM | POA: Insufficient documentation

## 2020-10-24 DIAGNOSIS — D6859 Other primary thrombophilia: Secondary | ICD-10-CM

## 2020-10-24 DIAGNOSIS — I82411 Acute embolism and thrombosis of right femoral vein: Secondary | ICD-10-CM

## 2020-10-24 LAB — CBC WITH DIFFERENTIAL/PLATELET
Abs Immature Granulocytes: 0.07 10*3/uL (ref 0.00–0.07)
Basophils Absolute: 0.1 10*3/uL (ref 0.0–0.1)
Basophils Relative: 1 %
Eosinophils Absolute: 0.3 10*3/uL (ref 0.0–0.5)
Eosinophils Relative: 4 %
HCT: 41.2 % (ref 36.0–46.0)
Hemoglobin: 13.8 g/dL (ref 12.0–15.0)
Immature Granulocytes: 1 %
Lymphocytes Relative: 23 %
Lymphs Abs: 1.9 10*3/uL (ref 0.7–4.0)
MCH: 32.5 pg (ref 26.0–34.0)
MCHC: 33.5 g/dL (ref 30.0–36.0)
MCV: 96.9 fL (ref 80.0–100.0)
Monocytes Absolute: 0.8 10*3/uL (ref 0.1–1.0)
Monocytes Relative: 10 %
Neutro Abs: 5 10*3/uL (ref 1.7–7.7)
Neutrophils Relative %: 61 %
Platelets: 208 10*3/uL (ref 150–400)
RBC: 4.25 MIL/uL (ref 3.87–5.11)
RDW: 13.8 % (ref 11.5–15.5)
WBC: 8.1 10*3/uL (ref 4.0–10.5)
nRBC: 0 % (ref 0.0–0.2)

## 2020-10-24 LAB — CMP (CANCER CENTER ONLY)
ALT: 29 U/L (ref 0–44)
AST: 19 U/L (ref 15–41)
Albumin: 3.8 g/dL (ref 3.5–5.0)
Alkaline Phosphatase: 56 U/L (ref 38–126)
Anion gap: 10 (ref 5–15)
BUN: 22 mg/dL (ref 8–23)
CO2: 22 mmol/L (ref 22–32)
Calcium: 9.2 mg/dL (ref 8.9–10.3)
Chloride: 109 mmol/L (ref 98–111)
Creatinine: 0.94 mg/dL (ref 0.44–1.00)
GFR, Estimated: >60 mL/min (ref >60)
Glucose, Bld: 98 mg/dL (ref 70–99)
Potassium: 3.5 mmol/L (ref 3.5–5.1)
Sodium: 141 mmol/L (ref 135–145)
Total Bilirubin: 0.4 mg/dL (ref 0.3–1.2)
Total Protein: 7 g/dL (ref 6.5–8.1)

## 2020-10-24 LAB — ANTITHROMBIN III: AntiThromb III Func: 112 % (ref 75–120)

## 2020-10-24 NOTE — Patient Instructions (Signed)
Thank you for choosing [facility] to provide your oncology and hematology care.   Should you have questions after your visit to the [facility] ([facility]), please contact this office at [phone] between 8:30 AM and 4:30 PM.  Voice mails left after 4:00 PM may not be returned until the following business day.  Calls received after 4:30 PM will be answered by an off-site Nurse Triage Line.    Prescription Refills:  Please have your pharmacy contact us directly for most prescription requests.  Contact the office directly for refills of narcotics (pain medications). Allow 48-72 hours for refills.  Appointments: Please contact the [facility] scheduling department [phone] for questions regarding [facility] appointment scheduling.  Contact the schedulers with any scheduling changes so that your appointment can be rescheduled in a timely manner.   Central Scheduling for [place] [phone] - Call to schedule procedures such as PET scans, CT scans, MRI, Ultrasound, etc.  To afford each patient quality time with our providers, please arrive 30 minutes before your scheduled appointment time.  If you arrive late for your appointment, you may be asked to reschedule.  We strive to give you quality time with our providers, and arriving late affects you and other patients whose appointments are after yours. If you are a no show for multiple scheduled visits, you may be dismissed from the clinic at the providers discretion.     Resources: [facility] Social Workers [phone] for additional information on assistance programs or assistance connecting with community support programs   [place] DSS  336-641-3447: Information regarding food stamps, Medicaid, and utility assistance [place] [employer] [place] 336-333-6589   [place] [employer]'s shared-ride transportation service for eligible riders who have a disability that prevents them from riding the fixed route bus.   Medicare  Rights Center 800-333-4114 Helps people with Medicare understand their rights and benefits, navigate the Medicare system, and secure the quality healthcare they deserve [employer] 800-227-2345 Assists patients locate various types of support and financial assistance Cancer Care: 1-800-813-HOPE (4673) Provides financial assistance, online support groups, medication/co-pay assistance.   Transportation Assistance for appointments at [facility]: Transportation Coordinator [phone]  Again, thank you for choosing [facility] for your care.       

## 2020-10-25 ENCOUNTER — Telehealth: Payer: Self-pay

## 2020-10-25 LAB — PROTEIN C ACTIVITY: Protein C Activity: 171 % (ref 73–180)

## 2020-10-25 LAB — CARDIOLIPIN ANTIBODIES, IGG, IGM, IGA
Anticardiolipin IgA: <9 APL U/mL (ref 0–11)
Anticardiolipin IgG: <9 GPL U/mL (ref 0–14)
Anticardiolipin IgM: <9 MPL U/mL (ref 0–12)

## 2020-10-25 LAB — PROTEIN S ACTIVITY: Protein S Activity: 91 % (ref 63–140)

## 2020-10-25 LAB — PROTEIN S, TOTAL: Protein S Ag, Total: 130 % (ref 60–150)

## 2020-10-25 NOTE — Telephone Encounter (Signed)
ILR has reached RRT 10/24/20. Patient reports she would like to have it removed. Has OV with Dr. Rayann Heman next week. Advised I will sent that to scheduling to see if they can add it on or if she needs another apt.  Advised to call with further questions or concerns.

## 2020-10-26 LAB — LUPUS ANTICOAGULANT PANEL
DRVVT: 63.3 s — ABNORMAL HIGH (ref 0.0–47.0)
PTT Lupus Anticoagulant: 29.6 s (ref 0.0–51.9)

## 2020-10-26 LAB — BETA-2-GLYCOPROTEIN I ABS, IGG/M/A
Beta-2 Glyco I IgG: <9 GPI IgG units (ref 0–20)
Beta-2-Glycoprotein I IgA: <9 GPI IgA units (ref 0–25)
Beta-2-Glycoprotein I IgM: <9 GPI IgM units (ref 0–32)

## 2020-10-26 LAB — DRVVT MIX: dRVVT Mix: 53.2 s — ABNORMAL HIGH (ref 0.0–40.4)

## 2020-10-26 LAB — DRVVT CONFIRM: dRVVT Confirm: 1 ratio (ref 0.8–1.2)

## 2020-10-26 LAB — PROTEIN C, TOTAL: Protein C, Total: 142 % (ref 60–150)

## 2020-10-29 ENCOUNTER — Ambulatory Visit (INDEPENDENT_AMBULATORY_CARE_PROVIDER_SITE_OTHER): Payer: Medicare Other | Admitting: Internal Medicine

## 2020-10-29 ENCOUNTER — Other Ambulatory Visit: Payer: Self-pay

## 2020-10-29 ENCOUNTER — Encounter: Payer: Self-pay | Admitting: Internal Medicine

## 2020-10-29 VITALS — BP 122/78 | HR 90 | Ht 67.0 in | Wt 170.8 lb

## 2020-10-29 DIAGNOSIS — I2699 Other pulmonary embolism without acute cor pulmonale: Secondary | ICD-10-CM

## 2020-10-29 DIAGNOSIS — I639 Cerebral infarction, unspecified: Secondary | ICD-10-CM | POA: Diagnosis not present

## 2020-10-29 DIAGNOSIS — I471 Supraventricular tachycardia: Secondary | ICD-10-CM

## 2020-10-29 HISTORY — PX: OTHER SURGICAL HISTORY: SHX169

## 2020-10-29 LAB — FACTOR 5 LEIDEN

## 2020-10-29 LAB — PROTHROMBIN GENE MUTATION

## 2020-10-29 NOTE — Progress Notes (Signed)
Carelink Summary Report / Loop Recorder 

## 2020-10-29 NOTE — Progress Notes (Signed)
PCP: Shon Baton, MD   Primary EP: Dr Rayann Heman  Sarah Valdez is a 75 y.o. female who presents today for routine electrophysiology followup.  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  She has been started on [facility] therapy.  Today, she denies symptoms of palpitations, chest pain, shortness of breath,  lower extremity edema, dizziness, presyncope, or syncope.  The patient is otherwise without complaint today.   Past Medical History:  Diagnosis Date  . Allergy   . Bilateral sensorineural hearing loss 11/06/2016  . Cancer ([place])    skin cancer - on back, forehead  . Chronic congestive heart failure ([place]) 02/22/2018  . Cryptogenic stroke [facility])    followed by Dr Jaynee Eagles  . Diverticulosis of colon 2007   Dr Olevia Perches  . Gallstones 01/20/2014   12/2013 multiple gallstones, largest 1.7 cm in diameter; asymptomatic   . History of basal cell carcinoma excision    X3  . History of colon polyps    NON-CANCEROUS  . History of kidney stones   . Hyperlipidemia   . OA (osteoarthritis of spine)   . Palpitations 11/24/2007   Qualifier: Diagnosis of  By: Linna Darner MD, [place]    . Pancreatic cyst 01/20/2014   01/03/14 8 x 11 mm nonaggressive cystic lesion the pancreas. Followup recommended in one year.   Marland Kitchen PONV (postoperative nausea and vomiting)   . PSVT (paroxysmal supraventricular tachycardia) ([facility])    adenosine sensitive SVT previously documented  . Right ureteral stone   . Urgency of urination   . Wears glasses    Past Surgical History:  Procedure Laterality Date  . APPENDECTOMY  1950  . 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 CLOSED MANIPULATION POST TOTAL  KNEE ARTHROPLASTY  07-13-2006  . COLONOSCOPY W/ POLYPECTOMY  2002     adenomatous; Dr Olevia Perches  . epidural steroid injections     two rounds of injections; lumbar spine   . EXTRACORPOREAL SHOCK WAVE LITHOTRIPSY  X3  . FACELIFT  W/BLEPHAROPLASTY  2005  . HYSTEROSCOPY WITH D & C  12-22-2000  . LOOP RECORDER INSERTION N/A 05/21/2017   Procedure: LOOP RECORDER INSERTION;  Surgeon: Thompson Grayer, MD;  Location: [place] [facility];  Service: Cardiovascular;  Laterality: N/A;  . OPEN KNEE MENISECTOMY  1964  . RIGHT URETEROSCOPIC STONE EXTRACTION / STENT PLACEMENT  08-21-2008  . rotator cuff on right shoulder    . TONSILLECTOMY  1950  . TOTAL KNEE ARTHROPLASTY Right 05-30-2006  . TUBAL LIGATION    . WISDOM TOOTH EXTRACTION      ROS- all systems are reviewed and negatives except as per HPI above  Current Outpatient Medications  Medication Sig Dispense Refill  . alendronate (FOSAMAX) 70 MG tablet Take 70 mg by mouth once a week. Thursdays    . apixaban (ELIQUIS) 5 MG TABS tablet Take 1 tablet by mouth in the morning and at bedtime.    . cetirizine (ZYRTEC) 10 MG tablet Take 10 mg by mouth daily.    . Cholecalciferol (VITAMIN D) 2000 units CAPS Take 1 capsule at bedtime by mouth.     . diltiazem (CARDIZEM) 120 MG tablet Take 1 tablet (120 mg total) by mouth daily. Please keep upcoming appt in January 2022 with Dr. Rayann Heman before anymore refills. Thank you 90 tablet 0  . pravastatin (PRAVACHOL) 40 MG tablet Take 40 mg by mouth  daily.     No current facility-administered medications for this visit.    Physical Exam: Vitals:   10/29/20 1530  BP: 122/78  Pulse: 90  SpO2: 93%  Weight: 170 lb 12.8 oz (77.5 kg)  Height: 5\' 7"  (1.702 m)    GEN- The patient is well appearing, alert and oriented x 3 today.   Head- normocephalic, atraumatic Eyes-  Sclera clear, conjunctiva pink Ears- hearing intact Oropharynx- clear Lungs- Clear to ausculation bilaterally, normal work of breathing Heart- Regular rate and rhythm, no murmurs, rubs or gallops, PMI not laterally displaced GI- soft, NT, ND, + BS Extremities- no clubbing, cyanosis, or edema  Wt Readings from Last 3 Encounters:  10/29/20 170 lb 12.8 oz (77.5 kg)  10/24/20  171 lb 12.8 oz (77.9 kg)  10/14/20 167 lb 3.2 oz (75.8 kg)    EKG tracing ordered today is personally reviewed and shows sinus rhythm Echo reviewed  Assessment and Plan:  1. Recent diagnosis of PTE Now on eliquis Follows with PCP Repeat echo in 3 months  2. SVT Well controlled  3. HL Continue statin  4. Prior cryptogenic stroke She will require [place] for PTE going forward Her ILR is at RRT  Risks and benefits of ILR removal were discussed with the patient today. She understands risks include but are not limited to bleeding and infection and wishes to proceed.  Return to see EP PA in a year  Thompson Grayer MD, [facility] 10/29/2020 3:54 PM      PROCEDURES:   1. Implantable loop recorder explantation       DESCRIPTION OF PROCEDURE:  Informed written consent was obtained.  The patient required no sedation for the procedure today.   The patients left chest was therefore prepped and draped in the usual sterile fashion.  The skin overlying the ILR monitor was infiltrated with lidocaine for local analgesia.  A 0.5-cm incision was made over the site.  The previously implanted ILR was exposed and removed using a combination of sharp and blunt dissection.  Steri- Strips and a sterile dressing were then applied. EBL<16ml.  There were no early apparent complications.     CONCLUSIONS:   1. Successful explantation of a [employer] Reveal LINQ implantable loop recorder   2. No early apparent complications.        Thompson Grayer MD, [facility] 10/29/2020 4:34 PM

## 2020-10-29 NOTE — Patient Instructions (Addendum)
Medication Instructions:  Your physician recommends that you continue on your current medications as directed. Please refer to the Current Medication list given to you today.  Labwork: None ordered.  Testing/Procedures: Your physician has requested that you have an echocardiogram. Echocardiography is a painless test that uses sound waves to create images of your heart. It provides your doctor with information about the size and shape of your heart and how well your heart's chambers and valves are working. This procedure takes approximately one hour. There are no restrictions for this procedure.  Please schedule for ECHO in 3 months (June 2022)  Follow-Up:  Your physician wants you to follow-up in: one year with Tommye Standard PA.  You will receive a reminder letter in the mail two months in advance. If you don't receive a letter, please call our office to schedule the follow-up appointment.    Implantable Loop Recorder Removal, Care After This sheet gives you information about how to care for yourself after your procedure. Your health care provider may also give you more specific instructions. If you have problems or questions, contact your health care provider. What can I expect after the procedure? After the procedure, it is common to have:  Soreness or discomfort near the incision.  Some swelling or bruising near the incision.  Follow these instructions at home: Incision care  1.  Leave your outer dressing on for 24 hours.  After 24 hours you can remove your outer dressing and shower. 2. Leave adhesive strips in place. These skin closures may need to stay in place for 1-2 weeks. If adhesive strip edges start to loosen and curl up, you may trim the loose edges.  You may remove the strips if they have not fallen off after 2 weeks. 3. Check your incision area every day for signs of infection. Check for: a. Redness, swelling, or pain. b. Fluid or blood. c. Warmth. d. Pus or a bad  smell. 4. Do not take baths, swim, or use a hot tub until your incision is completely healed. 5. If your wound site starts to bleed apply pressure.      If you have any questions/concerns please call the device clinic at [phone].  Activity  Return to your normal activities.  Contact a health care provider if:  You have redness, swelling, or pain around your incision.  You have a fever.

## 2020-10-31 LAB — JAK2 (INCLUDING V617F AND EXON 12), MPL,& CALR-NEXT GEN SEQ

## 2020-11-05 DIAGNOSIS — M47816 Spondylosis without myelopathy or radiculopathy, lumbar region: Secondary | ICD-10-CM | POA: Diagnosis not present

## 2020-11-08 ENCOUNTER — Ambulatory Visit: Payer: Medicare Other | Admitting: Hematology

## 2020-11-12 NOTE — Progress Notes (Signed)
HEMATOLOGY/ONCOLOGY CONSULTATION NOTE  Date of Service: 11/13/2020  Patient Care Team: Shon Baton, MD as PCP - General (Internal Medicine) Thompson Grayer, MD as PCP - Electrophysiology (Cardiology)  CHIEF COMPLAINTS/PURPOSE OF CONSULTATION:  DVT  HISTORY OF PRESENTING ILLNESS:   Sarah Valdez is a wonderful 75 y.o. female who has been referred to [place] by Dr. Shon Baton, MD for evaluation and management of the source and cause of recent DVT and PE.The pt reports that she is doing well overall.  The pt has a history of cryptogenic stroke and dyslipidemia. The pt was in the hospital for an extensive bilateral sub-massive PE and extensive right low extremity DVT. The pt is currently on Eliquis after 48 hours of IV heparin. Her blood counts were normal when she wad admitted. She had significant blood clots in the heart and right leg.   The pt reports that she had a stroke in 2018 that was not very significant. The pt was unaware she had this, but experienced vertigo. She originally visited her PCP who sent her to the ENT.  She had an MRI brain that showed this was subcortical and white matter. The pt notes her dizziness has since resolved and went away. They are currently just monitoring things at this time. The pt was on ASA prior to recent clot experience.  The pt notes that she has been pregnant twice with no clotting problems and no miscarriages. She notes no familial history of clotting or blood disorders. The pt notes that years and years ago she was on birth control for three years. The pt notes  Her mother died suddenly of a severe heart attack. They did not do an autopsy or anything as such.  In the last six months, the pt was asymptomatic and did not feel any differences except feeling lethargic. She attributes this to the pandemic effects on lack of workout and less physical activity. The pt had a radiofrequency ablation in her spine in mid-January for her degenerative disks and arthritis  to help alleviate some pain. She notes this was outpatient procedures and has been improving since. The pt denies being completely immobile. The pt notes no other surgeries, steroids, hormonal medications, or new medication changes. The pt is up to date with all appropriate age related screenings with no concerns related on those.  The pt notes that she was experiencing SOB and chest pain that led to her CT getting an MRI that observed the PE. She got a D-Dimer done as well. The pt denies any leg swelling. This was one week prior to the PE/DVT. She notes a major change in over a week, with going upstairs even more difficult. The pt called the spine doctor for this and they explained this could not have been a side effect thus she called her PCP. She notes her blood tests were okay prior to the D-Dimer. They re-did the D-Dimer the next day due to issues with [facility]. During this time, the pt denies leg cramping or leg swelling. She notes some issues minor, intermittently in her right calf that was similar to mild cramping.   The pt denies any history of smoking. The pt notes she does have varicose veins in her right leg. The pt is currently at 30-40% her baseline activity. The pt has received her [place] and booster, but a year ago and not recent.The pt notes her sister has breast cancer.  On review of systems, pt reports SOB and denies unexpected  weight loss, obvious new symptoms, leg swelling, leg cramping, chest pain, and any other symptoms.  INTERVAL HISTORY  I connected with Sarah Valdez on 11/13/2020 by telephone and verified that I am speaking with the correct person using two identifiers.   I discussed the limitations of evaluation and management by telemedicine. The patient expressed understanding and agreed to proceed.   Other persons participating in the visit and their role in the encounter:                                                         - Reinaldo Raddle, Medical  Scribe     Patient's location: Home Provider's location: [place] [facility] [place] is a wonderful 75 y.o. female who is here today for evaluation and management of the source and cause of recent DVT and PE. The patient's last visit with [place] was on 10/24/2020. The pt reports that she is doing well overall.  The pt reports no new concerns. The pt notes that she has been feeling like some days it takes extra effort for deeper breaths. This is not consistent nor relates to major changes in breathing and SOB. She notes this is not bothersome.  Lab results 10/24/2020 of CBC w/diff and CMP is as follows: all values are WNL. 10/24/2020 Anticardiolipin IgG, IgM, IgA of <9. 10/24/2020 Prothrombin gene mutation not detected. 10/24/2020 Factor 5 leiden not detected. 10/24/2020 Beta-2 Glyco IgG, IgM, IgA of <9. 10/24/2020 Lupus anticoagulant not detected. 10/24/2020 Protein S total of 130, Activity of 91. 10/24/2020 Protein C Total of 142, Activity of 171. 10/24/2020 dRVVT Mix of 53.2, Confirm of 1.0.  10/24/2020 AntiThromb III Function of 112. 10/24/2020 JAK2 not detected.  On review of systems, pt denies SOB, changes in breathing, and any other symptoms.  MEDICAL HISTORY:  Past Medical History:  Diagnosis Date  . Allergy   . Bilateral sensorineural hearing loss 11/06/2016  . Cancer ([place])    skin cancer - on back, forehead  . Chronic congestive heart failure ([place]) 02/22/2018  . Cryptogenic stroke [facility])    followed by Dr Jaynee Eagles  . Diverticulosis of colon 2007   Dr Olevia Perches  . Gallstones 01/20/2014   12/2013 multiple gallstones, largest 1.7 cm in diameter; asymptomatic   . History of basal cell carcinoma excision    X3  . History of colon polyps    NON-CANCEROUS  . History of kidney stones   . Hyperlipidemia   . OA (osteoarthritis of spine)   . Palpitations 11/24/2007   Qualifier: Diagnosis of  By: Linna Darner MD, William    . Pancreatic cyst 01/20/2014   01/03/14 8 x 11 mm nonaggressive  cystic lesion the pancreas. Followup recommended in one year.   Marland Kitchen PONV (postoperative nausea and vomiting)   . PSVT (paroxysmal supraventricular tachycardia) ([facility])    adenosine sensitive SVT previously documented  . Right ureteral stone   . Urgency of urination   . Wears glasses     SURGICAL HISTORY: Past Surgical History:  Procedure Laterality Date  . APPENDECTOMY  1950  . 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 CLOSED MANIPULATION POST TOTAL  KNEE ARTHROPLASTY  07-13-2006  . COLONOSCOPY W/ POLYPECTOMY  2002     adenomatous;  Dr Olevia Perches  . epidural steroid injections     two rounds of injections; lumbar spine   . EXTRACORPOREAL SHOCK WAVE LITHOTRIPSY  X3  . FACELIFT W/BLEPHAROPLASTY  2005  . HYSTEROSCOPY WITH D & C  12-22-2000  . implantable loop recorder removal  10/29/2020   MDT Reveal LINQ removed by Dr Rayann Heman  . LOOP RECORDER INSERTION N/A 05/21/2017   Procedure: LOOP RECORDER INSERTION;  Surgeon: Thompson Grayer, MD;  Location: [facility];  Service: Cardiovascular;  Laterality: N/A;  . OPEN KNEE MENISECTOMY  1964  . RIGHT URETEROSCOPIC STONE EXTRACTION / STENT PLACEMENT  08-21-2008  . rotator cuff on right shoulder    . TONSILLECTOMY  1950  . TOTAL KNEE ARTHROPLASTY Right 05-30-2006  . TUBAL LIGATION    . WISDOM TOOTH EXTRACTION      SOCIAL HISTORY: Social History   Socioeconomic History  . Marital status: Married    Spouse name: Not on file  . Number of children: 2  . Years of education: Not on file  . Highest education level: Bachelor's degree (e.g., BA, AB, BS)  Occupational History  . Not on file  Tobacco Use  . Smoking status: Never Smoker  . Smokeless tobacco: Never Used  Vaping Use  . Vaping Use: Never used  Substance and Sexual Activity  . Alcohol use: Yes    Alcohol/week: 5.0 standard drinks    Types: 5 Glasses of wine per week  . Drug use: No  . Sexual activity: Not on  file  Other Topics Concern  . Not on file  Social History Narrative   Lives at home in [place] with spouse.    Retired Careers information officer.   Active in the community.   Right handed   Drinks 1 cup of caffeine daily   Social Determinants of Health   Financial Resource Strain: Not on file  Food Insecurity: Not on file  Transportation Needs: Not on file  Physical Activity: Not on file  Stress: Not on file  Social Connections: Not on file  Intimate Partner Violence: Not on file    FAMILY HISTORY: Family History  Problem Relation Age of Onset  . Heart attack Mother 65  . Emphysema Father        smoker  . Colon polyps Father   . Breast cancer Sister        late 9s  . Cancer Paternal Grandfather        throat cancer  . Esophageal cancer Paternal Grandfather   . Heart attack Maternal Grandfather 76  . Diabetes Neg Hx   . Stroke Neg Hx   . Colon cancer Neg Hx   . Rectal cancer Neg Hx   . Stomach cancer Neg Hx     ALLERGIES:  is allergic to morphine, penicillins, and levofloxacin.  MEDICATIONS:  Current Outpatient Medications  Medication Sig Dispense Refill  . alendronate (FOSAMAX) 70 MG tablet Take 70 mg by mouth once a week. Thursdays    . apixaban (ELIQUIS) 5 MG TABS tablet Take 1 tablet by mouth in the morning and at bedtime.    . cetirizine (ZYRTEC) 10 MG tablet Take 10 mg by mouth daily.    . Cholecalciferol (VITAMIN D) 2000 units CAPS Take 1 capsule at bedtime by mouth.     . diltiazem (CARDIZEM) 120 MG tablet Take 1 tablet (120 mg total) by mouth daily. Please keep upcoming appt in January 2022 with Dr. Rayann Heman before anymore refills. Thank you 90 tablet 0  .  pravastatin (PRAVACHOL) 40 MG tablet Take 40 mg by mouth daily.     No current facility-administered medications for this visit.    REVIEW OF SYSTEMS:   10 Point review of Systems was done is negative except as noted above.  PHYSICAL EXAMINATION: ECOG PERFORMANCE STATUS: 1 - Symptomatic but completely  ambulatory  . There were no vitals filed for this visit. There were no vitals filed for this visit. .There is no height or weight on file to calculate BMI.  Telehealth Visit.  LABORATORY DATA:  I have reviewed the data as listed  . CBC Latest Ref Rng & Units 10/24/2020 10/14/2020 10/13/2020  WBC 4.0 - 10.5 K/uL 8.1 8.0 7.6  Hemoglobin 12.0 - 15.0 g/dL 13.8 13.3 12.7  Hematocrit 36.0 - 46.0 % 41.2 41.3 36.8  Platelets 150 - 400 K/uL 208 167 146(L)    . CMP Latest Ref Rng & Units 10/24/2020 10/14/2020 10/14/2020  Glucose 70 - 99 mg/dL 98 140(H) 96  BUN 8 - 23 mg/dL 22 17 17   Creatinine 0.44 - 1.00 mg/dL 0.94 0.91 0.92  Sodium 135 - 145 mmol/L 141 137 142  Potassium 3.5 - 5.1 mmol/L 3.5 3.5 5.2(H)  Chloride 98 - 111 mmol/L 109 109 108  CO2 22 - 32 mmol/L 22 18(L) 26  Calcium 8.9 - 10.3 mg/dL 9.2 8.9 9.1  Total Protein 6.5 - 8.1 g/dL 7.0 - -  Total Bilirubin 0.3 - 1.2 mg/dL 0.4 - -  Alkaline Phos 38 - 126 U/L 56 - -  AST 15 - 41 U/L 19 - -  ALT 0 - 44 U/L 29 - -     RADIOGRAPHIC STUDIES: I have personally reviewed the radiological images as listed and agreed with the findings in the report. CUP PACEART REMOTE DEVICE CHECK  Result Date: 10/18/2020 ILR summary report received. Battery status OK. Normal device function. No new symptom, tachy, brady, or pause episodes. No new AF episodes. Monthly summary reports and ROV/PRN Kathy Breach, RN, CCDS, [employer]  10/24/2020 JAK2     ASSESSMENT & PLAN:   75 yo with   1) Unprovoked Rt LE DVT 2) B/L unprovoked submassive Pulmonary embolism  PLAN: -Discussed pt's most recent labwork, 10/24/2020; blood counts normal, blood chemistries normal, no polycythemia, no thrombocytosis, JAK2 mutation profile negative, cardiolipin antibodies negative, beta 2 glycoprotein antibodies negative, Lupus anticoagulant negative, Factor 5 leiden mutation negative, prothrombin gene mutation negative, protein S WNL, protein C WNL, antithrombin  III normal. -Advised pt that there is no evidence from labs that can explain the extent of the pt's clot. All results came back normal and are unable to reveal the provoking nature of the pt's clot.  -Discussed local risk factors for clotting-- the pt's varicose veins would not clearly the explain the extent of clot the pt experienced. -Discussed unprovoked clots and risk mitigation-- recommended pt continue long-term blood thinners. -Advised pt the risk of repeat clot from prior unprovoked clot is 8-15% each year.  -Advised pt the failure of blood thinners if taken properly is less than 2% each year, with a risk of major bleeding at less than 1% each year. -Continue use of compression socks on lower extremities, specifically right. -Recommended pt stay up to date with all age related screenings with PCP. -Recommended pt stay well hydrated and drink 48-64 oz water daily. -Recommended pt stay physically active and walk 20-30 min daily. -Continue Eliquis BID. -Will see back as needed. Will discharge blood thinner management to Dr. Virgina Jock.  FOLLOW UP: RTC with PCP.   No orders of the defined types were placed in this encounter.   All of the patients questions were answered with apparent satisfaction. The patient knows to call the clinic with any problems, questions or concerns.  The total time spent in the appointment was 20 minutes and more than 50% was on counseling and direct patient cares.     Sullivan Lone MD [place] AAHIVMS [facility] Hematology/Oncology Physician [facility]  (Office):       [phone] (Work cell):  [phone] (Fax):           [phone]  11/13/2020 2:44 PM  I, Reinaldo Raddle, am acting as scribe for Dr. Sullivan Lone, MD.   .I have reviewed the above documentation for accuracy and completeness, and I agree with the above. Brunetta Genera MD

## 2020-11-13 ENCOUNTER — Telehealth: Payer: Self-pay | Admitting: Hematology

## 2020-11-13 ENCOUNTER — Inpatient Hospital Stay (HOSPITAL_BASED_OUTPATIENT_CLINIC_OR_DEPARTMENT_OTHER): Payer: Medicare Other | Admitting: Hematology

## 2020-11-13 DIAGNOSIS — I639 Cerebral infarction, unspecified: Secondary | ICD-10-CM

## 2020-11-13 DIAGNOSIS — I2699 Other pulmonary embolism without acute cor pulmonale: Secondary | ICD-10-CM | POA: Diagnosis not present

## 2020-11-13 DIAGNOSIS — I82411 Acute embolism and thrombosis of right femoral vein: Secondary | ICD-10-CM | POA: Diagnosis not present

## 2020-11-13 DIAGNOSIS — Z7901 Long term (current) use of anticoagulants: Secondary | ICD-10-CM | POA: Diagnosis not present

## 2020-11-13 DIAGNOSIS — Z79899 Other long term (current) drug therapy: Secondary | ICD-10-CM | POA: Diagnosis not present

## 2020-11-13 DIAGNOSIS — Z8673 Personal history of transient ischemic attack (TIA), and cerebral infarction without residual deficits: Secondary | ICD-10-CM | POA: Diagnosis not present

## 2020-11-13 NOTE — Telephone Encounter (Signed)
Checked out appointment. No LOS notes needing to be scheduled. No changes made. 

## 2020-12-07 ENCOUNTER — Other Ambulatory Visit: Payer: Self-pay | Admitting: Internal Medicine

## 2020-12-18 DIAGNOSIS — H5203 Hypermetropia, bilateral: Secondary | ICD-10-CM | POA: Diagnosis not present

## 2020-12-18 DIAGNOSIS — H43813 Vitreous degeneration, bilateral: Secondary | ICD-10-CM | POA: Diagnosis not present

## 2020-12-18 DIAGNOSIS — H25813 Combined forms of age-related cataract, bilateral: Secondary | ICD-10-CM | POA: Diagnosis not present

## 2020-12-18 DIAGNOSIS — H353122 Nonexudative age-related macular degeneration, left eye, intermediate dry stage: Secondary | ICD-10-CM | POA: Diagnosis not present

## 2020-12-18 DIAGNOSIS — H524 Presbyopia: Secondary | ICD-10-CM | POA: Diagnosis not present

## 2020-12-18 DIAGNOSIS — H52223 Regular astigmatism, bilateral: Secondary | ICD-10-CM | POA: Diagnosis not present

## 2020-12-24 ENCOUNTER — Ambulatory Visit: Payer: Medicare Other | Admitting: Neurology

## 2020-12-31 DIAGNOSIS — M4316 Spondylolisthesis, lumbar region: Secondary | ICD-10-CM | POA: Diagnosis not present

## 2021-01-07 ENCOUNTER — Ambulatory Visit (INDEPENDENT_AMBULATORY_CARE_PROVIDER_SITE_OTHER): Payer: Medicare Other | Admitting: Neurology

## 2021-01-07 ENCOUNTER — Encounter: Payer: Self-pay | Admitting: Neurology

## 2021-01-07 VITALS — BP 115/78 | HR 73 | Ht 67.0 in | Wt 168.0 lb

## 2021-01-07 DIAGNOSIS — I639 Cerebral infarction, unspecified: Secondary | ICD-10-CM

## 2021-01-07 NOTE — Patient Instructions (Signed)
Patent Foramen Ovale, Adult  A foramen ovale is a hole between the upper chambers (right atrium and left atrium) of the heart. Before you are born, it is normal to have this hole in your heart. The hole allows blood to circulate through the body without having to go through the lungs. After your birth, when you are able to breathe, you do not need the foramen ovale and it usually closes. If the hole does not close, it is called a patent foramen ovale (PFO). PFO is a common condition. Most people do not know they have this hole, and they do not have any health problems caused by it. What are the causes? The cause of this condition is not known. What are the signs or symptoms? In most cases, there are no symptoms of this condition. Possible rare symptoms include:  Stroke caused by a blood clot.  Migraine headaches.  Platypnea-orthodeoxia syndrome. This is a condition in which a person has shortness of breath and decreased oxygen when seated or standing but feels better when lying down. How is this diagnosed? This condition may be diagnosed based on:  A physical exam and your medical history.  Echocardiogram. This test uses sound waves to produce images of the heart.  Transesophageal echocardiogram (TEE). This type of echocardiogram is performed by placing a probe in the part of the body that moves food from the mouth to the stomach (esophagus).  Electrocardiogram (ECG). This test identifies changes in the electrical activity of the heart.  Cardiac MRI. This is an imaging technique that is used to visualize the heart, if further images are needed after TEE. How is this treated? Usually, no treatment is needed. If your condition is associated with symptoms or blood clots, you may need:  Medicines to prevent blood clots and strokes (anticoagulant or antiplateletmedicines).  A surgical procedure to close the hole (transcatheter closure). Follow these instructions at home:  Take  over-the-counter and prescription medicines only as told by your health care provider.  Keep all follow-up visits. This is important. Contact a health care provider if:  You have a fever.  You have frequent or severe headaches. Get help right away if:  Your skin turns blue.  You have chest pain or difficulty breathing.  You have any symptoms of stroke. "BE FAST" is an easy way to remember the main warning signs of stroke: ? B - Balance. Signs are dizziness, sudden trouble walking, or loss of balance. ? E - Eyes. Signs are trouble seeing or a sudden change in vision. ? F - Face. Signs are sudden weakness or numbness of the face, or the face or eyelid drooping on one side. ? A - Arms. Signs are weakness or numbness in an arm. This happens suddenly and usually on one side of the body. ? S - Speech. Signs are sudden trouble speaking, slurred speech, or trouble understanding what people say. ? T - Time. Time to call emergency services. Write down what time symptoms started.  You have other signs of a stroke, such as: ? A sudden, severe headache with no known cause. ? Nausea or vomiting. ? Seizure. These symptoms may represent a serious problem that is an emergency. Do not wait to see if the symptoms will go away. Get medical help right away. Call your local emergency services (911 in the [place] Do not drive yourself to the hospital. Summary  A patent foramen ovale is a hole between the upper chambers (right atrium and left  atrium) of your heart. The cause of this condition is not known.  You may not know that you have a hole in your heart, and you may not have any health problems from it.  Usually, no treatment is needed for this condition unless you have symptoms or blood clots. This information is not intended to replace advice given to you by your health care provider. Make sure you discuss any questions you have with your health care provider. Document Revised: 06/14/2020 Document  Reviewed: 06/14/2020 Elsevier Patient Education  2021 [employer].

## 2021-01-07 NOTE — Progress Notes (Signed)
[facility]    rovider:  Dr Jaynee Eagles Referring Provider: Shon Baton, MD Primary Care Physician:  Shon Baton, MD CC: Stroke  Interval history Jan 07, 2021: This is a patient is seen in the past for stroke, dizziness, today she is here for new chief complaint of memory loss, she had a pulmonary embolism and her stroke, she is on Eliquis. We discussed cryptogenic stroke. We discussed any relation between her DVT and cryptogenic embolic stroke, we discussed pfo, she has some trouble with recall but she is not too concerned, we discussed normal cognitive aging. She sat on the couch a lot during covid and she was having injections for lumbar issues, maybe sedentary lifestyle? She had just had an injection in her low back, but she walks a lot. She had shortness of breath with the pfo and she couldn't even walk one of the aquares.  Interval history 09/07/2018: She still has bouts of dizziness. Ativan doesn;t really help. Last one was before christmas for several weeks. Epley maneuvers don't help. She doesn't drink enough water. It can happen anytime. SHe feels dizzy, "out of sorts", No perception of movement, she feels lightheaded, sitting down does not make it better. She knows it is there, she can still get up and function, no falls. No problems with loop recorder. Her BP meds were adjusted and decreased and still having the episodes. ENT appointment in the past was normal. Her blood pressure is always low. Discussed vestibular migraines.   Interval history 10/26/2017: She has intermittent dizziness.  She went to the ED. It was late morning and waited for 8 hours. Need to repeat MRI brain. Vertigo was going on for a week, it was more dizziness, new symptoms. Given previous cryptogenic stroke and questionable white matter changes need repeat MRI brain   Interval history 04/22/2017: Patient is here with her son for the first time. Spent an extended visit reviewing patient's clinical history,  reviewing images of initial MRI and most recent MRI of the brain, reviewing echocardiogram and vascular imaging, discussing next and possibility of atrial fibrillation or other etiologies of clot, discussed stroke, treatment for atrial fibrillation and answered all questions.  Interval history 03/05/2017: Patient is here for follow-up of subacute strokes that were seen on MRI of the brain. She was started on aspirin 325 as well as hemoglobin A1c, she follows with PCP for cholesterol and advised LDL goal is less than 70. CTA of the head showed no intracranial or extracranial stenosis or occlusion of significance. Echocardiogram of the heart showed no PFO or thrombus.  Cardiac telemetry did not show atrial fibrillation.  [id number] L Dowtinis a 75 y.o.femalehere as a referral from Dr. Minta Balsam stroke. She has a history of palpitations. 10 years ago she had extensive palpitations. MRi results showed subacute strokes. She has a history of supraventricular tacycardia. No history of afib. PMHx OA, HLD, She was not on anti-platelet prior to dizziness. Dizziness since September, waxes and wanes, sometimes it is room spinning or imbalance. Currently she is fine but a week ago it was more imbalance when standing. She had a fall at [place], she was rushing and she fell and hit her head on th left side. She bit her lip. Otherwise she can;t remember any event that may have happened in the last several months coinciding with the MRI results. She tends to walk and catches her toe and has falls. She has had vestibular therapy but never for gait abnormality or falls. No other focal neurologic  deficits, associated symptoms, inciting events or modifiable factors.  Reviewed notes, labs and imaging from outside physicians, which showed:  Reviewed ENT notes. She presented with a chief complaint of dizziness and hearing loss. This started last fall when she started feeling imbalance and standing. There has been some room  spinning. Onset was fairly sudden. PCP prescribed meclizine. The feeling comes and goes and goes away for a few weeks and comes back for a few weeks. Meclizine is helpful. The feeling is constant even when not moving. Head movements do not affect the feeling. She has white noise all the time that worsens. There is an associated pressure feeling in the head. When she stands the imbalance worsens. She has trouble hearing high-pitched sounds. She work with a physical therapist who did not find a problem. She has a remote history of migraines. Exam showed normal on exam, hearing test demonstrates symmetric sensorineural loss. Her imbalance symptoms not typical of an inner ear process. He recommended brain imaging.  Personally reviewed MRI images and agree with the following:  IMPRESSION: Four foci of low level restricted diffusion and contrast enhancement in the deep white matter of the left hemisphere probably consistent with late subacute small vessel infarctions. These could be embolic or watershed. No evidence of cortical infarction. The differential diagnosis does include demyelinating disease, but that is felt less likely.  Mild chronic small-vessel ischemic change of the hemispheric white matter in addition.  LDL 96 in 2016  Review of Systems: Patient complains of symptoms per HPI as well as the following symptoms: pulmonary embolus. Pertinent negatives and positives per HPI. All others negative    Social History   Socioeconomic History  . Marital status: Married    Spouse name: Not on file  . Number of children: 2  . Years of education: Not on file  . Highest education level: Bachelor's degree (e.g., BA, AB, BS)  Occupational History  . Not on file  Tobacco Use  . Smoking status: Never Smoker  . Smokeless tobacco: Never Used  Vaping Use  . Vaping Use: Never used  Substance and Sexual Activity  . Alcohol use: Yes    Alcohol/week: 5.0 standard drinks    Types: 5 Glasses of  wine per week    Comment: 5 or less   . Drug use: No  . Sexual activity: Not on file  Other Topics Concern  . Not on file  Social History Narrative   Lives at home in [place] with spouse.    Retired Careers information officer.   Active in the community.   Right handed   Drinks 1 cup of caffeine daily   Social Determinants of Health   Financial Resource Strain: Not on file  Food Insecurity: Not on file  Transportation Needs: Not on file  Physical Activity: Not on file  Stress: Not on file  Social Connections: Not on file  Intimate Partner Violence: Not on file    Family History  Problem Relation Age of Onset  . Heart attack Mother 73  . Emphysema Father        smoker  . Colon polyps Father   . Breast cancer Sister        late 32s  . Cancer Paternal Grandfather        throat cancer  . Esophageal cancer Paternal Grandfather   . Heart attack Maternal Grandfather 76  . Diabetes Neg Hx   . Stroke Neg Hx   . Colon cancer Neg Hx   . Rectal  cancer Neg Hx   . Stomach cancer Neg Hx     Past Medical History:  Diagnosis Date  . Allergy   . Bilateral sensorineural hearing loss 11/06/2016  . Cancer ([place])    skin cancer - on back, forehead  . Chronic congestive heart failure ([place]) 02/22/2018  . Cryptogenic stroke [facility])    followed by Dr Jaynee Eagles  . Diverticulosis of colon 2007   Dr Olevia Perches  . Gallstones 01/20/2014   12/2013 multiple gallstones, largest 1.7 cm in diameter; asymptomatic   . History of basal cell carcinoma excision    X3  . History of colon polyps    NON-CANCEROUS  . History of kidney stones   . Hyperlipidemia   . OA (osteoarthritis of spine)   . Palpitations 11/24/2007   Qualifier: Diagnosis of  By: Linna Darner MD, William    . Pancreatic cyst 01/20/2014   01/03/14 8 x 11 mm nonaggressive cystic lesion the pancreas. Followup recommended in one year.   Marland Kitchen PONV (postoperative nausea and vomiting)   . PSVT (paroxysmal supraventricular tachycardia) (HCC)    adenosine sensitive SVT  previously documented  . Pulmonary embolism ([place])   . Right ureteral stone   . Urgency of urination   . Wears glasses     Past Surgical History:  Procedure Laterality Date  . APPENDECTOMY  1950  . 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 CLOSED MANIPULATION POST TOTAL  KNEE ARTHROPLASTY  07-13-2006  . COLONOSCOPY W/ POLYPECTOMY  2002     adenomatous; Dr Olevia Perches  . epidural steroid injections     two rounds of injections; lumbar spine   . EXTRACORPOREAL SHOCK WAVE LITHOTRIPSY  X3  . FACELIFT W/BLEPHAROPLASTY  2005  . HYSTEROSCOPY WITH D & C  12-22-2000  . implantable loop recorder removal  10/29/2020   MDT Reveal LINQ removed by Dr Rayann Heman  . LOOP RECORDER INSERTION N/A 05/21/2017   Procedure: LOOP RECORDER INSERTION;  Surgeon: Thompson Grayer, MD;  Location: [place] [facility];  Service: Cardiovascular;  Laterality: N/A;  . OPEN KNEE MENISECTOMY  1964  . RIGHT URETEROSCOPIC STONE EXTRACTION / STENT PLACEMENT  08-21-2008  . rotator cuff on right shoulder    . TONSILLECTOMY  1950  . TOTAL KNEE ARTHROPLASTY Right 05-30-2006  . TUBAL LIGATION    . WISDOM TOOTH EXTRACTION      Current Outpatient Medications  Medication Sig Dispense Refill  . alendronate (FOSAMAX) 70 MG tablet Take 70 mg by mouth once a week. Thursdays    . apixaban (ELIQUIS) 5 MG TABS tablet Take 1 tablet by mouth in the morning and at bedtime.    . cetirizine (ZYRTEC) 10 MG tablet Take 10 mg by mouth daily.    . Cholecalciferol (VITAMIN D) 2000 units CAPS Take 1 capsule at bedtime by mouth.     . diltiazem (CARDIZEM) 120 MG tablet Take 1 tablet (120 mg total) by mouth daily. 90 tablet 3  . pravastatin (PRAVACHOL) 40 MG tablet Take 40 mg by mouth daily.     No current facility-administered medications for this visit.    Allergies as of 01/07/2021 - Review Complete 01/07/2021  Allergen Reaction Noted  . Morphine Other (See Comments) 01/19/2007   . Penicillins Rash 01/19/2007  . Levofloxacin Other (See Comments)     Vitals: BP 115/78 (BP Location: Right Arm, Patient Position: Sitting)   Pulse 73   Ht 5\' 7"  (1.702 m)  Wt 168 lb (76.2 kg)   BMI 26.31 kg/m  Last Weight:  Wt Readings from Last 1 Encounters:  01/07/21 168 lb (76.2 kg)   Last Height:   Ht Readings from Last 1 Encounters:  01/07/21 5\' 7"  (1.702 m)   Exam: NAD, pleasant                  Speech:    Speech is normal; fluent and spontaneous with normal comprehension.  Cognition:    The patient is oriented to person, place, and time;     recent and remote memory intact;     language fluent;    Cranial Nerves:    The pupils are equal, round, and reactive to light.Trigeminal sensation is intact and the muscles of mastication are normal. The face is symmetric. The palate elevates in the midline. Hearing intact. Voice is normal. Shoulder shrug is normal. The tongue has normal motion without fasciculations.   Coordination:  No dysmetria  Motor Observation:    No asymmetry, no atrophy, and no involuntary movements noted. Tone:    Normal muscle tone.     Strength:    Strength is V/V in the upper and lower limbs.      Sensation: intact to LT   Assessment/Plan:75 year old evaluated with dizziness. MRI showed foci of low level restricted diffusion and contrast enhancement in the deep white matter of the left hemisphere probably consistent with late subacute embolic infarctions. However the stroke not c/w timeframe of start of dizzy spells 6 months prior.  She has had a entire stroke evaluation.  This is a patient is seen in the past for stroke., she had a pulmonary embolism, she is on Eliquis. We discussed cryptogenic stroke. We discussed any relation between her DVT and cryptogenic embolic stroke, we discussed pfo, she has some trouble with recall but she is not too concerned, we discussed normal cognitive aging. Unprovoked DVT.   PRIOR Assesment and lan:    Dizziness may be due to vestibular migraines, discussed at length with patient. When you feel lightheaded take several blood pressure readings and drink lots of fluid May consider vestibular migraines (gave info from [employer] site) Will check thyroid panel due to elevated tsh Continue asa and managing stroke risk factors  Prior: Workup including CTA head and neck, echo, holter monitor unrevealing.  Patient will need loop recorder, will refer to Dr. Joylene Grapes: completed Physical therapy for gait and imbalance, falls: completed Needs vestibular therapy for persistent vertigo: completed Repeat MRI brain w/wo contrast with stroke and MS protocol due to worsening symptoms with thin cuts through the IACs due to questionable strokes vs MS int he past, abnormal white matter disease, evaluate for other leasions such as schwannoma  Continue ASA 325mg  daily  No orders of the defined types were placed in this encounter.    Sarina Ill, MD  [facility] [address] [place], [place] [address]  Phone [phone] Fax [phone]  I spent 30 minutes of face-to-face and non-face-to-face time with patient on the  1. Cryptogenic stroke ([place])    diagnosis.  This included previsit chart review, lab review, study review, order entry, electronic health record documentation, patient education on the different diagnostic and therapeutic options, counseling and coordination of care, risks and benefits of management, compliance, or risk factor reduction

## 2021-01-14 ENCOUNTER — Ambulatory Visit (HOSPITAL_COMMUNITY)
Admission: RE | Admit: 2021-01-14 | Discharge: 2021-01-14 | Disposition: A | Discharge status: Home / Self Care | Payer: Medicare Other | Source: Ambulatory Visit | Attending: Internal Medicine | Admitting: Internal Medicine

## 2021-01-14 ENCOUNTER — Other Ambulatory Visit: Payer: Self-pay

## 2021-01-14 ENCOUNTER — Other Ambulatory Visit (HOSPITAL_COMMUNITY): Payer: Self-pay | Admitting: Internal Medicine

## 2021-01-14 DIAGNOSIS — E785 Hyperlipidemia, unspecified: Secondary | ICD-10-CM | POA: Diagnosis not present

## 2021-01-14 DIAGNOSIS — Z7901 Long term (current) use of anticoagulants: Secondary | ICD-10-CM | POA: Diagnosis not present

## 2021-01-14 DIAGNOSIS — R002 Palpitations: Secondary | ICD-10-CM | POA: Diagnosis not present

## 2021-01-14 DIAGNOSIS — D692 Other nonthrombocytopenic purpura: Secondary | ICD-10-CM | POA: Diagnosis not present

## 2021-01-14 DIAGNOSIS — M810 Age-related osteoporosis without current pathological fracture: Secondary | ICD-10-CM | POA: Diagnosis not present

## 2021-01-14 DIAGNOSIS — K760 Fatty (change of) liver, not elsewhere classified: Secondary | ICD-10-CM | POA: Diagnosis not present

## 2021-01-14 DIAGNOSIS — R030 Elevated blood-pressure reading, without diagnosis of hypertension: Secondary | ICD-10-CM | POA: Diagnosis not present

## 2021-01-14 DIAGNOSIS — R600 Localized edema: Secondary | ICD-10-CM

## 2021-01-14 DIAGNOSIS — D6859 Other primary thrombophilia: Secondary | ICD-10-CM | POA: Diagnosis not present

## 2021-01-14 DIAGNOSIS — I2699 Other pulmonary embolism without acute cor pulmonale: Secondary | ICD-10-CM | POA: Diagnosis not present

## 2021-01-14 DIAGNOSIS — I513 Intracardiac thrombosis, not elsewhere classified: Secondary | ICD-10-CM | POA: Diagnosis not present

## 2021-01-14 DIAGNOSIS — I8290 Acute embolism and thrombosis of unspecified vein: Secondary | ICD-10-CM | POA: Diagnosis not present

## 2021-01-14 DIAGNOSIS — I6990 Unspecified sequelae of unspecified cerebrovascular disease: Secondary | ICD-10-CM | POA: Diagnosis not present

## 2021-01-30 ENCOUNTER — Other Ambulatory Visit (HOSPITAL_COMMUNITY): Payer: Medicare Other

## 2021-02-07 ENCOUNTER — Ambulatory Visit (HOSPITAL_COMMUNITY): Payer: Medicare Other | Attending: Cardiology

## 2021-02-07 ENCOUNTER — Other Ambulatory Visit: Payer: Self-pay

## 2021-02-07 DIAGNOSIS — I2699 Other pulmonary embolism without acute cor pulmonale: Secondary | ICD-10-CM | POA: Diagnosis not present

## 2021-02-07 DIAGNOSIS — I361 Nonrheumatic tricuspid (valve) insufficiency: Secondary | ICD-10-CM | POA: Diagnosis not present

## 2021-02-07 DIAGNOSIS — R002 Palpitations: Secondary | ICD-10-CM | POA: Insufficient documentation

## 2021-02-07 DIAGNOSIS — E785 Hyperlipidemia, unspecified: Secondary | ICD-10-CM | POA: Insufficient documentation

## 2021-02-07 DIAGNOSIS — I2609 Other pulmonary embolism with acute cor pulmonale: Secondary | ICD-10-CM | POA: Diagnosis not present

## 2021-02-07 DIAGNOSIS — I371 Nonrheumatic pulmonary valve insufficiency: Secondary | ICD-10-CM | POA: Diagnosis not present

## 2021-02-07 DIAGNOSIS — Z86718 Personal history of other venous thrombosis and embolism: Secondary | ICD-10-CM | POA: Insufficient documentation

## 2021-02-07 LAB — ECHOCARDIOGRAM COMPLETE
Area-P 1/2: 4.33 cm2
S' Lateral: 3.1 cm

## 2021-02-27 ENCOUNTER — Other Ambulatory Visit (HOSPITAL_COMMUNITY): Payer: Self-pay | Admitting: Internal Medicine

## 2021-02-27 DIAGNOSIS — I513 Intracardiac thrombosis, not elsewhere classified: Secondary | ICD-10-CM

## 2021-03-19 DIAGNOSIS — D692 Other nonthrombocytopenic purpura: Secondary | ICD-10-CM | POA: Diagnosis not present

## 2021-03-19 DIAGNOSIS — I872 Venous insufficiency (chronic) (peripheral): Secondary | ICD-10-CM | POA: Diagnosis not present

## 2021-03-19 DIAGNOSIS — L570 Actinic keratosis: Secondary | ICD-10-CM | POA: Diagnosis not present

## 2021-03-19 DIAGNOSIS — Z85828 Personal history of other malignant neoplasm of skin: Secondary | ICD-10-CM | POA: Diagnosis not present

## 2021-03-19 DIAGNOSIS — L738 Other specified follicular disorders: Secondary | ICD-10-CM | POA: Diagnosis not present

## 2021-03-19 DIAGNOSIS — D0439 Carcinoma in situ of skin of other parts of face: Secondary | ICD-10-CM | POA: Diagnosis not present

## 2021-03-19 DIAGNOSIS — L814 Other melanin hyperpigmentation: Secondary | ICD-10-CM | POA: Diagnosis not present

## 2021-03-19 DIAGNOSIS — D485 Neoplasm of uncertain behavior of skin: Secondary | ICD-10-CM | POA: Diagnosis not present

## 2021-03-19 DIAGNOSIS — L821 Other seborrheic keratosis: Secondary | ICD-10-CM | POA: Diagnosis not present

## 2021-03-19 DIAGNOSIS — D1801 Hemangioma of skin and subcutaneous tissue: Secondary | ICD-10-CM | POA: Diagnosis not present

## 2021-03-19 DIAGNOSIS — L308 Other specified dermatitis: Secondary | ICD-10-CM | POA: Diagnosis not present

## 2021-04-17 DIAGNOSIS — L650 Telogen effluvium: Secondary | ICD-10-CM | POA: Diagnosis not present

## 2021-04-17 DIAGNOSIS — R5383 Other fatigue: Secondary | ICD-10-CM | POA: Diagnosis not present

## 2021-04-17 DIAGNOSIS — Z79899 Other long term (current) drug therapy: Secondary | ICD-10-CM | POA: Diagnosis not present

## 2021-05-02 DIAGNOSIS — Z23 Encounter for immunization: Secondary | ICD-10-CM | POA: Diagnosis not present

## 2021-05-09 DIAGNOSIS — R6884 Jaw pain: Secondary | ICD-10-CM | POA: Diagnosis not present

## 2021-05-09 DIAGNOSIS — H9201 Otalgia, right ear: Secondary | ICD-10-CM | POA: Diagnosis not present

## 2021-06-03 DIAGNOSIS — L218 Other seborrheic dermatitis: Secondary | ICD-10-CM | POA: Diagnosis not present

## 2021-06-03 DIAGNOSIS — Z85828 Personal history of other malignant neoplasm of skin: Secondary | ICD-10-CM | POA: Diagnosis not present

## 2021-06-03 DIAGNOSIS — L650 Telogen effluvium: Secondary | ICD-10-CM | POA: Diagnosis not present

## 2021-06-24 DIAGNOSIS — T148XXA Other injury of unspecified body region, initial encounter: Secondary | ICD-10-CM | POA: Diagnosis not present

## 2021-06-24 DIAGNOSIS — M542 Cervicalgia: Secondary | ICD-10-CM | POA: Diagnosis not present

## 2021-07-02 ENCOUNTER — Encounter: Payer: Self-pay | Admitting: Gastroenterology

## 2021-08-20 ENCOUNTER — Other Ambulatory Visit: Payer: Self-pay | Admitting: Internal Medicine

## 2021-08-20 DIAGNOSIS — Z1231 Encounter for screening mammogram for malignant neoplasm of breast: Secondary | ICD-10-CM

## 2021-09-02 DIAGNOSIS — M810 Age-related osteoporosis without current pathological fracture: Secondary | ICD-10-CM | POA: Diagnosis not present

## 2021-09-24 DIAGNOSIS — R030 Elevated blood-pressure reading, without diagnosis of hypertension: Secondary | ICD-10-CM | POA: Diagnosis not present

## 2021-09-24 DIAGNOSIS — E785 Hyperlipidemia, unspecified: Secondary | ICD-10-CM | POA: Diagnosis not present

## 2021-09-24 DIAGNOSIS — E559 Vitamin D deficiency, unspecified: Secondary | ICD-10-CM | POA: Diagnosis not present

## 2021-09-30 ENCOUNTER — Encounter: Payer: Medicare Other | Admitting: Internal Medicine

## 2021-10-01 DIAGNOSIS — K760 Fatty (change of) liver, not elsewhere classified: Secondary | ICD-10-CM | POA: Diagnosis not present

## 2021-10-01 DIAGNOSIS — D692 Other nonthrombocytopenic purpura: Secondary | ICD-10-CM | POA: Diagnosis not present

## 2021-10-01 DIAGNOSIS — I2699 Other pulmonary embolism without acute cor pulmonale: Secondary | ICD-10-CM | POA: Diagnosis not present

## 2021-10-01 DIAGNOSIS — Z7901 Long term (current) use of anticoagulants: Secondary | ICD-10-CM | POA: Diagnosis not present

## 2021-10-01 DIAGNOSIS — Z1212 Encounter for screening for malignant neoplasm of rectum: Secondary | ICD-10-CM | POA: Diagnosis not present

## 2021-10-01 DIAGNOSIS — Z23 Encounter for immunization: Secondary | ICD-10-CM | POA: Diagnosis not present

## 2021-10-01 DIAGNOSIS — R82998 Other abnormal findings in urine: Secondary | ICD-10-CM | POA: Diagnosis not present

## 2021-10-01 DIAGNOSIS — M810 Age-related osteoporosis without current pathological fracture: Secondary | ICD-10-CM | POA: Diagnosis not present

## 2021-10-01 DIAGNOSIS — E559 Vitamin D deficiency, unspecified: Secondary | ICD-10-CM | POA: Diagnosis not present

## 2021-10-01 DIAGNOSIS — Z0000 Encounter for general adult medical examination without abnormal findings: Secondary | ICD-10-CM | POA: Diagnosis not present

## 2021-10-01 DIAGNOSIS — E785 Hyperlipidemia, unspecified: Secondary | ICD-10-CM | POA: Diagnosis not present

## 2021-10-01 DIAGNOSIS — M1990 Unspecified osteoarthritis, unspecified site: Secondary | ICD-10-CM | POA: Diagnosis not present

## 2021-10-01 DIAGNOSIS — D6859 Other primary thrombophilia: Secondary | ICD-10-CM | POA: Diagnosis not present

## 2021-10-01 DIAGNOSIS — I8290 Acute embolism and thrombosis of unspecified vein: Secondary | ICD-10-CM | POA: Diagnosis not present

## 2021-10-04 ENCOUNTER — Ambulatory Visit: Payer: Medicare Other

## 2021-10-18 ENCOUNTER — Ambulatory Visit
Admission: RE | Admit: 2021-10-18 | Discharge: 2021-10-18 | Disposition: A | Discharge status: Home / Self Care | Payer: Medicare Other | Source: Ambulatory Visit | Attending: Internal Medicine | Admitting: Internal Medicine

## 2021-10-18 DIAGNOSIS — Z1231 Encounter for screening mammogram for malignant neoplasm of breast: Secondary | ICD-10-CM | POA: Diagnosis not present

## 2021-10-24 ENCOUNTER — Encounter: Payer: Self-pay | Admitting: Gastroenterology

## 2021-10-31 DIAGNOSIS — L570 Actinic keratosis: Secondary | ICD-10-CM | POA: Diagnosis not present

## 2021-11-25 ENCOUNTER — Ambulatory Visit: Payer: Medicare Other | Admitting: Gastroenterology

## 2021-11-29 DIAGNOSIS — M25562 Pain in left knee: Secondary | ICD-10-CM | POA: Diagnosis not present

## 2021-12-09 ENCOUNTER — Other Ambulatory Visit: Payer: Self-pay | Admitting: Internal Medicine

## 2021-12-12 ENCOUNTER — Encounter: Payer: Self-pay | Admitting: Nurse Practitioner

## 2021-12-12 ENCOUNTER — Ambulatory Visit (INDEPENDENT_AMBULATORY_CARE_PROVIDER_SITE_OTHER): Payer: Medicare Other | Admitting: Nurse Practitioner

## 2021-12-12 VITALS — BP 112/66 | HR 78 | Ht 67.0 in | Wt 172.0 lb

## 2021-12-12 DIAGNOSIS — Z860100 Personal history of colon polyps, unspecified: Secondary | ICD-10-CM

## 2021-12-12 DIAGNOSIS — Z7901 Long term (current) use of anticoagulants: Secondary | ICD-10-CM

## 2021-12-12 NOTE — Progress Notes (Signed)
? ? ?Assessment  ? ?Patient profile:  ?Sarah Valdez is a 76 y.o. female known to Dr. Silverio Decamp.  Referred by PCP for history of colon polyps. Her past medical history is significant for colon polyps, DVT / PE on Eliquis, CVA. See PMH below for any additional history ? ?History of adenomatous colon polyps. A 3 mm tubular adenoma was removed in 2017.  ? ?DVT / PE of unclear etiology. Evaluated by Hematology in March 2022. On Eliquis.  ? ?Plan  ? ?Based on updated polyp surveillance guidelines a 7-10 year interval colonoscopy is recommended. Therefore she would not be due until Oct 2024.at the earliest. Will discuss with Dr. Silverio Decamp to make sure she feels patient falls within these guidelines based on last colonoscopy findings Though there was restricted mobility of the colon the exam was complete and the bowel prep was deemed good .  ?In light of her need for anti-coagulation she is understandably nervous about holding blood thinner for procedure. She is even inquiring about the risks of not undergoing surveillance colonoscopy ( at any point). Will defer to Dr. Silverio Decamp.  ? ? ?History of Present Illness  ? ?Chief complaint: Time for colonoscopy ? ?[name] says she was due for her 5-year colonoscopy last year.  However this was not done as she was diagnosed with a DVT/PE and started on Eliquis.  She is here now to have the procedure scheduled.  She has no bowel changes, abdominal pain, blood in stool , weight loss or other alarm features.  Hemoglobin is 13.8.  No family history of colon cancer ? ? ?Previous Labs / Imaging:: ? ?  Latest Ref Rng & Units 10/24/2020  ? 12:46 PM 10/14/2020  ?  4:09 AM 10/13/2020  ?  4:29 AM  ?CBC  ?WBC 4.0 - 10.5 K/uL 8.1   8.0   7.6    ?Hemoglobin 12.0 - 15.0 g/dL 13.8   13.3   12.7    ?Hematocrit 36.0 - 46.0 % 41.2   41.3   36.8    ?Platelets 150 - 400 K/uL 208   167   146    ? ? ?No results found for: LIPASE ? ?  Latest Ref Rng & Units 10/24/2020  ? 12:46 PM 10/14/2020  ?  9:40 AM 10/14/2020  ?   4:09 AM  ?CMP  ?Glucose 70 - 99 mg/dL 98   140   96    ?BUN 8 - 23 mg/dL '22   17   17    '$ ?Creatinine 0.44 - 1.00 mg/dL 0.94   0.91   0.92    ?Sodium 135 - 145 mmol/L 141   137   142    ?Potassium 3.5 - 5.1 mmol/L 3.5   3.5   5.2    ?Chloride 98 - 111 mmol/L 109   109   108    ?CO2 22 - 32 mmol/L '22   18   26    '$ ?Calcium 8.9 - 10.3 mg/dL 9.2   8.9   9.1    ?Total Protein 6.5 - 8.1 g/dL 7.0      ?Total Bilirubin 0.3 - 1.2 mg/dL 0.4      ?Alkaline Phos 38 - 126 U/L 56      ?AST 15 - 41 U/L 19      ?ALT 0 - 44 U/L 29      ? ? ?Previous GI Evaluations  ? ?Endoscopies: ?Oct 2017 colonoscopy.  ?--Multiple diverticula, restricted mobility ?- One 3 mm  polyp in the cecum, removed with a cold biopsy forceps. Resected and retrieved. ?- Diverticulosis in the sigmoid colon. ?- Non-bleeding internal hemorrhoids. ? ?Polyp was tubular adenoma  ? ?Imaging:  ?MM 3D SCREEN BREAST BILATERAL ?CLINICAL DATA:  Screening. ? ?EXAM: ?DIGITAL SCREENING BILATERAL MAMMOGRAM WITH TOMOSYNTHESIS AND CAD ? ?TECHNIQUE: ?Bilateral screening digital craniocaudal and mediolateral oblique ?mammograms were obtained. Bilateral screening digital breast ?tomosynthesis was performed. The images were evaluated with ?computer-aided detection. ? ?COMPARISON:  Previous exam(s). ? ?ACR Breast Density Category b: There are scattered areas of ?fibroglandular density. ? ?FINDINGS: ?There are no findings suspicious for malignancy. ? ?IMPRESSION: ?No mammographic evidence of malignancy. A result letter of this ?screening mammogram will be mailed directly to the patient. ? ?RECOMMENDATION: ?Screening mammogram in one year. (Code:SM-B-01Y) ? ?BI-RADS CATEGORY  1: Negative. ? ?Electronically Signed ?  By: Franki Cabot M.D. ?  On: 10/21/2021 11:11 ? ? ?Past Medical History:  ?Diagnosis Date  ? Allergy   ? Bilateral sensorineural hearing loss 11/06/2016  ? Cancer [facility])   ? skin cancer - on back, forehead  ? Chronic congestive heart failure ([place]) 02/22/2018  ? Cryptogenic  stroke ([place])   ? followed by Dr Jaynee Eagles  ? Diverticulosis of colon 2007  ? Dr Olevia Perches  ? Gallstones 01/20/2014  ? 12/2013 multiple gallstones, largest 1.7 cm in diameter; asymptomatic   ? History of basal cell carcinoma excision   ? X3  ? History of colon polyps   ? NON-CANCEROUS  ? History of kidney stones   ? Hyperlipidemia   ? OA (osteoarthritis of spine)   ? Palpitations 11/24/2007  ? Qualifier: Diagnosis of  By: Linna Darner MD, William    ? Pancreatic cyst 01/20/2014  ? 01/03/14 8 x 11 mm nonaggressive cystic lesion the pancreas. Followup recommended in one year.   ? PONV (postoperative nausea and vomiting)   ? PSVT (paroxysmal supraventricular tachycardia) ([place])   ? adenosine sensitive SVT previously documented  ? Pulmonary embolism ([place])   ? Right ureteral stone   ? Urgency of urination   ? Wears glasses   ? ?Past Surgical History:  ?Procedure Laterality Date  ? APPENDECTOMY  1950  ? CARPECTOMY WITH RADIAL STYLOIDECTOMY Right 09-12-2010  ? AND NEURECTOMY  OF WRIST (STAGE III KIENBOCK DISEASE)  ? CLOSED MANIPULATION POST TOTAL  KNEE ARTHROPLASTY  07-13-2006  ? COLONOSCOPY W/ POLYPECTOMY  2002    ? adenomatous; Dr Olevia Perches  ? epidural steroid injections    ? two rounds of injections; lumbar spine   ? EXTRACORPOREAL SHOCK WAVE LITHOTRIPSY  X3  ? FACELIFT W/BLEPHAROPLASTY  2005  ? HYSTEROSCOPY WITH D & C  12-22-2000  ? implantable loop recorder removal  10/29/2020  ? MDT Reveal LINQ removed by Dr Rayann Heman  ? LOOP RECORDER INSERTION N/A 05/21/2017  ? Procedure: LOOP RECORDER INSERTION;  Surgeon: Thompson Grayer, MD;  Location: [place] [facility];  Service: Cardiovascular;  Laterality: N/A;  ? OPEN KNEE MENISECTOMY  1964  ? RIGHT URETEROSCOPIC STONE EXTRACTION / STENT PLACEMENT  08-21-2008  ? rotator cuff on right shoulder    ? TONSILLECTOMY  1950  ? TOTAL KNEE ARTHROPLASTY Right 05-30-2006  ? TUBAL LIGATION    ? WISDOM TOOTH EXTRACTION    ? ?Family History  ?Problem Relation Age of Onset  ? Heart attack Mother 58  ? Emphysema Father    ?     smoker  ? Colon polyps Father   ? Breast cancer Sister   ?  late 43s  ? Cancer Paternal Grandfather   ?     throat cancer  ? Esophageal cancer Paternal Grandfather   ? Heart attack Maternal Grandfather 31  ? Diabetes Neg Hx   ? Stroke Neg Hx   ? Colon cancer Neg Hx   ? Rectal cancer Neg Hx   ? Stomach cancer Neg Hx   ? ?Social History  ? ?Tobacco Use  ? Smoking status: Never  ? Smokeless tobacco: Never  ?Vaping Use  ? Vaping Use: Never used  ?Substance Use Topics  ? Alcohol use: Yes  ?  Alcohol/week: 5.0 standard drinks  ?  Types: 5 Glasses of wine per week  ?  Comment: 5 or less   ? Drug use: No  ? ?Current Outpatient Medications  ?Medication Sig Dispense Refill  ? alendronate (FOSAMAX) 70 MG tablet Take 70 mg by mouth once a week. Thursdays    ? apixaban (ELIQUIS) 5 MG TABS tablet Take 1 tablet by mouth in the morning and at bedtime.    ? cetirizine (ZYRTEC) 10 MG tablet Take 10 mg by mouth daily.    ? Cholecalciferol (VITAMIN D) 2000 units CAPS Take 1 capsule at bedtime by mouth.     ? diltiazem (CARDIZEM) 120 MG tablet TAKE ONE TABLET BY MOUTH DAILY 30 tablet 0  ? pravastatin (PRAVACHOL) 40 MG tablet Take 40 mg by mouth daily.    ? ?No current facility-administered medications for this visit.  ? ?Allergies  ?Allergen Reactions  ? Morphine Other (See Comments)  ?  Respiratory compromise post op  ? Penicillins Rash  ?  Has patient had a PCN reaction causing immediate rash, facial/tongue/throat swelling, SOB or lightheadedness with hypotension: No ?Has patient had a PCN reaction causing severe rash involving mucus membranes or skin necrosis: No ?Has patient had a PCN reaction that required hospitalization: No ?Has patient had a PCN reaction occurring within the last 10 years: No ?If all of the above answers are "NO", then may proceed with Cephalosporin use. ?urticaria  ? Levofloxacin Other (See Comments)  ?   ?SHOULDER PAIN  ? ? ?Review of Systems: ?All systems reviewed and negative except where noted in  HPI.  ? ?Physical Exam  ? ?Wt Readings from Last 3 Encounters:  ?12/12/21 172 lb (78 kg)  ?01/07/21 168 lb (76.2 kg)  ?10/29/20 170 lb 12.8 oz (77.5 kg)  ? ? ?BP 112/66   Pulse 78   Ht '5\' 7"'$  (1.702 m)

## 2021-12-12 NOTE — Patient Instructions (Signed)
If you are age 78 or older, your body mass index should be between 23-30. Your Body mass index is 26.94 kg/m?Marland Kitchen If this is out of the aforementioned range listed, please consider follow up with your Primary Care Provider. ?________________________________________________________ ? ?The [facility] providers would like to encourage you to use [facility] to communicate with providers for non-urgent requests or questions.  Due to long hold times on the telephone, sending your provider a message by [facility] may be a faster and more efficient way to get a response.  Please allow 48 business hours for a response.  Please remember that this is for non-urgent requests.  ?_______________________________________________________ ? ?At this time you are not due for a colonoscopy.  Dr Silverio Decamp will review your chart and make further recommendations if needed. ? ?Thank you for entrusting me with your care and choosing [facility]. ? ?Tye Savoy, NP ?

## 2022-01-16 NOTE — Progress Notes (Signed)
Reviewed and agree with documentation and assessment and plan. K. Veena Seanmichael Salmons , MD   

## 2022-01-22 DIAGNOSIS — H35362 Drusen (degenerative) of macula, left eye: Secondary | ICD-10-CM | POA: Diagnosis not present

## 2022-01-22 DIAGNOSIS — H43813 Vitreous degeneration, bilateral: Secondary | ICD-10-CM | POA: Diagnosis not present

## 2022-01-22 DIAGNOSIS — H04123 Dry eye syndrome of bilateral lacrimal glands: Secondary | ICD-10-CM | POA: Diagnosis not present

## 2022-01-22 DIAGNOSIS — H25813 Combined forms of age-related cataract, bilateral: Secondary | ICD-10-CM | POA: Diagnosis not present

## 2022-03-05 ENCOUNTER — Encounter: Payer: Self-pay | Admitting: Internal Medicine

## 2022-03-05 ENCOUNTER — Ambulatory Visit (INDEPENDENT_AMBULATORY_CARE_PROVIDER_SITE_OTHER): Payer: Medicare Other | Admitting: Internal Medicine

## 2022-03-05 VITALS — BP 120/70 | HR 84 | Ht 67.0 in | Wt 171.6 lb

## 2022-03-05 DIAGNOSIS — I471 Supraventricular tachycardia, unspecified: Secondary | ICD-10-CM

## 2022-03-05 DIAGNOSIS — I2699 Other pulmonary embolism without acute cor pulmonale: Secondary | ICD-10-CM | POA: Diagnosis not present

## 2022-03-05 NOTE — Patient Instructions (Addendum)
Medication Instructions:  Your physician recommends that you continue on your current medications as directed. Please refer to the Current Medication list given to you today. *If you need a refill on your cardiac medications before your next appointment, please call your pharmacy*  Lab Work: None ordered. If you have labs (blood work) drawn today and your tests are completely normal, you will receive your results only by: [place] (if you have MyChart) OR A paper copy in the mail If you have any lab test that is abnormal or we need to change your treatment, we will call you to review the results.  Testing/Procedures: None ordered.  Follow-Up: At [facility], you and your health needs are our priority.  As part of our continuing mission to provide you with exceptional heart care, we have created designated Provider Care Teams.  These Care Teams include your primary Cardiologist (physician) and Advanced Practice Providers (APPs -  Physician Assistants and Nurse Practitioners) who all work together to provide you with the care you need, when you need it.  Your next appointment:   Your physician wants you to follow-up in: one year Tommye Standard, Orlinda Blalock "Jonni Sanger" Spencer, PA-C You will receive a reminder letter in the mail two months in advance. If you don't receive a letter, please call our office to schedule the follow-up appointment.   Important Information About [place]

## 2022-03-05 NOTE — Progress Notes (Signed)
PCP: Shon Baton, MD   Primary EP: Dr Rayann Heman  Sarah Valdez is a 76 y.o. female who presents today for routine electrophysiology followup.  Since last being seen in our clinic, the patient reports doing very well.  Today, she denies symptoms of palpitations, chest pain, shortness of breath,  lower extremity edema, dizziness, presyncope, or syncope.  The patient is otherwise without complaint today.   Past Medical History:  Diagnosis Date   Allergy    Bilateral sensorineural hearing loss 11/06/2016   Cancer ([place])    skin cancer - on back, forehead   Chronic congestive heart failure ([place]) 02/22/2018   Cryptogenic stroke ([place])    followed by Dr Jaynee Eagles   Diverticulosis of colon 2007   Dr Olevia Perches   Gallstones 01/20/2014   12/2013 multiple gallstones, largest 1.7 cm in diameter; asymptomatic    History of basal cell carcinoma excision    X3   History of colon polyps    NON-CANCEROUS   History of kidney stones    Hyperlipidemia    OA (osteoarthritis of spine)    Palpitations 11/24/2007   Qualifier: Diagnosis of  By: Linna Darner MD, William     Pancreatic cyst 01/20/2014   01/03/14 8 x 11 mm nonaggressive cystic lesion the pancreas. Followup recommended in one year.    PONV (postoperative nausea and vomiting)    PSVT (paroxysmal supraventricular tachycardia) (HCC)    adenosine sensitive SVT previously documented   Pulmonary embolism ([place])    Right ureteral stone    Urgency of urination    Wears glasses    Past Surgical History:  Procedure Laterality Date   APPENDECTOMY  1950   CARPECTOMY WITH RADIAL STYLOIDECTOMY Right 09-12-2010   AND NEURECTOMY  OF WRIST (STAGE III KIENBOCK DISEASE)   CLOSED MANIPULATION POST TOTAL  KNEE ARTHROPLASTY  07-13-2006   COLONOSCOPY W/ POLYPECTOMY  2002     adenomatous; Dr Olevia Perches   epidural steroid injections     two rounds of injections; lumbar spine    EXTRACORPOREAL SHOCK WAVE LITHOTRIPSY  X3   FACELIFT W/BLEPHAROPLASTY  2005   HYSTEROSCOPY WITH D & C   12-22-2000   implantable loop recorder removal  10/29/2020   MDT Reveal LINQ removed by Dr Rayann Heman   LOOP RECORDER INSERTION N/A 05/21/2017   Procedure: LOOP RECORDER INSERTION;  Surgeon: Thompson Grayer, MD;  Location: [facility];  Service: Cardiovascular;  Laterality: N/A;   OPEN KNEE MENISECTOMY  1964   RIGHT URETEROSCOPIC STONE EXTRACTION / STENT PLACEMENT  08-21-2008   rotator cuff on right shoulder     TONSILLECTOMY  1950   TOTAL KNEE ARTHROPLASTY Right 05-30-2006   TUBAL LIGATION     WISDOM TOOTH EXTRACTION      ROS- all systems are reviewed and negatives except as per HPI above  Current Outpatient Medications  Medication Sig Dispense Refill   alendronate (FOSAMAX) 70 MG tablet Take 70 mg by mouth once a week. Thursdays     apixaban (ELIQUIS) 5 MG TABS tablet Take 1 tablet by mouth in the morning and at bedtime.     cetirizine (ZYRTEC) 10 MG tablet Take 10 mg by mouth daily.     Cholecalciferol (VITAMIN D) 2000 units CAPS Take 1 capsule at bedtime by mouth.      diltiazem (CARDIZEM) 120 MG tablet TAKE ONE TABLET BY MOUTH DAILY 30 tablet 0   pravastatin (PRAVACHOL) 40 MG tablet Take 40 mg by mouth daily.     No current  facility-administered medications for this visit.    Physical Exam: Vitals:   03/05/22 1410  BP: 120/70  Pulse: 84  SpO2: 95%  Weight: 171 lb 9.6 oz (77.8 kg)  Height: '5\' 7"'$  (1.702 m)    GEN- The patient is well appearing, alert and oriented x 3 today.   Head- normocephalic, atraumatic Eyes-  Sclera clear, conjunctiva pink Ears- hearing intact Oropharynx- clear Lungs- Clear to ausculation bilaterally, normal work of breathing Heart- Regular rate and rhythm, no murmurs, rubs or gallops, PMI not laterally displaced GI- soft, NT, ND, + BS Extremities- no clubbing, cyanosis, or edema  Wt Readings from Last 3 Encounters:  03/05/22 171 lb 9.6 oz (77.8 kg)  12/12/21 172 lb (78 kg)  01/07/21 168 lb (76.2 kg)    EKG tracing ordered today is  personally reviewed and shows sinus  Assessment and Plan:  SVT Well controlled  2. Recurrent PTE Followed by PCP  On eliquis  3. HL Stable No change required today  4. Prior cryptogenic stroke On eliquis for PTE  Return to see EP APP in a year  Thompson Grayer MD, [facility] 03/05/2022 2:28 PM

## 2022-03-10 DIAGNOSIS — M542 Cervicalgia: Secondary | ICD-10-CM | POA: Diagnosis not present

## 2022-03-10 DIAGNOSIS — M25512 Pain in left shoulder: Secondary | ICD-10-CM | POA: Diagnosis not present

## 2022-03-10 DIAGNOSIS — M19012 Primary osteoarthritis, left shoulder: Secondary | ICD-10-CM | POA: Diagnosis not present

## 2022-04-01 DIAGNOSIS — Z419 Encounter for procedure for purposes other than remedying health state, unspecified: Secondary | ICD-10-CM | POA: Diagnosis not present

## 2022-04-01 DIAGNOSIS — L738 Other specified follicular disorders: Secondary | ICD-10-CM | POA: Diagnosis not present

## 2022-04-01 DIAGNOSIS — D1801 Hemangioma of skin and subcutaneous tissue: Secondary | ICD-10-CM | POA: Diagnosis not present

## 2022-04-01 DIAGNOSIS — D692 Other nonthrombocytopenic purpura: Secondary | ICD-10-CM | POA: Diagnosis not present

## 2022-04-01 DIAGNOSIS — L814 Other melanin hyperpigmentation: Secondary | ICD-10-CM | POA: Diagnosis not present

## 2022-04-01 DIAGNOSIS — Z85828 Personal history of other malignant neoplasm of skin: Secondary | ICD-10-CM | POA: Diagnosis not present

## 2022-04-01 DIAGNOSIS — L821 Other seborrheic keratosis: Secondary | ICD-10-CM | POA: Diagnosis not present

## 2022-04-01 DIAGNOSIS — D225 Melanocytic nevi of trunk: Secondary | ICD-10-CM | POA: Diagnosis not present

## 2022-04-01 DIAGNOSIS — L700 Acne vulgaris: Secondary | ICD-10-CM | POA: Diagnosis not present

## 2022-04-07 DIAGNOSIS — I513 Intracardiac thrombosis, not elsewhere classified: Secondary | ICD-10-CM | POA: Diagnosis not present

## 2022-04-07 DIAGNOSIS — M810 Age-related osteoporosis without current pathological fracture: Secondary | ICD-10-CM | POA: Diagnosis not present

## 2022-04-07 DIAGNOSIS — E785 Hyperlipidemia, unspecified: Secondary | ICD-10-CM | POA: Diagnosis not present

## 2022-04-07 DIAGNOSIS — R600 Localized edema: Secondary | ICD-10-CM | POA: Diagnosis not present

## 2022-04-07 DIAGNOSIS — D692 Other nonthrombocytopenic purpura: Secondary | ICD-10-CM | POA: Diagnosis not present

## 2022-04-07 DIAGNOSIS — D6859 Other primary thrombophilia: Secondary | ICD-10-CM | POA: Diagnosis not present

## 2022-04-07 DIAGNOSIS — Z7901 Long term (current) use of anticoagulants: Secondary | ICD-10-CM | POA: Diagnosis not present

## 2022-04-07 DIAGNOSIS — I8290 Acute embolism and thrombosis of unspecified vein: Secondary | ICD-10-CM | POA: Diagnosis not present

## 2022-04-07 DIAGNOSIS — M1990 Unspecified osteoarthritis, unspecified site: Secondary | ICD-10-CM | POA: Diagnosis not present

## 2022-04-07 DIAGNOSIS — R030 Elevated blood-pressure reading, without diagnosis of hypertension: Secondary | ICD-10-CM | POA: Diagnosis not present

## 2022-04-07 DIAGNOSIS — I2699 Other pulmonary embolism without acute cor pulmonale: Secondary | ICD-10-CM | POA: Diagnosis not present

## 2022-04-07 DIAGNOSIS — R002 Palpitations: Secondary | ICD-10-CM | POA: Diagnosis not present

## 2022-04-21 DIAGNOSIS — M25512 Pain in left shoulder: Secondary | ICD-10-CM | POA: Diagnosis not present

## 2022-04-21 DIAGNOSIS — M19012 Primary osteoarthritis, left shoulder: Secondary | ICD-10-CM | POA: Diagnosis not present

## 2022-06-03 DIAGNOSIS — R350 Frequency of micturition: Secondary | ICD-10-CM | POA: Diagnosis not present

## 2022-06-03 DIAGNOSIS — I2699 Other pulmonary embolism without acute cor pulmonale: Secondary | ICD-10-CM | POA: Diagnosis not present

## 2022-06-03 DIAGNOSIS — I8290 Acute embolism and thrombosis of unspecified vein: Secondary | ICD-10-CM | POA: Diagnosis not present

## 2022-06-03 DIAGNOSIS — N390 Urinary tract infection, site not specified: Secondary | ICD-10-CM | POA: Diagnosis not present

## 2022-06-03 DIAGNOSIS — R002 Palpitations: Secondary | ICD-10-CM | POA: Diagnosis not present

## 2022-06-03 DIAGNOSIS — H8110 Benign paroxysmal vertigo, unspecified ear: Secondary | ICD-10-CM | POA: Diagnosis not present

## 2022-06-03 DIAGNOSIS — E785 Hyperlipidemia, unspecified: Secondary | ICD-10-CM | POA: Diagnosis not present

## 2022-06-03 DIAGNOSIS — Z23 Encounter for immunization: Secondary | ICD-10-CM | POA: Diagnosis not present

## 2022-06-03 DIAGNOSIS — I6990 Unspecified sequelae of unspecified cerebrovascular disease: Secondary | ICD-10-CM | POA: Diagnosis not present

## 2022-06-21 DIAGNOSIS — Z23 Encounter for immunization: Secondary | ICD-10-CM | POA: Diagnosis not present

## 2022-06-23 ENCOUNTER — Ambulatory Visit: Payer: Medicare Other | Admitting: Neurology

## 2022-07-07 DIAGNOSIS — D2239 Melanocytic nevi of other parts of face: Secondary | ICD-10-CM | POA: Diagnosis not present

## 2022-07-07 DIAGNOSIS — L738 Other specified follicular disorders: Secondary | ICD-10-CM | POA: Diagnosis not present

## 2022-07-07 DIAGNOSIS — L820 Inflamed seborrheic keratosis: Secondary | ICD-10-CM | POA: Diagnosis not present

## 2022-09-22 ENCOUNTER — Other Ambulatory Visit: Payer: Self-pay | Admitting: Internal Medicine

## 2022-09-22 DIAGNOSIS — M1612 Unilateral primary osteoarthritis, left hip: Secondary | ICD-10-CM | POA: Diagnosis not present

## 2022-09-22 DIAGNOSIS — M7062 Trochanteric bursitis, left hip: Secondary | ICD-10-CM | POA: Diagnosis not present

## 2022-09-22 DIAGNOSIS — Z1231 Encounter for screening mammogram for malignant neoplasm of breast: Secondary | ICD-10-CM

## 2022-09-22 DIAGNOSIS — M25552 Pain in left hip: Secondary | ICD-10-CM | POA: Diagnosis not present

## 2022-10-03 DIAGNOSIS — E559 Vitamin D deficiency, unspecified: Secondary | ICD-10-CM | POA: Diagnosis not present

## 2022-10-03 DIAGNOSIS — R002 Palpitations: Secondary | ICD-10-CM | POA: Diagnosis not present

## 2022-10-03 DIAGNOSIS — R7989 Other specified abnormal findings of blood chemistry: Secondary | ICD-10-CM | POA: Diagnosis not present

## 2022-10-03 DIAGNOSIS — E785 Hyperlipidemia, unspecified: Secondary | ICD-10-CM | POA: Diagnosis not present

## 2022-10-08 DIAGNOSIS — Z1212 Encounter for screening for malignant neoplasm of rectum: Secondary | ICD-10-CM | POA: Diagnosis not present

## 2022-10-10 DIAGNOSIS — R002 Palpitations: Secondary | ICD-10-CM | POA: Diagnosis not present

## 2022-10-10 DIAGNOSIS — Z1389 Encounter for screening for other disorder: Secondary | ICD-10-CM | POA: Diagnosis not present

## 2022-10-10 DIAGNOSIS — Z1331 Encounter for screening for depression: Secondary | ICD-10-CM | POA: Diagnosis not present

## 2022-10-10 DIAGNOSIS — Z0000 Encounter for general adult medical examination without abnormal findings: Secondary | ICD-10-CM | POA: Diagnosis not present

## 2022-10-10 DIAGNOSIS — R351 Nocturia: Secondary | ICD-10-CM | POA: Diagnosis not present

## 2022-10-10 DIAGNOSIS — R413 Other amnesia: Secondary | ICD-10-CM | POA: Diagnosis not present

## 2022-10-10 DIAGNOSIS — Z23 Encounter for immunization: Secondary | ICD-10-CM | POA: Diagnosis not present

## 2022-10-10 DIAGNOSIS — M1990 Unspecified osteoarthritis, unspecified site: Secondary | ICD-10-CM | POA: Diagnosis not present

## 2022-10-10 DIAGNOSIS — D6859 Other primary thrombophilia: Secondary | ICD-10-CM | POA: Diagnosis not present

## 2022-10-10 DIAGNOSIS — I2699 Other pulmonary embolism without acute cor pulmonale: Secondary | ICD-10-CM | POA: Diagnosis not present

## 2022-10-10 DIAGNOSIS — Z7901 Long term (current) use of anticoagulants: Secondary | ICD-10-CM | POA: Diagnosis not present

## 2022-10-10 DIAGNOSIS — M810 Age-related osteoporosis without current pathological fracture: Secondary | ICD-10-CM | POA: Diagnosis not present

## 2022-10-10 DIAGNOSIS — I8290 Acute embolism and thrombosis of unspecified vein: Secondary | ICD-10-CM | POA: Diagnosis not present

## 2022-10-10 DIAGNOSIS — D692 Other nonthrombocytopenic purpura: Secondary | ICD-10-CM | POA: Diagnosis not present

## 2022-10-10 DIAGNOSIS — K760 Fatty (change of) liver, not elsewhere classified: Secondary | ICD-10-CM | POA: Diagnosis not present

## 2022-10-10 DIAGNOSIS — R82998 Other abnormal findings in urine: Secondary | ICD-10-CM | POA: Diagnosis not present

## 2022-10-15 DIAGNOSIS — M25552 Pain in left hip: Secondary | ICD-10-CM | POA: Diagnosis not present

## 2022-10-27 DIAGNOSIS — M1612 Unilateral primary osteoarthritis, left hip: Secondary | ICD-10-CM | POA: Diagnosis not present

## 2022-10-29 ENCOUNTER — Telehealth: Payer: Self-pay | Admitting: *Deleted

## 2022-10-29 NOTE — Telephone Encounter (Signed)
I s/w the pt and she has been scheduled for tele pre op appt 11/04/22 @ 9:40. Med rec and consent are done.

## 2022-10-29 NOTE — Telephone Encounter (Signed)
I s/w the pt and she has been scheduled for tele pre op appt 11/04/22 @ 9:40. Med rec and consent are done.      Patient Consent for Virtual Visit        Sarah Valdez has provided verbal consent on 10/29/2022 for a virtual visit (video or telephone).   CONSENT FOR VIRTUAL VISIT FOR:  Sarah Valdez  By participating in this virtual visit I agree to the following:  I hereby voluntarily request, consent and authorize [facility] and its employed or contracted physicians, physician assistants, nurse practitioners or other licensed health care professionals (the Practitioner), to provide me with telemedicine health care services (the "Services") as deemed necessary by the treating Practitioner. I acknowledge and consent to receive the Services by the Practitioner via telemedicine. I understand that the telemedicine visit will involve communicating with the Practitioner through live audiovisual communication technology and the disclosure of certain medical information by electronic transmission. I acknowledge that I have been given the opportunity to request an in-person assessment or other available alternative prior to the telemedicine visit and am voluntarily participating in the telemedicine visit.  I understand that I have the right to withhold or withdraw my consent to the use of telemedicine in the course of my care at any time, without affecting my right to future care or treatment, and that the Practitioner or I may terminate the telemedicine visit at any time. I understand that I have the right to inspect all information obtained and/or recorded in the course of the telemedicine visit and may receive copies of available information for a reasonable fee.  I understand that some of the potential risks of receiving the Services via telemedicine include:  Delay or interruption in medical evaluation due to technological equipment failure or disruption; Information transmitted may not be  sufficient (e.g. poor resolution of images) to allow for appropriate medical decision making by the Practitioner; and/or  In rare instances, security protocols could fail, causing a breach of personal health information.  Furthermore, I acknowledge that it is my responsibility to provide information about my medical history, conditions and care that is complete and accurate to the best of my ability. I acknowledge that Practitioner's advice, recommendations, and/or decision may be based on factors not within their control, such as incomplete or inaccurate data provided by me or distortions of diagnostic images or specimens that may result from electronic transmissions. I understand that the practice of medicine is not an exact science and that Practitioner makes no warranties or guarantees regarding treatment outcomes. I acknowledge that a copy of this consent can be made available to me via my patient portal ([place]), or I can request a printed copy by calling the office of [facility].    I understand that my insurance will be billed for this visit.   I have read or had this consent read to me. I understand the contents of this consent, which adequately explains the benefits and risks of the Services being provided via telemedicine.  I have been provided ample opportunity to ask questions regarding this consent and the Services and have had my questions answered to my satisfaction. I give my informed consent for the services to be provided through the use of telemedicine in my medical care

## 2022-10-29 NOTE — Telephone Encounter (Signed)
   Pre-operative Risk Assessment    Patient Name: Sarah Valdez  DOB: March 11, 1946 MRN: [id number]      Request for Surgical Clearance    Procedure:   LEFT TOTAL HIP ARTHROPLASTY  Date of Surgery:  Clearance 12/02/22                                 Surgeon:  DR. MATTHEW OLIN Surgeon's Group or Practice Name:  [facility] Phone number:  [phone] ATTN: Orson Slick Fax number:  [phone]   Type of Clearance Requested:   - Medical  - Pharmacy:  Hold Apixaban (Eliquis)     Type of Anesthesia:  Spinal   Additional requests/questions:    [name]   10/29/2022, 11:14 AM

## 2022-10-29 NOTE — Telephone Encounter (Signed)
   Name: Sarah Valdez  DOB: 1945/09/02  MRN: [id number]  Primary Cardiologist: None   Preoperative team, please contact this patient and set up a phone call appointment for further preoperative risk assessment. Please obtain consent and complete medication review. Thank you for your help.  I confirm that guidance regarding antiplatelet and oral anticoagulation therapy has been completed and, if necessary, noted below.  Pt takes Eliquis for prior PE, we have not been managing pt for this or prescribing her Eliquis. Recommend clearance come from managing provider.    Deberah Pelton, NP 10/29/2022, 4:37 PM [place]

## 2022-10-29 NOTE — Telephone Encounter (Signed)
Pt takes Eliquis for prior PE, we have not been managing pt for this or prescribing her Eliquis. Recommend clearance come from managing provider.

## 2022-11-03 NOTE — Progress Notes (Unsigned)
Virtual Visit via Telephone Note   Because of Sarah Valdez's co-morbid illnesses, she is at least at moderate risk for complications without adequate follow up.  This format is felt to be most appropriate for this patient at this time.  The patient did not have access to video technology/had technical difficulties with video requiring transitioning to audio format only (telephone).  All issues noted in this document were discussed and addressed.  No physical exam could be performed with this format.  Please refer to the patient's chart for her consent to telehealth for [facility].  Evaluation Performed:  Preoperative cardiovascular risk assessment _____________   Date:  11/03/2022   Patient ID:  Sarah Valdez, DOB 09-21-1945, MRN [id number] Patient Location:  Home Provider location:   Office  Primary Care Provider:  Shon Baton, MD Primary Cardiologist:  None  Chief Complaint / Patient Profile   77 y.o. y/o female with a h/o SVT, cryptogenic stroke (s/p ILR), HLD, HTN, bilateral PE (on Eliquis), DVT who is pending left total hip arthroplasty and presents today for telephonic preoperative cardiovascular risk assessment.  History of Present Illness    Sarah Valdez is a 77 y.o. female who presents via audio/video conferencing for a telehealth visit today.  Pt was last seen in cardiology clinic on 03/05/2022 by Sarah Valdez.  At that time Sarah Valdez was doing well with no new cardiac complaints.  The patient is now pending procedure as outlined above. Since her last visit, she was doing well with no new cardiac complaints at this time.  She denies chest pain, shortness of breath, lower extremity edema, fatigue, palpitations, melena, hematuria, hemoptysis, diaphoresis, weakness, presyncope, syncope, orthopnea, and PND.    Past Medical History    Past Medical History:  Diagnosis Date   Allergy    Bilateral sensorineural hearing loss 11/06/2016   Cancer ([place])    skin  cancer - on back, forehead   Chronic congestive heart failure ([place]) 02/22/2018   Cryptogenic stroke ([place])    followed by Dr Sarah Valdez   Diverticulosis of colon 2007   Dr Sarah Valdez   Gallstones 01/20/2014   12/2013 multiple gallstones, largest 1.7 cm in diameter; asymptomatic    History of basal cell carcinoma excision    X3   History of colon polyps    NON-CANCEROUS   History of kidney stones    Hyperlipidemia    OA (osteoarthritis of spine)    Palpitations 11/24/2007   Qualifier: Diagnosis of  By: Sarah Darner MD, Sarah     Pancreatic cyst 01/20/2014   01/03/14 8 x 11 mm nonaggressive cystic lesion the pancreas. Followup recommended in one year.    PONV (postoperative nausea and vomiting)    PSVT (paroxysmal supraventricular tachycardia) (HCC)    adenosine sensitive SVT previously documented   Pulmonary embolism ([place])    Right ureteral stone    Urgency of urination    Wears glasses    Past Surgical History:  Procedure Laterality Date   APPENDECTOMY  1950   CARPECTOMY WITH RADIAL STYLOIDECTOMY Right 09-12-2010   AND NEURECTOMY  OF WRIST (STAGE III KIENBOCK DISEASE)   CLOSED MANIPULATION POST TOTAL  KNEE ARTHROPLASTY  07-13-2006   COLONOSCOPY W/ POLYPECTOMY  2002     adenomatous; Dr Sarah Valdez   epidural steroid injections     two rounds of injections; lumbar spine    EXTRACORPOREAL SHOCK WAVE LITHOTRIPSY  X3   FACELIFT W/BLEPHAROPLASTY  2005   HYSTEROSCOPY WITH D & C  12-22-2000   implantable loop recorder removal  10/29/2020   MDT Reveal LINQ removed by Dr Sarah Valdez   LOOP RECORDER INSERTION N/A 05/21/2017   Procedure: LOOP RECORDER INSERTION;  Surgeon: Sarah Grayer, MD;  Location: [facility];  Service: Cardiovascular;  Laterality: N/A;   OPEN KNEE MENISECTOMY  1964   RIGHT URETEROSCOPIC STONE EXTRACTION / STENT PLACEMENT  08-21-2008   rotator cuff on right shoulder     TONSILLECTOMY  1950   TOTAL KNEE ARTHROPLASTY Right 05-30-2006   TUBAL LIGATION     WISDOM TOOTH EXTRACTION       Allergies  Allergies  Allergen Reactions   Morphine Other (See Comments)    Respiratory compromise post op   Penicillins Rash    Has patient had a PCN reaction causing immediate rash, facial/tongue/throat swelling, SOB or lightheadedness with hypotension: No Has patient had a PCN reaction causing severe rash involving mucus membranes or skin necrosis: No Has patient had a PCN reaction that required hospitalization: No Has patient had a PCN reaction occurring within the last 10 years: No If all of the above answers are "NO", then may proceed with Cephalosporin use. urticaria   Levofloxacin Other (See Comments)     SHOULDER PAIN    Home Medications    Prior to Admission medications   Medication Sig Start Date End Date Taking? Authorizing Provider  alendronate (FOSAMAX) 70 MG tablet Take 70 mg by mouth once a week. Thursdays 01/19/17   [provider]  apixaban (ELIQUIS) 5 MG TABS tablet Take 1 tablet by mouth in the morning and at bedtime.    [provider]  cetirizine (ZYRTEC) 10 MG tablet Take 10 mg by mouth daily.    [provider]  Cholecalciferol (VITAMIN D) 2000 units CAPS Take 1 capsule at bedtime by mouth.     [provider]  diltiazem (CARDIZEM) 120 MG tablet TAKE ONE TABLET BY MOUTH DAILY 12/10/21   Valdez, Sarah Rinks, MD  pravastatin (PRAVACHOL) 40 MG tablet Take 40 mg by mouth daily.    [provider]    Physical Exam    Vital Signs:  Sarah Valdez does not have vital signs available for review today.  Given telephonic nature of communication, physical exam is limited. AAOx3. NAD. Normal affect.  Speech and respirations are unlabored.  Accessory Clinical Findings    None  Assessment & Plan    1.  Preoperative Cardiovascular Risk Assessment:  Sarah Valdez perioperative risk of a major cardiac event is 0.9% according to the Revised Cardiac Risk Index (RCRI).  Therefore, she is at low risk for perioperative  complications.   Her functional capacity is fair at 4.3 METs according to the Duke Activity Status Index (DASI). Recommendations: According to ACC/AHA guidelines, no further cardiovascular testing needed.  The patient may proceed to surgery at acceptable risk.   Antiplatelet and/or Anticoagulation Recommendations:   Recommendations for Eliquis should come from prescribing MD.  Patient is on Eliquis due to prior PE.   The patient was advised that if she develops new symptoms prior to surgery to contact our office to arrange for a follow-up visit, and she verbalized understanding.    A copy of this note will be routed to requesting surgeon.  Time:   Today, I have spent 6 minutes with the patient with telehealth technology discussing medical history, symptoms, and management plan.     Mable Fill, Marissa Nestle, NP  11/03/2022, 12:46 PM

## 2022-11-04 ENCOUNTER — Ambulatory Visit: Payer: Medicare Other | Attending: Cardiovascular Disease

## 2022-11-04 DIAGNOSIS — Z01810 Encounter for preprocedural cardiovascular examination: Secondary | ICD-10-CM

## 2022-11-11 ENCOUNTER — Ambulatory Visit
Admission: RE | Admit: 2022-11-11 | Discharge: 2022-11-11 | Disposition: A | Discharge status: Home / Self Care | Payer: Medicare Other | Source: Ambulatory Visit | Attending: Internal Medicine | Admitting: Internal Medicine

## 2022-11-11 DIAGNOSIS — Z1231 Encounter for screening mammogram for malignant neoplasm of breast: Secondary | ICD-10-CM

## 2022-11-18 NOTE — Patient Instructions (Signed)
DUE TO COVID-19 ONLY TWO VISITORS  (aged 79 and older)  ARE ALLOWED TO COME WITH YOU AND STAY IN THE WAITING ROOM ONLY DURING PRE OP AND PROCEDURE.   **NO VISITORS ARE ALLOWED IN THE SHORT STAY AREA OR RECOVERY ROOM!!**  IF YOU WILL BE ADMITTED INTO THE HOSPITAL YOU ARE ALLOWED ONLY FOUR SUPPORT PEOPLE DURING VISITATION HOURS ONLY (7 AM -8PM)   The support person(s) must pass our screening, gel in and out, and wear a mask at all times, including in the patient's room. Patients must also wear a mask when staff or their support person are in the room. Visitors GUEST BADGE MUST BE WORN VISIBLY  One adult visitor may remain with you overnight and MUST be in the room by 8 P.M.     Your procedure is scheduled on: 12/02/22   Report to [facility] Main Entrance    Report to admitting at : 9:00 AM   Call this number if you have problems the morning of surgery [phone]   Do not eat food :After Midnight.   After Midnight you may have the following liquids until : 8:30 AM DAY OF SURGERY  Water Black Coffee (sugar ok, NO MILK/CREAM OR CREAMERS)  Tea (sugar ok, NO MILK/CREAM OR CREAMERS) regular and decaf                             Plain Jell-O (NO RED)                                           Fruit ices (not with fruit pulp, NO RED)                                     Popsicles (NO RED)                                                                  Juice: apple, WHITE grape, WHITE cranberry Sports drinks like Gatorade (NO RED)   The day of surgery:  Drink ONE (1) Pre-Surgery Clear Ensure at : 8:30 AM the morning of surgery. Drink in one sitting. Do not sip.  This drink was given to you during your hospital  pre-op appointment visit. Nothing else to drink after completing the  Pre-Surgery Clear Ensure or G2.          If you have questions, please contact your surgeon's office   Oral Hygiene is also important to reduce your risk of infection.                                     Remember - BRUSH YOUR TEETH THE MORNING OF SURGERY WITH YOUR REGULAR TOOTHPASTE  DENTURES WILL BE REMOVED PRIOR TO SURGERY PLEASE DO NOT APPLY "Poly grip" OR ADHESIVES!!!   Do NOT smoke after Midnight   Take these medicines the morning of surgery with A SIP OF WATER:cetirizine,diltiazem.  You may not have any metal on your body including hair pins, jewelry, and body piercing             Do not wear make-up, lotions, powders, perfumes/cologne, or deodorant  Do not wear nail polish including gel and S&S, artificial/acrylic nails, or any other type of covering on natural nails including finger and toenails. If you have artificial nails, gel coating, etc. that needs to be removed by a nail salon please have this removed prior to surgery or surgery may need to be canceled/ delayed if the surgeon/ anesthesia feels like they are unable to be safely monitored.   Do not shave  48 hours prior to surgery.   Do not bring valuables to the hospital. [place].   Contacts, glasses, or bridgework may not be worn into surgery.   Bring small overnight bag day of surgery.   DO NOT [place]. PHARMACY WILL DISPENSE MEDICATIONS LISTED ON YOUR MEDICATION LIST TO YOU DURING YOUR ADMISSION [place]!    Patients discharged on the day of surgery will not be allowed to drive home.  Someone NEEDS to stay with you for the first 24 hours after anesthesia.   Special Instructions: Bring a copy of your healthcare power of attorney and living will documents         the day of surgery if you haven't scanned them before.              Please read over the following fact sheets you were given: IF YOU HAVE QUESTIONS ABOUT YOUR PRE-OP INSTRUCTIONS PLEASE CALL [phone]    [facility] - Preparing for Surgery Before surgery, you can play an important role.  Because skin is not sterile, your skin needs to be as free of  germs as possible.  You can reduce the number of germs on your skin by washing with CHG (chlorahexidine gluconate) soap before surgery.  CHG is an antiseptic cleaner which kills germs and bonds with the skin to continue killing germs even after washing. Please DO NOT use if you have an allergy to CHG or antibacterial soaps.  If your skin becomes reddened/irritated stop using the CHG and inform your nurse when you arrive at Short Stay. Do not shave (including legs and underarms) for at least 48 hours prior to the first CHG shower.  You may shave your face/neck. Please follow these instructions carefully:  1.  Shower with CHG Soap the night before surgery and the  morning of Surgery.  2.  If you choose to wash your hair, wash your hair first as usual with your  normal  shampoo.  3.  After you shampoo, rinse your hair and body thoroughly to remove the  shampoo.                           4.  Use CHG as you would any other liquid soap.  You can apply chg directly  to the skin and wash                       Gently with a scrungie or clean washcloth.  5.  Apply the CHG Soap to your body ONLY FROM THE NECK DOWN.   Do not use on face/ open  Wound or open sores. Avoid contact with eyes, ears mouth and genitals (private parts).                       Wash face,  Genitals (private parts) with your normal soap.             6.  Wash thoroughly, paying special attention to the area where your surgery  will be performed.  7.  Thoroughly rinse your body with warm water from the neck down.  8.  DO NOT shower/wash with your normal soap after using and rinsing off  the CHG Soap.                9.  Pat yourself dry with a clean towel.            10.  Wear clean pajamas.            11.  Place clean sheets on your bed the night of your first shower and do not  sleep with pets. Day of Surgery : Do not apply any lotions/deodorants the morning of surgery.  Please wear clean clothes to the  hospital/surgery center.  FAILURE TO FOLLOW THESE INSTRUCTIONS MAY RESULT IN THE CANCELLATION OF YOUR SURGERY PATIENT SIGNATURE_________________________________  NURSE SIGNATURE__________________________________  ________________________________________________________________________  Sarah Valdez  An incentive spirometer is a tool that can help keep your lungs clear and active. This tool measures how well you are filling your lungs with each breath. Taking long deep breaths may help reverse or decrease the chance of developing breathing (pulmonary) problems (especially infection) following: A long period of time when you are unable to move or be active. BEFORE THE PROCEDURE  If the spirometer includes an indicator to show your best effort, your nurse or respiratory therapist will set it to a desired goal. If possible, sit up straight or lean slightly forward. Try not to slouch. Hold the incentive spirometer in an upright position. INSTRUCTIONS FOR USE  Sit on the edge of your bed if possible, or sit up as far as you can in bed or on a chair. Hold the incentive spirometer in an upright position. Breathe out normally. Place the mouthpiece in your mouth and seal your lips tightly around it. Breathe in slowly and as deeply as possible, raising the piston or the ball toward the top of the column. Hold your breath for 3-5 seconds or for as long as possible. Allow the piston or ball to fall to the bottom of the column. Remove the mouthpiece from your mouth and breathe out normally. Rest for a few seconds and repeat Steps 1 through 7 at least 10 times every 1-2 hours when you are awake. Take your time and take a few normal breaths between deep breaths. The spirometer may include an indicator to show your best effort. Use the indicator as a goal to work toward during each repetition. After each set of 10 deep breaths, practice coughing to be sure your lungs are clear. If you have an  incision (the cut made at the time of surgery), support your incision when coughing by placing a pillow or rolled up towels firmly against it. Once you are able to get out of bed, walk around indoors and cough well. You may stop using the incentive spirometer when instructed by your caregiver.  RISKS AND COMPLICATIONS Take your time so you do not get dizzy or light-headed. If you are in pain, you may need to take or ask  for pain medication before doing incentive spirometry. It is harder to take a deep breath if you are having pain. AFTER USE Rest and breathe slowly and easily. It can be helpful to keep track of a log of your progress. Your caregiver can provide you with a simple table to help with this. If you are using the spirometer at home, follow these instructions: [place] IF:  You are having difficultly using the spirometer. You have trouble using the spirometer as often as instructed. Your pain medication is not giving enough relief while using the spirometer. You develop fever of 100.5 F (38.1 C) or higher. SEEK IMMEDIATE MEDICAL CARE IF:  You cough up bloody sputum that had not been present before. You develop fever of 102 F (38.9 C) or greater. You develop worsening pain at or near the incision site. MAKE SURE YOU:  Understand these instructions. Will watch your condition. Will get help right away if you are not doing well or get worse. Document Released: 12/22/2006 Document Revised: 11/03/2011 Document Reviewed: 02/22/2007 [facility] Patient Information 2014 [place], [place].   ________________________________________________________________________

## 2022-11-19 ENCOUNTER — Encounter (HOSPITAL_COMMUNITY): Payer: Self-pay

## 2022-11-19 ENCOUNTER — Other Ambulatory Visit: Payer: Self-pay

## 2022-11-19 ENCOUNTER — Encounter (HOSPITAL_COMMUNITY)
Admission: RE | Admit: 2022-11-19 | Discharge: 2022-11-19 | Disposition: A | Discharge status: Home / Self Care | Payer: Medicare Other | Source: Ambulatory Visit | Attending: Orthopedic Surgery | Admitting: Orthopedic Surgery

## 2022-11-19 VITALS — BP 129/76 | HR 65 | Temp 98.1°F | Ht 67.0 in | Wt 173.0 lb

## 2022-11-19 DIAGNOSIS — I10 Essential (primary) hypertension: Secondary | ICD-10-CM

## 2022-11-19 DIAGNOSIS — I110 Hypertensive heart disease with heart failure: Secondary | ICD-10-CM | POA: Diagnosis not present

## 2022-11-19 DIAGNOSIS — Z01818 Encounter for other preprocedural examination: Secondary | ICD-10-CM

## 2022-11-19 DIAGNOSIS — I509 Heart failure, unspecified: Secondary | ICD-10-CM | POA: Diagnosis not present

## 2022-11-19 DIAGNOSIS — M1612 Unilateral primary osteoarthritis, left hip: Secondary | ICD-10-CM | POA: Insufficient documentation

## 2022-11-19 DIAGNOSIS — Z01812 Encounter for preprocedural laboratory examination: Secondary | ICD-10-CM | POA: Diagnosis not present

## 2022-11-19 DIAGNOSIS — Z8673 Personal history of transient ischemic attack (TIA), and cerebral infarction without residual deficits: Secondary | ICD-10-CM | POA: Insufficient documentation

## 2022-11-19 DIAGNOSIS — Z86711 Personal history of pulmonary embolism: Secondary | ICD-10-CM | POA: Insufficient documentation

## 2022-11-19 HISTORY — DX: Cardiac arrhythmia, unspecified: I49.9

## 2022-11-19 LAB — CBC
HCT: 44.5 % (ref 36.0–46.0)
Hemoglobin: 14.5 g/dL (ref 12.0–15.0)
MCH: 32.1 pg (ref 26.0–34.0)
MCHC: 32.6 g/dL (ref 30.0–36.0)
MCV: 98.5 fL (ref 80.0–100.0)
Platelets: 238 10*3/uL (ref 150–400)
RBC: 4.52 MIL/uL (ref 3.87–5.11)
RDW: 15.9 % — ABNORMAL HIGH (ref 11.5–15.5)
WBC: 11.1 10*3/uL — ABNORMAL HIGH (ref 4.0–10.5)
nRBC: 0 % (ref 0.0–0.2)

## 2022-11-19 LAB — BASIC METABOLIC PANEL
Anion gap: 9 (ref 5–15)
BUN: 33 mg/dL — ABNORMAL HIGH (ref 8–23)
CO2: 24 mmol/L (ref 22–32)
Calcium: 9.7 mg/dL (ref 8.9–10.3)
Chloride: 106 mmol/L (ref 98–111)
Creatinine, Ser: 0.94 mg/dL (ref 0.44–1.00)
GFR, Estimated: >60 mL/min (ref >60)
Glucose, Bld: 96 mg/dL (ref 70–99)
Potassium: 4.2 mmol/L (ref 3.5–5.1)
Sodium: 139 mmol/L (ref 135–145)

## 2022-11-19 LAB — SURGICAL PCR SCREEN
MRSA, PCR: NEGATIVE
Staphylococcus aureus: NEGATIVE

## 2022-11-19 LAB — TYPE AND SCREEN
ABO/RH(D): A POS
Antibody Screen: NEGATIVE

## 2022-11-19 NOTE — Progress Notes (Addendum)
For Short Stay: [place] appointment date:  Bowel Prep reminder:   For Anesthesia: PCP - Dr. Shon Baton Cardiologist - N/A Clearance: Linton Rump: NP: 11/04/22 Chest x-ray -  EKG - 03/05/22 Stress Test -  ECHO - 02/07/21 Cardiac Cath -  Pacemaker/ICD device last checked: Pacemaker orders received: Device Rep notified:  Spinal Cord Stimulator:  Sleep Study - N/A CPAP -   Fasting Blood Sugar - N/A Checks Blood Sugar _____ times a day Date and result of last Hgb A1c-  Last dose of GLP1 agonist- N/A GLP1 instructions:   Last dose of SGLT-2 inhibitors-  SGLT-2 instructions:   Blood Thinner Instructions: Eliquis will be held 3 days before surgery: Dr. Virgina Jock. Aspirin Instructions: Last Dose:  Activity level: Can go up a flight of stairs and activities of daily living without stopping and without chest pain and/or shortness of breath (using a walker)    Anesthesia review: Hx: CHF,PE,Palpitations.  Patient denies shortness of breath, fever, cough and chest pain at PAT appointment   Patient verbalized understanding of instructions that were given to them at the PAT appointment. Patient was also instructed that they will need to review over the PAT instructions again at home before surgery.

## 2022-11-20 NOTE — Progress Notes (Signed)
Anesthesia Chart Review:   Case: [id number] Date/Time: 12/02/22 1115   Procedure: TOTAL HIP ARTHROPLASTY ANTERIOR APPROACH (Left: Hip)   Anesthesia type: Spinal   Pre-op diagnosis: Left hip osteoarthritis   Location: [facility] ROOM 10 / [facility]   Surgeons: Paralee Cancel, MD       DISCUSSION: Pt is 77 years old with hx PSVT, cryptogenic stroke (loop recorder insertion was explanted 10/29/20), CHF,  bilateral PE (2022), R DVT (2022)  Pt to hold eliquis 3 days before surgery  VS: BP 129/76   Pulse 65   Temp 36.7 C (Oral)   Ht 5\' 7"  (1.702 m)   Wt 78.5 kg   SpO2 95%   BMI 27.10 kg/m   PROVIDERS: - PCP is Shon Baton, MD - EP cardiologist is Thompson Grayer, MD. Last office visit 03/05/22. Cleared for surgery at acceptable risk on 11/04/22 by Ambrose Pancoast, NP   LABS: Labs reviewed: Acceptable for surgery. (all labs ordered are listed, but only abnormal results are displayed)  Labs Reviewed  BASIC METABOLIC PANEL - Abnormal; Notable for the following components:      Result Value   BUN 33 (*)    All other components within normal limits  CBC - Abnormal; Notable for the following components:   WBC 11.1 (*)    RDW 15.9 (*)    All other components within normal limits  SURGICAL PCR SCREEN  TYPE AND SCREEN     IMAGES: CTA chest 10/11/20 1. Extensive bilateral pulmonary emboli with evidence of right heart strain (RV/LV Ratio 1.4) consistent with at least submassive (intermediate risk) PE. The presence of right heart strain has been associated with an increased risk of morbidity and mortality.  2. Small wedge-shaped opacity in the periphery of the left upper lobe anteriorly may reflect an area of pulmonary infarction. 3. Suspect cholelithiasis.    EKG 03/05/22: sinus rhythm   CV: Echo 02/07/21:  1. Left ventricular ejection fraction, by estimation, is 55 to 60%. The left ventricle has normal function. The left ventricle has no regional wall motion abnormalities. Left ventricular  diastolic parameters are consistent with Grade I diastolic dysfunction (impaired relaxation). The average left ventricular global longitudinal strain is -17.5 %. The global longitudinal strain is normal.  2. Right ventricular systolic function is normal. The right ventricular size is normal. Tricuspid regurgitation signal is inadequate for assessing PA pressure.  3. The mitral valve is normal in structure. No evidence of mitral valve regurgitation. No evidence of mitral stenosis.  4. The aortic valve is tricuspid. Aortic valve regurgitation is not visualized. No aortic stenosis is present.  5. The inferior vena cava is normal in size with greater than 50% respiratory variability, suggesting right atrial pressure of 3 mmHg.   B lower extremity US 01/14/21:  RIGHT:  - Findings consistent with chronic deep vein thrombosis involving the right gastrocnemius veins.  - There is no evidence of superficial venous thrombosis.  - No cystic structure found in the popliteal fossa.  LEFT:  - No evidence of deep vein thrombosis in the lower extremity. No indirect evidence of obstruction proximal to the inguinal ligament.      Past Medical History:  Diagnosis Date   Allergy    Bilateral sensorineural hearing loss 11/06/2016   Cancer (HCC)    skin cancer - on back, forehead   Chronic congestive heart failure ([place]) 02/22/2018   Cryptogenic stroke [facility])    followed by Dr Jaynee Eagles   Diverticulosis of colon 2007   Dr  Brodie   Dysrhythmia    Gallstones 01/20/2014   12/2013 multiple gallstones, largest 1.7 cm in diameter; asymptomatic    History of basal cell carcinoma excision    X3   History of colon polyps    NON-CANCEROUS   History of kidney stones    Hyperlipidemia    OA (osteoarthritis of spine)    Palpitations 11/24/2007   Qualifier: Diagnosis of  By: Linna Darner MD, William     Pancreatic cyst 01/20/2014   01/03/14 8 x 11 mm nonaggressive cystic lesion the pancreas. Followup recommended in one year.     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)   CLOSED MANIPULATION POST TOTAL  KNEE ARTHROPLASTY  07/13/2006   COLONOSCOPY W/ POLYPECTOMY  2002   adenomatous; Dr Olevia Perches   epidural steroid injections     two rounds of injections; lumbar spine    EXTRACORPOREAL SHOCK WAVE LITHOTRIPSY  X3   FACELIFT W/BLEPHAROPLASTY  2005   HYSTEROSCOPY WITH D & C  12/22/2000   implantable loop recorder removal  10/29/2020   MDT Reveal LINQ removed by Dr Rayann Heman   LOOP RECORDER INSERTION N/A 05/21/2017   Procedure: LOOP RECORDER INSERTION;  Surgeon: Thompson Grayer, MD;  Location: [place] [facility];  Service: Cardiovascular;  Laterality: N/A;   OPEN KNEE MENISECTOMY  1968   RIGHT URETEROSCOPIC STONE EXTRACTION / STENT PLACEMENT  08/21/2008   rotator cuff on right shoulder     TONSILLECTOMY  1950   TOTAL KNEE ARTHROPLASTY Right 05/30/2006   TUBAL LIGATION     WISDOM TOOTH EXTRACTION      MEDICATIONS:  traMADol (ULTRAM) 50 MG tablet   acetaminophen (TYLENOL) 650 MG CR tablet   alendronate (FOSAMAX) 70 MG tablet   apixaban (ELIQUIS) 5 MG TABS tablet   Calcium Carbonate Antacid (TUMS PO)   Carboxymethylcellulose Sodium (EYE DROPS OP)   Cholecalciferol (VITAMIN D) 125 MCG (5000 UT) CAPS   diltiazem (CARDIZEM) 120 MG tablet   diphenhydramine-acetaminophen (TYLENOL PM) 25-500 MG TABS tablet   fluticasone (FLONASE) 50 MCG/ACT nasal spray   pravastatin (PRAVACHOL) 40 MG tablet   predniSONE (DELTASONE) 5 MG tablet   No current facility-administered medications for this encounter.    If no changes, I anticipate pt can proceed with  surgery as scheduled.   Willeen Cass, PhD, FNP-BC [facility] Short Stay Surgical Center/Anesthesiology Phone: [phone] 11/20/2022 1:45 PM

## 2022-11-20 NOTE — Anesthesia Preprocedure Evaluation (Addendum)
Anesthesia Evaluation  Patient identified by MRN, date of birth, ID band Patient awake    Reviewed: Allergy & Precautions, NPO status , Patient's Chart, lab work & pertinent test results  History of Anesthesia Complications (+) PONV and history of anesthetic complications  Airway Mallampati: II  TM Distance: >3 FB Neck ROM: Full    Dental no notable dental hx.    Pulmonary asthma    Pulmonary exam normal        Cardiovascular +CHF  Normal cardiovascular exam+ dysrhythmias      Neuro/Psych CVA, No Residual Symptoms    GI/Hepatic negative GI ROS, Neg liver ROS,,,  Endo/Other  negative endocrine ROS    Renal/GU negative Renal ROS     Musculoskeletal  (+) Arthritis ,    Abdominal   Peds  Hematology  (+) Blood dyscrasia (Eliquis)   Anesthesia Other Findings Left hip osteoarthritis  Reproductive/Obstetrics                             Anesthesia Physical Anesthesia Plan  ASA: 3  Anesthesia Plan: Spinal   Post-op Pain Management:    Induction: Intravenous  PONV Risk Score and Plan: 3 and Ondansetron, Dexamethasone, Propofol infusion and Treatment may vary due to age or medical condition  Airway Management Planned: Simple Face Mask  Additional Equipment:   Intra-op Plan:   Post-operative Plan:   Informed Consent: I have reviewed the patients History and Physical, chart, labs and discussed the procedure including the risks, benefits and alternatives for the proposed anesthesia with the patient or authorized representative who has indicated his/her understanding and acceptance.     Dental advisory given  Plan Discussed with: CRNA  Anesthesia Plan Comments: (APP note by Joslyn Hy, FNP )       Anesthesia Quick Evaluation

## 2022-11-27 NOTE — H&P (Signed)
TOTAL HIP ADMISSION H&P  Patient is admitted for left total hip arthroplasty.  Subjective:  Chief Complaint: left hip pain  HPI: Sarah Valdez, 77 y.o. female, has a history of pain and functional disability in the left hip(s) due to arthritis and patient has failed non-surgical conservative treatments for greater than 12 weeks to include NSAID's and/or analgesics and activity modification.  Onset of symptoms was gradual starting 2 years ago with gradually worsening course since that time.The patient noted no past surgery on the left hip(s).  Patient currently rates pain in the left hip at 8 out of 10 with activity. Patient has worsening of pain with activity and weight bearing, pain that interfers with activities of daily living, and pain with passive range of motion. Patient has evidence of joint space narrowing by imaging studies. This condition presents safety issues increasing the risk of falls. There is no current active infection.  Patient Active Problem List   Diagnosis Date Noted   Supraventricular tachycardia 03/05/2022   Bilateral pulmonary embolism 10/11/2020   Essential hypertension 10/11/2020   Right ureteral stone    Chronic congestive heart failure 02/22/2018   Cryptogenic stroke 12/27/2016   Bilateral sensorineural hearing loss 11/06/2016   Imbalance 11/06/2016   Hepatic steatosis 01/20/2014   Gallstones 01/20/2014   Pancreatic cyst 01/20/2014   Abnormal CT scan, liver 01/02/2014   Back spasm 06/10/2013   Osteopenia 09/20/2012   COUGH VARIANT ASTHMA 02/11/2010   Diverticulosis of large intestine 12/25/2009   Wrist osteonecrosis 12/25/2009   Vitamin D deficiency 11/28/2008   NEPHROLITHIASIS, HX OF 11/28/2008   Dyslipidemia 11/24/2007   Allergic rhinitis, cause unspecified 11/24/2007   PALPITATIONS 11/24/2007   DEGENERATIVE [place] DISEASE 01/19/2007   SKIN CANCER, HX OF 01/19/2007   COLONIC POLYPS, HX OF 01/19/2007   Past Medical History:  Diagnosis Date    Allergy    Bilateral sensorineural hearing loss 11/06/2016   Cancer ([place])    skin cancer - on back, forehead   Chronic congestive heart failure ([place]) 02/22/2018   Cryptogenic stroke ([place])    followed by Dr Jaynee Eagles   Diverticulosis of colon 2007   Dr Olevia Perches   Dysrhythmia    Gallstones 01/20/2014   12/2013 multiple gallstones, largest 1.7 cm in diameter; asymptomatic    History of basal cell carcinoma excision    X3   History of colon polyps    NON-CANCEROUS   History of kidney stones    Hyperlipidemia    OA (osteoarthritis of spine)    Palpitations 11/24/2007   Qualifier: Diagnosis of  By: Linna Darner MD, William     Pancreatic cyst 01/20/2014   01/03/14 8 x 11 mm nonaggressive cystic lesion the pancreas. Followup recommended in one year.    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)   CLOSED MANIPULATION POST TOTAL  KNEE ARTHROPLASTY  07/13/2006   COLONOSCOPY W/ POLYPECTOMY  2002   adenomatous; Dr Olevia Perches   epidural steroid injections     two rounds of injections; lumbar spine    EXTRACORPOREAL SHOCK WAVE LITHOTRIPSY  X3   FACELIFT W/BLEPHAROPLASTY  2005   HYSTEROSCOPY WITH D & C  12/22/2000   implantable loop recorder removal  10/29/2020   MDT Reveal LINQ removed by Dr Rayann Heman   LOOP RECORDER INSERTION N/A 05/21/2017   Procedure: LOOP RECORDER INSERTION;  Surgeon: Thompson Grayer, MD;  Location: [facility];  Service: Cardiovascular;  Laterality: N/A;   OPEN KNEE MENISECTOMY  1968   RIGHT URETEROSCOPIC STONE EXTRACTION / STENT PLACEMENT  08/21/2008   rotator cuff on right shoulder     TONSILLECTOMY   1950   TOTAL KNEE ARTHROPLASTY Right 05/30/2006   TUBAL LIGATION     WISDOM TOOTH EXTRACTION      No current facility-administered medications for this encounter.   Current Outpatient Medications  Medication Sig Dispense Refill Last Dose   acetaminophen (TYLENOL) 650 MG CR tablet Take 1,300 mg by mouth every 8 (eight) hours as needed for pain.      alendronate (FOSAMAX) 70 MG tablet Take 70 mg by mouth once a week.      apixaban (ELIQUIS) 5 MG TABS tablet Take 5 mg by mouth in the morning and at bedtime.      Calcium Carbonate Antacid (TUMS PO) Take 1 tablet by mouth as needed (Heartburn).      Carboxymethylcellulose Sodium (EYE DROPS OP) Place 1 drop into both eyes as needed (redness/irritation).      Cholecalciferol (VITAMIN D) 125 MCG (5000 UT) CAPS Take 5,000 Units by mouth daily.      diltiazem (CARDIZEM) 120 MG tablet TAKE ONE TABLET BY MOUTH DAILY (Patient taking differently: Take 120 mg by mouth daily.) 30 tablet 0    diphenhydramine-acetaminophen (TYLENOL PM) 25-500 MG TABS tablet Take 1 tablet by mouth at bedtime.      fluticasone (FLONASE) 50 MCG/ACT nasal spray Place 1 spray into both nostrils daily.      pravastatin (PRAVACHOL) 40 MG tablet Take 40 mg by mouth daily.      predniSONE (DELTASONE) 5 MG tablet Take 15 mg by mouth daily with breakfast.      traMADol (ULTRAM) 50 MG tablet Take 50 mg by mouth every 6 (six) hours as needed for moderate pain.      Allergies  Allergen Reactions   Morphine Other (See Comments)    Respiratory compromise post op   Penicillamine Itching   Penicillins Rash    Has patient had a PCN reaction causing immediate rash, facial/tongue/throat swelling, SOB or lightheadedness with hypotension: No Has patient had a PCN reaction causing severe rash involving mucus membranes or skin necrosis: No Has patient had a PCN reaction that required hospitalization: No Has patient had a PCN reaction occurring within the last 10 years: No If all of the above  answers are "NO", then may proceed with Cephalosporin use. urticaria   Levofloxacin Other (See Comments)     SHOULDER PAIN    Social History   Tobacco Use   Smoking status: Never   Smokeless tobacco: Never  Substance Use Topics   Alcohol use: Yes    Alcohol/week: 5.0 standard drinks of alcohol    Types: 5 Glasses of wine per week    Comment: 5 or less .soc.    Family History  Problem Relation Age of Onset   Heart attack Mother 31   Emphysema Father        smoker   Colon polyps Father    Breast cancer Sister        late 106s   Cancer Paternal Grandfather  throat cancer   Esophageal cancer Paternal Grandfather    Heart attack Maternal Grandfather 76   Diabetes Neg Hx    Stroke Neg Hx    Colon cancer Neg Hx    Rectal cancer Neg Hx    Stomach cancer Neg Hx      Review of Systems  Constitutional:  Negative for chills and fever.  Respiratory:  Negative for cough and shortness of breath.   Cardiovascular:  Negative for chest pain.  Gastrointestinal:  Negative for nausea and vomiting.  Musculoskeletal:  Positive for arthralgias.     Objective:  Physical Exam Well nourished and well developed. General: Alert and oriented x3, cooperative and pleasant, no acute distress. Head: normocephalic, atraumatic, neck supple. Eyes: EOMI.  Musculoskeletal: Left hip exam: Painful and limited left hip range of motion consistent with arthritic changes as opposed to any other issues Weakness with active hip flexion due to pain at 5 -/5 strength She is otherwise neurovascular intact distally  Calves soft and nontender. Motor function intact in LE. Strength 5/5 LE bilaterally. Neuro: Distal pulses 2+. Sensation to light touch intact in LE.  Vital signs in last 24 hours:    Labs:   Estimated body mass index is 27.1 kg/m as calculated from the following:   Height as of 11/19/22: 5\' 7"  (1.702 m).   Weight as of 11/19/22: 78.5 kg.   Imaging Review Plain radiographs  demonstrate severe degenerative joint disease of the left hip(s). The bone quality appears to be adequate for age and reported activity level.      Assessment/Plan:  End stage arthritis, left hip(s)  The patient history, physical examination, clinical judgement of the provider and imaging studies are consistent with end stage degenerative joint disease of the left hip(s) and total hip arthroplasty is deemed medically necessary. The treatment options including medical management, injection therapy, arthroscopy and arthroplasty were discussed at length. The risks and benefits of total hip arthroplasty were presented and reviewed. The risks due to aseptic loosening, infection, stiffness, dislocation/subluxation,  thromboembolic complications and other imponderables were discussed.  The patient acknowledged the explanation, agreed to proceed with the plan and consent was signed. Patient is being admitted for inpatient treatment for surgery, pain control, PT, OT, prophylactic antibiotics, VTE prophylaxis, progressive ambulation and ADL's and discharge planning.The patient is planning to be discharged  home.   Therapy Plans: HEP Disposition: Home with husband Planned DVT Prophylaxis: Eliquis 5 BID DME needed: none PCP: Dr. Virgina Jock, clearance received Cardio: Dr. Rayann Heman, clearance received TXA: IV Allergies: levaquin - muscle pain, morphine - code blue? , PCN - itching with knee surgery in '68 Anesthesia Concerns: nausea/vomiting BMI: 27.8 Last HgbA1c: Not diabetic   Other: - tramadol/hydromorphone, tylenol, robaxin   Costella Hatcher, PA-C Orthopedic Surgery [facility] [place] [phone]

## 2022-12-02 ENCOUNTER — Encounter (HOSPITAL_COMMUNITY): Payer: Self-pay | Admitting: Orthopedic Surgery

## 2022-12-02 ENCOUNTER — Observation Stay (HOSPITAL_COMMUNITY): Payer: Medicare Other

## 2022-12-02 ENCOUNTER — Observation Stay (HOSPITAL_COMMUNITY)
Admission: RE | Admit: 2022-12-02 | Discharge: 2022-12-03 | Disposition: A | Discharge status: Home / Self Care | Payer: Medicare Other | Source: Ambulatory Visit | Attending: Orthopedic Surgery | Admitting: Orthopedic Surgery

## 2022-12-02 ENCOUNTER — Ambulatory Visit (HOSPITAL_COMMUNITY): Payer: Medicare Other

## 2022-12-02 ENCOUNTER — Ambulatory Visit (HOSPITAL_COMMUNITY): Payer: Medicare Other | Admitting: Emergency Medicine

## 2022-12-02 ENCOUNTER — Ambulatory Visit (HOSPITAL_BASED_OUTPATIENT_CLINIC_OR_DEPARTMENT_OTHER): Payer: Medicare Other | Admitting: Anesthesiology

## 2022-12-02 ENCOUNTER — Other Ambulatory Visit: Payer: Self-pay

## 2022-12-02 ENCOUNTER — Encounter (HOSPITAL_COMMUNITY)
Admission: RE | Disposition: A | Discharge status: Home / Self Care | Payer: Self-pay | Source: Ambulatory Visit | Attending: Orthopedic Surgery

## 2022-12-02 DIAGNOSIS — Z471 Aftercare following joint replacement surgery: Secondary | ICD-10-CM | POA: Diagnosis not present

## 2022-12-02 DIAGNOSIS — Z96651 Presence of right artificial knee joint: Secondary | ICD-10-CM | POA: Insufficient documentation

## 2022-12-02 DIAGNOSIS — Z79899 Other long term (current) drug therapy: Secondary | ICD-10-CM | POA: Insufficient documentation

## 2022-12-02 DIAGNOSIS — M1612 Unilateral primary osteoarthritis, left hip: Secondary | ICD-10-CM | POA: Diagnosis not present

## 2022-12-02 DIAGNOSIS — I509 Heart failure, unspecified: Secondary | ICD-10-CM | POA: Insufficient documentation

## 2022-12-02 DIAGNOSIS — Z7901 Long term (current) use of anticoagulants: Secondary | ICD-10-CM | POA: Diagnosis not present

## 2022-12-02 DIAGNOSIS — Z86711 Personal history of pulmonary embolism: Secondary | ICD-10-CM | POA: Insufficient documentation

## 2022-12-02 DIAGNOSIS — M25752 Osteophyte, left hip: Secondary | ICD-10-CM | POA: Diagnosis not present

## 2022-12-02 DIAGNOSIS — Z8673 Personal history of transient ischemic attack (TIA), and cerebral infarction without residual deficits: Secondary | ICD-10-CM

## 2022-12-02 DIAGNOSIS — Z85828 Personal history of other malignant neoplasm of skin: Secondary | ICD-10-CM | POA: Diagnosis not present

## 2022-12-02 DIAGNOSIS — Z96642 Presence of left artificial hip joint: Secondary | ICD-10-CM

## 2022-12-02 DIAGNOSIS — J45909 Unspecified asthma, uncomplicated: Secondary | ICD-10-CM | POA: Diagnosis not present

## 2022-12-02 HISTORY — PX: TOTAL HIP ARTHROPLASTY: SHX124

## 2022-12-02 SURGERY — ARTHROPLASTY, HIP, TOTAL, ANTERIOR APPROACH
Anesthesia: Spinal | Site: Hip | Laterality: Left

## 2022-12-02 MED ORDER — TRANEXAMIC ACID-NACL 1000-0.7 MG/100ML-% IV SOLN
1000.0000 mg | INTRAVENOUS | Status: AC
  Administered 2022-12-02: 1000 mg via INTRAVENOUS
  Filled 2022-12-02: qty 100

## 2022-12-02 MED ORDER — 0.9 % SODIUM CHLORIDE (POUR BTL) OPTIME
TOPICAL | Status: DC | PRN
  Administered 2022-12-02: 1000 mL

## 2022-12-02 MED ORDER — FENTANYL CITRATE PF 50 MCG/ML IJ SOSY
25.0000 ug | PREFILLED_SYRINGE | INTRAMUSCULAR | Status: DC | PRN
  Administered 2022-12-02 (×3): 50 ug via INTRAVENOUS

## 2022-12-02 MED ORDER — AMISULPRIDE (ANTIEMETIC) 5 MG/2ML IV SOLN: 10.0000 mg | Freq: Once | INTRAVENOUS | Status: DC | PRN

## 2022-12-02 MED ORDER — FLUTICASONE PROPIONATE 50 MCG/ACT NA SUSP: 1.0000 | Freq: Every day | NASAL | Status: DC | PRN

## 2022-12-02 MED ORDER — METHOCARBAMOL 500 MG PO TABS
500.0000 mg | ORAL_TABLET | Freq: Four times a day (QID) | ORAL | Status: DC | PRN
  Administered 2022-12-02 – 2022-12-03 (×2): 500 mg via ORAL
  Filled 2022-12-02 (×2): qty 1

## 2022-12-02 MED ORDER — ROCURONIUM BROMIDE 10 MG/ML (PF) SYRINGE
PREFILLED_SYRINGE | INTRAVENOUS | Status: DC | PRN
  Administered 2022-12-02: 30 mg via INTRAVENOUS
  Administered 2022-12-02: 40 mg via INTRAVENOUS

## 2022-12-02 MED ORDER — ONDANSETRON HCL 4 MG/2ML IJ SOLN: 4.0000 mg | Freq: Once | INTRAMUSCULAR | Status: DC | PRN

## 2022-12-02 MED ORDER — TRAMADOL HCL 50 MG PO TABS
50.0000 mg | ORAL_TABLET | Freq: Three times a day (TID) | ORAL | Status: DC | PRN
  Administered 2022-12-02: 50 mg via ORAL
  Filled 2022-12-02: qty 1
  Filled 2022-12-02: qty 2

## 2022-12-02 MED ORDER — LACTATED RINGERS IV SOLN: INTRAVENOUS | Status: DC

## 2022-12-02 MED ORDER — PROPOFOL 500 MG/50ML IV EMUL
INTRAVENOUS | Status: DC | PRN
  Administered 2022-12-02: 150 mg via INTRAVENOUS

## 2022-12-02 MED ORDER — DOCUSATE SODIUM 100 MG PO CAPS
100.0000 mg | ORAL_CAPSULE | Freq: Two times a day (BID) | ORAL | Status: DC
  Administered 2022-12-02 – 2022-12-03 (×2): 100 mg via ORAL
  Filled 2022-12-02 (×2): qty 1

## 2022-12-02 MED ORDER — METOCLOPRAMIDE HCL 5 MG/ML IJ SOLN: 5.0000 mg | Freq: Three times a day (TID) | INTRAMUSCULAR | Status: DC | PRN

## 2022-12-02 MED ORDER — PHENOL 1.4 % MT LIQD: 1.0000 | OROMUCOSAL | Status: DC | PRN

## 2022-12-02 MED ORDER — BUPIVACAINE HCL 0.25 % IJ SOLN
INTRAMUSCULAR | Status: AC
  Filled 2022-12-02: qty 1

## 2022-12-02 MED ORDER — FENTANYL CITRATE PF 50 MCG/ML IJ SOSY
PREFILLED_SYRINGE | INTRAMUSCULAR | Status: AC
  Filled 2022-12-02: qty 1

## 2022-12-02 MED ORDER — PRAVASTATIN SODIUM 20 MG PO TABS
40.0000 mg | ORAL_TABLET | Freq: Every day | ORAL | Status: DC
  Administered 2022-12-02 – 2022-12-03 (×2): 40 mg via ORAL
  Filled 2022-12-02 (×2): qty 2

## 2022-12-02 MED ORDER — FENTANYL CITRATE (PF) 100 MCG/2ML IJ SOLN
INTRAMUSCULAR | Status: AC
  Filled 2022-12-02: qty 2

## 2022-12-02 MED ORDER — METHOCARBAMOL 500 MG IVPB - SIMPLE MED
INTRAVENOUS | Status: AC
  Filled 2022-12-02: qty 55

## 2022-12-02 MED ORDER — FENTANYL CITRATE (PF) 100 MCG/2ML IJ SOLN
INTRAMUSCULAR | Status: DC | PRN
  Administered 2022-12-02 (×2): 50 ug via INTRAVENOUS
  Administered 2022-12-02: 100 ug via INTRAVENOUS
  Administered 2022-12-02: 50 ug via INTRAVENOUS

## 2022-12-02 MED ORDER — HYDROMORPHONE HCL 1 MG/ML IJ SOLN: 0.5000 mg | INTRAMUSCULAR | Status: DC | PRN

## 2022-12-02 MED ORDER — PROPOFOL 500 MG/50ML IV EMUL: INTRAVENOUS | Status: DC | PRN

## 2022-12-02 MED ORDER — DEXAMETHASONE SODIUM PHOSPHATE 10 MG/ML IJ SOLN
10.0000 mg | Freq: Once | INTRAMUSCULAR | Status: AC
  Administered 2022-12-03: 10 mg via INTRAVENOUS
  Filled 2022-12-02: qty 1

## 2022-12-02 MED ORDER — SODIUM CHLORIDE (PF) 0.9 % IJ SOLN
INTRAMUSCULAR | Status: DC | PRN
  Administered 2022-12-02: 30 mL

## 2022-12-02 MED ORDER — POLYETHYLENE GLYCOL 3350 17 G PO PACK
17.0000 g | PACK | Freq: Two times a day (BID) | ORAL | Status: DC
  Administered 2022-12-02 – 2022-12-03 (×2): 17 g via ORAL
  Filled 2022-12-02 (×2): qty 1

## 2022-12-02 MED ORDER — TRAMADOL HCL 50 MG PO TABS
ORAL_TABLET | ORAL | Status: AC
  Administered 2022-12-02: 50 mg
  Filled 2022-12-02: qty 1

## 2022-12-02 MED ORDER — CEFAZOLIN SODIUM-DEXTROSE 2-4 GM/100ML-% IV SOLN
2.0000 g | INTRAVENOUS | Status: AC
  Administered 2022-12-02: 2 g via INTRAVENOUS
  Filled 2022-12-02: qty 100

## 2022-12-02 MED ORDER — KETOROLAC TROMETHAMINE 30 MG/ML IJ SOLN
INTRAMUSCULAR | Status: AC
  Filled 2022-12-02: qty 1

## 2022-12-02 MED ORDER — CEFAZOLIN SODIUM-DEXTROSE 2-4 GM/100ML-% IV SOLN
2.0000 g | Freq: Four times a day (QID) | INTRAVENOUS | Status: AC
  Administered 2022-12-02 (×2): 2 g via INTRAVENOUS
  Filled 2022-12-02 (×2): qty 100

## 2022-12-02 MED ORDER — SUGAMMADEX SODIUM 200 MG/2ML IV SOLN
INTRAVENOUS | Status: DC | PRN
  Administered 2022-12-02: 200 mg via INTRAVENOUS

## 2022-12-02 MED ORDER — ACETAMINOPHEN 10 MG/ML IV SOLN: 1000.0000 mg | Freq: Once | INTRAVENOUS | Status: DC | PRN

## 2022-12-02 MED ORDER — IPRATROPIUM-ALBUTEROL 0.5-2.5 (3) MG/3ML IN SOLN
RESPIRATORY_TRACT | Status: AC
  Administered 2022-12-02: 3 mL
  Filled 2022-12-02: qty 3

## 2022-12-02 MED ORDER — METHOCARBAMOL 500 MG IVPB - SIMPLE MED
500.0000 mg | Freq: Four times a day (QID) | INTRAVENOUS | Status: DC | PRN
  Administered 2022-12-02: 500 mg via INTRAVENOUS

## 2022-12-02 MED ORDER — BISACODYL 10 MG RE SUPP: 10.0000 mg | Freq: Every day | RECTAL | Status: DC | PRN

## 2022-12-02 MED ORDER — CHLORHEXIDINE GLUCONATE 0.12 % MT SOLN
15.0000 mL | Freq: Once | OROMUCOSAL | Status: AC
  Administered 2022-12-02: 15 mL via OROMUCOSAL

## 2022-12-02 MED ORDER — ONDANSETRON HCL 4 MG PO TABS: 4.0000 mg | ORAL_TABLET | Freq: Four times a day (QID) | ORAL | Status: DC | PRN

## 2022-12-02 MED ORDER — ONDANSETRON HCL 4 MG/2ML IJ SOLN
INTRAMUSCULAR | Status: DC | PRN
  Administered 2022-12-02: 4 mg via INTRAVENOUS

## 2022-12-02 MED ORDER — ONDANSETRON HCL 4 MG/2ML IJ SOLN: 4.0000 mg | Freq: Four times a day (QID) | INTRAMUSCULAR | Status: DC | PRN

## 2022-12-02 MED ORDER — MENTHOL 3 MG MT LOZG: 1.0000 | LOZENGE | OROMUCOSAL | Status: DC | PRN

## 2022-12-02 MED ORDER — DEXAMETHASONE SODIUM PHOSPHATE 10 MG/ML IJ SOLN: 8.0000 mg | Freq: Once | INTRAMUSCULAR | Status: DC

## 2022-12-02 MED ORDER — POVIDONE-IODINE 10 % EX SWAB
2.0000 | Freq: Once | CUTANEOUS | Status: AC
  Administered 2022-12-02: 2 via TOPICAL

## 2022-12-02 MED ORDER — EPINEPHRINE PF 1 MG/ML IJ SOLN
INTRAMUSCULAR | Status: AC
  Filled 2022-12-02: qty 1

## 2022-12-02 MED ORDER — ACETAMINOPHEN 325 MG PO TABS
325.0000 mg | ORAL_TABLET | Freq: Four times a day (QID) | ORAL | Status: DC | PRN
  Administered 2022-12-03: 650 mg via ORAL
  Filled 2022-12-02: qty 2

## 2022-12-02 MED ORDER — TRANEXAMIC ACID-NACL 1000-0.7 MG/100ML-% IV SOLN
1000.0000 mg | Freq: Once | INTRAVENOUS | Status: AC
  Administered 2022-12-02: 1000 mg via INTRAVENOUS
  Filled 2022-12-02: qty 100

## 2022-12-02 MED ORDER — PHENYLEPHRINE HCL (PRESSORS) 10 MG/ML IV SOLN
INTRAVENOUS | Status: DC | PRN
  Administered 2022-12-02: 160 ug via INTRAVENOUS

## 2022-12-02 MED ORDER — SODIUM CHLORIDE (PF) 0.9 % IJ SOLN
INTRAMUSCULAR | Status: AC
  Filled 2022-12-02: qty 30

## 2022-12-02 MED ORDER — METOCLOPRAMIDE HCL 5 MG PO TABS: 5.0000 mg | ORAL_TABLET | Freq: Three times a day (TID) | ORAL | Status: DC | PRN

## 2022-12-02 MED ORDER — ORAL CARE MOUTH RINSE: 15.0000 mL | Freq: Once | OROMUCOSAL | Status: AC

## 2022-12-02 MED ORDER — DEXAMETHASONE SODIUM PHOSPHATE 10 MG/ML IJ SOLN
INTRAMUSCULAR | Status: DC | PRN
  Administered 2022-12-02: 10 mg

## 2022-12-02 MED ORDER — DILTIAZEM HCL 60 MG PO TABS
120.0000 mg | ORAL_TABLET | Freq: Every day | ORAL | Status: DC
  Administered 2022-12-03: 120 mg via ORAL
  Filled 2022-12-02: qty 2

## 2022-12-02 MED ORDER — APIXABAN 5 MG PO TABS
5.0000 mg | ORAL_TABLET | Freq: Two times a day (BID) | ORAL | Status: DC
  Administered 2022-12-03: 5 mg via ORAL
  Filled 2022-12-02: qty 1

## 2022-12-02 MED ORDER — SODIUM CHLORIDE 0.9 % IV SOLN: INTRAVENOUS | Status: DC

## 2022-12-02 MED ORDER — HYDROMORPHONE HCL 2 MG PO TABS
1.0000 mg | ORAL_TABLET | ORAL | Status: DC | PRN
  Administered 2022-12-02 – 2022-12-03 (×3): 2 mg via ORAL
  Filled 2022-12-02 (×3): qty 1

## 2022-12-02 MED ORDER — BUPIVACAINE-EPINEPHRINE (PF) 0.25% -1:200000 IJ SOLN
INTRAMUSCULAR | Status: DC | PRN
  Administered 2022-12-02: 30 mL

## 2022-12-02 MED ORDER — DIPHENHYDRAMINE HCL 12.5 MG/5ML PO ELIX: 12.5000 mg | ORAL_SOLUTION | ORAL | Status: DC | PRN

## 2022-12-02 MED ORDER — CALCIUM CARBONATE ANTACID 500 MG PO CHEW: 1.0000 | CHEWABLE_TABLET | ORAL | Status: DC | PRN

## 2022-12-02 MED ORDER — KETOROLAC TROMETHAMINE 30 MG/ML IJ SOLN
INTRAMUSCULAR | Status: DC | PRN
  Administered 2022-12-02: 30 mg

## 2022-12-02 MED ORDER — STERILE WATER FOR IRRIGATION IR SOLN
Status: DC | PRN
  Administered 2022-12-02: 2000 mL

## 2022-12-02 SURGICAL SUPPLY — 41 items
ADH SKN CLS APL DERMABOND .7 (GAUZE/BANDAGES/DRESSINGS) ×1
BAG COUNTER SPONGE SURGICOUNT (BAG) IMPLANT
BAG DECANTER FOR FLEXI CONT (MISCELLANEOUS) IMPLANT
BAG SPEC THK2 15X12 ZIP CLS (MISCELLANEOUS)
BAG SPNG CNTER NS LX DISP (BAG)
BAG ZIPLOCK 12X15 (MISCELLANEOUS) IMPLANT
BLADE SAG 18X100X1.27 (BLADE) ×1 IMPLANT
COVER PERINEAL POST (MISCELLANEOUS) ×1 IMPLANT
COVER SURGICAL LIGHT HANDLE (MISCELLANEOUS) ×1 IMPLANT
CUP ACETBLR 52 OD PINNACLE (Hips) IMPLANT
DERMABOND ADVANCED .7 DNX12 (GAUZE/BANDAGES/DRESSINGS) ×1 IMPLANT
DRAPE FOOT SWITCH (DRAPES) ×1 IMPLANT
DRAPE STERI IOBAN 125X83 (DRAPES) ×1 IMPLANT
DRAPE U-SHAPE 47X51 STRL (DRAPES) ×2 IMPLANT
DRESSING AQUACEL AG SP 3.5X10 (GAUZE/BANDAGES/DRESSINGS) ×1 IMPLANT
DRSG AQUACEL AG SP 3.5X10 (GAUZE/BANDAGES/DRESSINGS) ×1
DURAPREP 26ML APPLICATOR (WOUND CARE) ×1 IMPLANT
ELECT REM PT RETURN 15FT ADLT (MISCELLANEOUS) ×1 IMPLANT
GLOVE BIO SURGEON STRL SZ 6 (GLOVE) ×1 IMPLANT
GLOVE BIOGEL PI IND STRL 6.5 (GLOVE) ×1 IMPLANT
GLOVE BIOGEL PI IND STRL 7.5 (GLOVE) ×1 IMPLANT
GLOVE ORTHO TXT STRL SZ7.5 (GLOVE) ×2 IMPLANT
GOWN STRL REUS W/ TWL LRG LVL3 (GOWN DISPOSABLE) ×2 IMPLANT
GOWN STRL REUS W/TWL LRG LVL3 (GOWN DISPOSABLE) ×2
HEAD M SROM 36MM PLUS 1.5 (Hips) IMPLANT
HOLDER FOLEY CATH W/STRAP (MISCELLANEOUS) ×1 IMPLANT
KIT TURNOVER KIT A (KITS) IMPLANT
LINER NEUTRAL 52X36MM PLUS 4 (Liner) IMPLANT
PACK ANTERIOR HIP CUSTOM (KITS) ×1 IMPLANT
SCREW 6.5MMX30MM (Screw) IMPLANT
SROM M HEAD 36MM PLUS 1.5 (Hips) ×1 IMPLANT
STEM FEMORAL SZ5 HIGH ACTIS (Stem) IMPLANT
SUT MNCRL AB 4-0 PS2 18 (SUTURE) ×1 IMPLANT
SUT STRATAFIX 0 PDS 27 VIOLET (SUTURE) ×1
SUT VIC AB 1 CT1 36 (SUTURE) ×3 IMPLANT
SUT VIC AB 2-0 CT1 27 (SUTURE) ×2
SUT VIC AB 2-0 CT1 TAPERPNT 27 (SUTURE) ×2 IMPLANT
SUTURE STRATFX 0 PDS 27 VIOLET (SUTURE) ×1 IMPLANT
TRAY FOLEY MTR SLVR 16FR STAT (SET/KITS/TRAYS/PACK) IMPLANT
TUBE SUCTION HIGH CAP CLEAR NV (SUCTIONS) ×1 IMPLANT
WATER STERILE IRR 1000ML POUR (IV SOLUTION) ×1 IMPLANT

## 2022-12-02 NOTE — Discharge Instructions (Signed)

## 2022-12-02 NOTE — Care Plan (Signed)
Ortho Bundle Case Management Note  Patient Details  Name: Sarah Valdez MRN: [id number] Date of Birth: [birth year]                  L THA on 12/02/22.  DCP: Home with husband.  DME: No needs. Has RW.  PT: HEP   DME Arranged:  N/A DME Agency:       Additional Comments: Please contact me with any questions of if this plan should need to change.   Ennis Forts, RN,CCM [facility]  [phone] 12/02/2022, 9:53 AM

## 2022-12-02 NOTE — Anesthesia Postprocedure Evaluation (Signed)
Anesthesia Post Note  Patient: Sarah Valdez  Procedure(s) Performed: TOTAL HIP ARTHROPLASTY ANTERIOR APPROACH (Left: Hip)     Patient location during evaluation: PACU Anesthesia Type: General Level of consciousness: awake Pain management: pain level controlled Vital Signs Assessment: post-procedure vital signs reviewed and stable Respiratory status: spontaneous breathing, nonlabored ventilation and respiratory function stable Cardiovascular status: blood pressure returned to baseline and stable Postop Assessment: no apparent nausea or vomiting Anesthetic complications: no   No notable events documented.  Last Vitals:  Vitals:   12/02/22 1445 12/02/22 1500  BP: 127/68 125/72  Pulse: 73 80  Resp: 18 17  Temp:    SpO2: 100% 100%    Last Pain:  Vitals:   12/02/22 1500  TempSrc:   PainSc: 6                  Sarah Valdez P Sarah Valdez

## 2022-12-02 NOTE — Transfer of Care (Signed)
Immediate Anesthesia Transfer of Care Note  Patient: Sarah Valdez  Procedure(s) Performed: TOTAL HIP ARTHROPLASTY ANTERIOR APPROACH (Left: Hip)  Patient Location: PACU  Anesthesia Type:General  Level of Consciousness: sedated, patient cooperative, and responds to stimulation  Airway & Oxygen Therapy: Patient Spontanous Breathing and Patient connected to face mask oxygen  Post-op Assessment: Report given to RN and Post -op Vital signs reviewed and stable  Post vital signs: Reviewed and stable  Last Vitals:  Vitals Value Taken Time  BP 155/90 12/02/22 1322  Temp    Pulse 85 12/02/22 1324  Resp 13 12/02/22 1324  SpO2 99 % 12/02/22 1324  Vitals shown include unvalidated device data.  Last Pain:  Vitals:   12/02/22 0951  TempSrc:   PainSc: 7          Complications: No notable events documented.

## 2022-12-02 NOTE — Op Note (Signed)
NAME:  Sarah Valdez                ACCOUNT NO.: [id number]      MEDICAL RECORD NO.: [id number]      [facility]      PHYSICIAN:  Shelda PalMatthew D Avyaan Summer  DATE OF BIRTH:  04/14/1946     DATE OF PROCEDURE:  12/02/2022                                 OPERATIVE REPORT         PREOPERATIVE DIAGNOSIS: Left  hip osteoarthritis.      POSTOPERATIVE DIAGNOSIS:  Left hip osteoarthritis.      PROCEDURE:  Left total hip replacement through an anterior approach   utilizing DePuy THR system, component size 52 mm  pinnacle cup, a size 36+4 neutral   Altrex liner, a size 5 Hi Actis stem with a 36+1.5 Articuleze metal head ball.      SURGEON:  Madlyn FrankelMatthew D. Charlann Boxerlin, M.D.      ASSISTANT:  Rosalene BillingsAshley Donovan, PA-C     ANESTHESIA:  Spinal.      SPECIMENS:  None.      COMPLICATIONS:  None.      BLOOD LOSS:  700 cc     DRAINS:  None.      INDICATION OF THE PROCEDURE:  Sarah Valdez is a 77 y.o. female who had   presented to office for evaluation of left hip pain.  Radiographs revealed   progressive degenerative changes with bone-on-bone   articulation of the  hip joint, including subchondral cystic changes and osteophytes.  The patient had painful limited range of   motion significantly affecting their overall quality of life and function.  The patient was failing to    respond to conservative measures including medications and/or injections and activity modification and at this point was ready   to proceed with more definitive measures.  Consent was obtained for   benefit of pain relief.  Specific risks of infection, DVT, component   failure, dislocation, neurovascular injury, and need for revision surgery were reviewed in the office.     PROCEDURE IN DETAIL:  The patient was brought to operative theater.   Once adequate anesthesia, preoperative antibiotics, 2 gm of Ancef, 1 gm of Tranexamic Acid, and 10 mg of Decadron were administered, the patient was positioned supine on the  [facility] [employer] [place] table.  Once the patient was safely positioned with adequate padding of boney prominences we predraped out the hip, and used fluoroscopy to confirm orientation of the pelvis.      The left hip was then prepped and draped from proximal iliac crest to   mid thigh with a shower curtain technique.      Time-out was performed identifying the patient, planned procedure, and the appropriate extremity.     An incision was then made 2 cm lateral to the   anterior superior iliac spine extending over the orientation of the   tensor fascia lata muscle and sharp dissection was carried down to the   fascia of the muscle.      The fascia was then incised.  The muscle belly was identified and swept   laterally and retractor placed along the superior neck.  Following   cauterization of the circumflex vessels and removing some pericapsular   fat, a second cobra retractor was placed on the inferior  neck.  A T-capsulotomy was made along the line of the   superior neck to the trochanteric fossa, then extended proximally and   distally.  Tag sutures were placed and the retractors were then placed   intracapsular.  We then identified the trochanteric fossa and   orientation of my neck cut and then made a neck osteotomy with the femur on traction.  The femoral   head was removed without difficulty or complication.  Traction was let   off and retractors were placed posterior and anterior around the   acetabulum.      The labrum and foveal tissue were debrided.  I began reaming with a 48 mm   reamer and reamed up to 51 mm reamer with good bony bed preparation and a 52 mm  cup was chosen.  The final 52 mm Pinnacle cup was then impacted under fluoroscopy to confirm the depth of penetration and orientation with respect to   Abduction and forward flexion.  A screw was placed into the ilium followed by the hole eliminator.  The final   36+4 neutral Altrex liner was impacted with good visualized rim fit.  The  cup was positioned anatomically within the acetabular portion of the pelvis.      At this point, the femur was rolled to 100 degrees.  Further capsule was   released off the inferior aspect of the femoral neck.  I then   released the superior capsule proximally.  With the leg in a neutral position the hook was placed laterally   along the femur under the vastus lateralis origin and elevated manually and then held in position using the hook attachment on the bed.  The leg was then extended and adducted with the leg rolled to 100   degrees of external rotation.  Retractors were placed along the medial calcar and posteriorly over the greater trochanter.  Once the proximal femur was fully   exposed, I used a box osteotome to set orientation.  I then began   broaching with the starting chili pepper broach and passed this by hand and then broached up to 5.  With the 5 broach in place I chose a high offset neck and did several trial reductions.  The offset was appropriate, leg lengths   appeared to be equal best matched with the +1.5 head ball trial confirmed radiographically.   Given these findings, I went ahead and dislocated the hip, repositioned all   retractors and positioned the right hip in the extended and abducted position.  The final 5 Hi Actis stem was   chosen and it was impacted down to the level of neck cut.  Based on this   and the trial reductions, a final 36+1.5 Articuleze metal head ball was chosen and   impacted onto a clean and dry trunnion, and the hip was reduced.  The   hip had been irrigated throughout the case again at this point.  I did   reapproximate the superior capsular leaflet to the anterior leaflet   using #1 Vicryl.  The fascia of the   tensor fascia lata muscle was then reapproximated using #1 Vicryl and #0 Stratafix sutures.  The   remaining wound was closed with 2-0 Vicryl and running 4-0 Monocryl.   The hip was cleaned, dried, and dressed sterilely using Dermabond  and   Aquacel dressing.  The patient was then brought   to recovery room in stable condition tolerating the procedure well.  Rosalene Billings, PA-C was present for the entirety of the case involved from   preoperative positioning, perioperative retractor management, general   facilitation of the case, as well as primary wound closure as assistant.            Madlyn Frankel Charlann Boxer, M.D.        12/02/2022 10:01 AM

## 2022-12-02 NOTE — Interval H&P Note (Signed)
History and Physical Interval Note:  12/02/2022 10:00 AM  Sarah Valdez  has presented today for surgery, with the diagnosis of Left hip osteoarthritis.  The various methods of treatment have been discussed with the patient and family. After consideration of risks, benefits and other options for treatment, the patient has consented to  Procedure(s): TOTAL HIP ARTHROPLASTY ANTERIOR APPROACH (Left) as a surgical intervention.  The patient's history has been reviewed, patient examined, no change in status, stable for surgery.  I have reviewed the patient's chart and labs.  Questions were answered to the patient's satisfaction.     Shelda Pal

## 2022-12-02 NOTE — Anesthesia Procedure Notes (Signed)
Procedure Name: Intubation Date/Time: 12/02/2022 11:41 AM  Performed by: Kizzie Fantasia, CRNAPre-anesthesia Checklist: Patient identified, Emergency Drugs available, Suction available, Patient being monitored and Timeout performed Patient Re-evaluated:Patient Re-evaluated prior to induction Oxygen Delivery Method: Circle system utilized Preoxygenation: Pre-oxygenation with 100% oxygen Induction Type: IV induction Ventilation: Mask ventilation without difficulty Laryngoscope Size: Mac and 4 Grade View: Grade I Tube type: Oral Tube size: 7.0 mm Number of attempts: 1 Airway Equipment and Method: Stylet Placement Confirmation: ETT inserted through vocal cords under direct vision, positive ETCO2 and breath sounds checked- equal and bilateral Secured at: 21 cm Tube secured with: Tape Dental Injury: Teeth and Oropharynx as per pre-operative assessment

## 2022-12-02 NOTE — Evaluation (Signed)
Physical Therapy Evaluation Patient Details Name: Sarah Valdez MRN: [id number] DOB: March 25, 1946 Today's Date: 12/02/2022  History of Present Illness  77 yo female presents to therapy s/p L THA, anterior approach on 12/02/2022 due to failure of conservative measures. [name] includes but is not limited to: B PE, tachycardia, HTN, CHF, CVA, HOH, HDL,  and R TKA (2007) with subsequent manipulation.  Clinical Impression    Sarah Valdez is a 77 y.o. female POD 0 s/p L THA, AA. Patient reports mod I at [facility] level with mobility at baseline. Patient is now limited by functional impairments (see PT problem list below) and requires min guard with HOB elevated for bed mobility and min guard and cues for transfers with the exception of commode with min A for STS. Patient was able to ambulate 24 feet with RW and min guard level of assist. Patient instructed in exercise to facilitate ROM and circulation to manage edema. Patient will benefit from continued skilled PT interventions to address impairments and progress towards PLOF. Acute PT will follow to progress mobility and stair training in preparation for safe discharge home with family support and HEP.      Recommendations for follow up therapy are one component of a multi-disciplinary discharge planning process, led by the attending physician.  Recommendations may be updated based on patient status, additional functional criteria and insurance authorization.  Follow Up Recommendations       Assistance Recommended at Discharge Intermittent Supervision/Assistance  Patient can return home with the following  A little help with walking and/or transfers;A little help with bathing/dressing/bathroom;Assistance with cooking/housework;Assist for transportation;Help with stairs or ramp for entrance    Equipment Recommendations None recommended by PT (pt reports DME in home setting, pt maybe interested in 3 in 1)  Recommendations for Other Services        Functional Status Assessment Patient has had a recent decline in their functional status and demonstrates the ability to make significant improvements in function in a reasonable and predictable amount of time.     Precautions / Restrictions Precautions Precautions: Fall Restrictions Weight Bearing Restrictions: No      Mobility  Bed Mobility Overal bed mobility: Needs Assistance Bed Mobility: Supine to Sit     Supine to sit: Min guard, HOB elevated          Transfers Overall transfer level: Needs assistance Equipment used: Rolling walker (2 wheels) Transfers: Sit to/from Stand Sit to Stand: Min guard           General transfer comment: bed, recliner and commode, pt required min A for STS from lower commdoe, cues for proper UE and AD placement    Ambulation/Gait Ambulation/Gait assistance: Min guard Gait Distance (Feet): 24 Feet Assistive device: Rolling walker (2 wheels) Gait Pattern/deviations: Step-to pattern, Antalgic Gait velocity: decreased        Stairs            Wheelchair Mobility    Modified Rankin (Stroke Patients Only)       Balance Overall balance assessment: Needs assistance Sitting-balance support: Feet supported Sitting balance-Leahy Scale: Good     Standing balance support: Bilateral upper extremity supported, During functional activity, Reliant on assistive device for balance Standing balance-Leahy Scale: Poor                               Pertinent Vitals/Pain Pain Assessment Pain Assessment: 0-10 Pain Score: 6  Pain Location: L  hip Pain Descriptors / Indicators: Aching, Discomfort, Operative site guarding Pain Intervention(s): Limited activity within patient's tolerance, Monitored during session, Premedicated before session, Repositioned, Ice applied    Home Living Family/patient expects to be discharged to:: Private residence Living Arrangements: Spouse/significant other Available Help at Discharge:  Family Type of Home: House Home Access: Stairs to enter Entrance Stairs-Rails: Left Entrance Stairs-Number of Steps: 2   Home Layout: Multi-level;Able to live on main level with bedroom/bathroom Home Equipment: Rolling Walker (2 wheels);Cane - single point      Prior Function Prior Level of Function : Independent/Modified Independent             Mobility Comments: SPC for gait tasks mod I for ADLs, self care tasks and IADLs       Hand Dominance        Extremity/Trunk Assessment        Lower Extremity Assessment Lower Extremity Assessment: LLE deficits/detail LLE Deficits / Details: ankle DF/PF 5/5 LLE Sensation: WNL    Cervical / Trunk Assessment Cervical / Trunk Assessment: Normal  Communication   Communication: No difficulties  Cognition Arousal/Alertness: Awake/alert Behavior During Therapy: WFL for tasks assessed/performed Overall Cognitive Status: Within Functional Limits for tasks assessed                                          General Comments      Exercises Total Joint Exercises Ankle Circles/Pumps: AROM, Both, 20 reps   Assessment/Plan    PT Assessment All further PT needs can be met in the next venue of care  PT Problem List Decreased strength;Decreased range of motion;Decreased activity tolerance;Decreased balance;Decreased mobility;Decreased coordination;Decreased knowledge of use of DME;Pain       PT Treatment Interventions      PT Goals (Current goals can be found in the Care Plan section)  Acute Rehab PT Goals Patient Stated Goal: to get back to water arobics PT Goal Formulation: With patient Time For Goal Achievement: 12/16/22 Potential to Achieve Goals: Good    Frequency       Co-evaluation               AM-PAC PT "6 Clicks" Mobility  Outcome Measure Help needed turning from your back to your side while in a flat bed without using bedrails?: A Little Help needed moving from lying on your back to  sitting on the side of a flat bed without using bedrails?: A Little Help needed moving to and from a bed to a chair (including a wheelchair)?: A Little Help needed standing up from a chair using your arms (e.g., wheelchair or bedside chair)?: A Little Help needed to walk in hospital room?: A Little Help needed climbing 3-5 steps with a railing? : A Lot 6 Click Score: 17    End of Session Equipment Utilized During Treatment: Gait belt Activity Tolerance: Patient tolerated treatment well Patient left: in chair;with call bell/phone within reach (pt instuction provided on use of call bell and to await staff S and A for transfer tasks) Nurse Communication: Mobility status;Other (comment) (can use BSC with staff assist) PT Visit Diagnosis: Unsteadiness on feet (R26.81);Other abnormalities of gait and mobility (R26.89);Repeated falls (R29.6);Muscle weakness (generalized) (M62.81);History of falling (Z91.81);Difficulty in walking, not elsewhere classified (R26.2);Pain Pain - Right/Left: Left Pain - part of body: Hip    Time: [id number] PT Time Calculation (min) (ACUTE ONLY): 41 min  Charges:   PT Evaluation $PT Eval Low Complexity: 1 Low PT Treatments $Gait Training: 8-22 mins $Therapeutic Activity: 8-22 mins        Rica Mote, PT   Jacqualyn Posey 12/02/2022, 7:43 PM

## 2022-12-03 ENCOUNTER — Other Ambulatory Visit (HOSPITAL_COMMUNITY): Payer: Self-pay

## 2022-12-03 ENCOUNTER — Encounter (HOSPITAL_COMMUNITY): Payer: Self-pay | Admitting: Orthopedic Surgery

## 2022-12-03 DIAGNOSIS — I509 Heart failure, unspecified: Secondary | ICD-10-CM | POA: Diagnosis not present

## 2022-12-03 DIAGNOSIS — Z79899 Other long term (current) drug therapy: Secondary | ICD-10-CM | POA: Diagnosis not present

## 2022-12-03 DIAGNOSIS — Z85828 Personal history of other malignant neoplasm of skin: Secondary | ICD-10-CM | POA: Diagnosis not present

## 2022-12-03 DIAGNOSIS — Z96651 Presence of right artificial knee joint: Secondary | ICD-10-CM | POA: Diagnosis not present

## 2022-12-03 DIAGNOSIS — Z86711 Personal history of pulmonary embolism: Secondary | ICD-10-CM | POA: Diagnosis not present

## 2022-12-03 DIAGNOSIS — M1612 Unilateral primary osteoarthritis, left hip: Secondary | ICD-10-CM | POA: Diagnosis not present

## 2022-12-03 LAB — BASIC METABOLIC PANEL
Anion gap: 10 (ref 5–15)
BUN: 16 mg/dL (ref 8–23)
CO2: 21 mmol/L — ABNORMAL LOW (ref 22–32)
Calcium: 7.9 mg/dL — ABNORMAL LOW (ref 8.9–10.3)
Chloride: 104 mmol/L (ref 98–111)
Creatinine, Ser: 0.93 mg/dL (ref 0.44–1.00)
GFR, Estimated: >60 mL/min (ref >60)
Glucose, Bld: 174 mg/dL — ABNORMAL HIGH (ref 70–99)
Potassium: 3.7 mmol/L (ref 3.5–5.1)
Sodium: 135 mmol/L (ref 135–145)

## 2022-12-03 LAB — CBC
HCT: 30.3 % — ABNORMAL LOW (ref 36.0–46.0)
Hemoglobin: 9.9 g/dL — ABNORMAL LOW (ref 12.0–15.0)
MCH: 32.6 pg (ref 26.0–34.0)
MCHC: 32.7 g/dL (ref 30.0–36.0)
MCV: 99.7 fL (ref 80.0–100.0)
Platelets: 157 10*3/uL (ref 150–400)
RBC: 3.04 MIL/uL — ABNORMAL LOW (ref 3.87–5.11)
RDW: 15.6 % — ABNORMAL HIGH (ref 11.5–15.5)
WBC: 13.4 10*3/uL — ABNORMAL HIGH (ref 4.0–10.5)
nRBC: 0 % (ref 0.0–0.2)

## 2022-12-03 MED ORDER — FERROUS SULFATE 325 (65 FE) MG PO TABS: 325.0000 mg | ORAL_TABLET | Freq: Three times a day (TID) | ORAL | Status: DC

## 2022-12-03 MED ORDER — HYDROMORPHONE HCL 2 MG PO TABS
1.0000 mg | ORAL_TABLET | ORAL | 0 refills | Status: DC | PRN
  Filled 2022-12-03: qty 42, 7d supply, fill #0

## 2022-12-03 MED ORDER — SODIUM CHLORIDE 0.9 % IV BOLUS
500.0000 mL | Freq: Once | INTRAVENOUS | Status: AC
  Administered 2022-12-03: 500 mL via INTRAVENOUS

## 2022-12-03 MED ORDER — METHOCARBAMOL 500 MG PO TABS: 500.0000 mg | ORAL_TABLET | Freq: Four times a day (QID) | ORAL | 2 refills | Status: DC | PRN

## 2022-12-03 MED ORDER — ONDANSETRON HCL 4 MG PO TABS: 4.0000 mg | ORAL_TABLET | Freq: Four times a day (QID) | ORAL | 0 refills | Status: DC | PRN

## 2022-12-03 MED ORDER — HYDROMORPHONE HCL 2 MG PO TABS: 1.0000 mg | ORAL_TABLET | ORAL | 0 refills | Status: DC | PRN

## 2022-12-03 MED ORDER — POLYETHYLENE GLYCOL 3350 17 G PO PACK: 17.0000 g | PACK | Freq: Two times a day (BID) | ORAL | 0 refills | Status: DC

## 2022-12-03 MED ORDER — SENNA 8.6 MG PO TABS: 2.0000 | ORAL_TABLET | Freq: Every day | ORAL | 0 refills | Status: AC

## 2022-12-03 NOTE — TOC Transition Note (Signed)
Transition of Care [facility]) - CM/SW Discharge Note  Patient Details  Name: Sarah Valdez MRN: [id number] Date of Birth: 27-Apr-1946  Transition of Care [facility]) CM/SW Contact:  Ewing Schlein, LCSW Phone Number: 12/03/2022, 10:55 AM  Clinical Narrative: Patient is expected to discharge home after working with PT. CSW met with patient to confirm discharge plan. Patient will go home with a home exercise program (HEP). Patient has a rolling walker at home and will follow up with her husband about either borrowing a 3N1 or buying one online as it will not be covered by insurance. TOC signing off.  Final next level of care: Home/Self Care Barriers to Discharge: No Barriers Identified  Patient Goals and CMS Choice Choice offered to / list presented to : NA  Discharge Plan and Services Additional resources added to the After Visit Summary for      DME Arranged: N/A DME Agency: NA  Social Determinants of Health (SDOH) Interventions SDOH Screenings   Food Insecurity: No Food Insecurity (12/02/2022)  Housing: Low Risk  (12/02/2022)  Transportation Needs: No Transportation Needs (12/02/2022)  Utilities: Not At Risk (12/02/2022)  Tobacco Use: Low Risk  (12/02/2022)   Readmission Risk Interventions     No data to display

## 2022-12-03 NOTE — Progress Notes (Addendum)
Physical Therapy Treatment Patient Details Name: Sarah Valdez MRN: [id number] DOB: Apr 07, 1946 Today's Date: 12/03/2022   History of Present Illness 77 yo female presents to therapy s/p L THA, anterior approach on 12/02/2022 due to failure of conservative measures. [name] includes but is not limited to: B PE, tachycardia, HTN, CHF, CVA, HOH, HDL,  and R TKA (2007) with subsequent manipulation.    PT Comments    Pt is POD # 1 and is progressing well.  She was able to ambulate 120'x2 and performed stairs similar to home set up. She had good pain control and tolerated exercises well.  Pt with good safety awareness and understanding progression of activity and HEP.  Pt demonstrates safe gait & transfers in order to return home from PT perspective once discharged by MD.  While in hospital, will continue to benefit from PT for skilled therapy to advance mobility and exercises.      BP during session: 98/66 sitting, 101/68 standing, 110/82 recliner post walk; no lightheadedness    Recommendations for follow up therapy are one component of a multi-disciplinary discharge planning process, led by the attending physician.  Recommendations may be updated based on patient status, additional functional criteria and insurance authorization.  Follow Up Recommendations       Assistance Recommended at Discharge Intermittent Supervision/Assistance  Patient can return home with the following A little help with walking and/or transfers;A little help with bathing/dressing/bathroom;Assistance with cooking/housework;Assist for transportation;Help with stairs or ramp for entrance   Equipment Recommendations  None recommended by PT    Recommendations for Other Services       Precautions / Restrictions Precautions Precautions: Fall Restrictions Weight Bearing Restrictions: Yes LLE Weight Bearing: Weight bearing as tolerated     Mobility  Bed Mobility Overal bed mobility: Needs Assistance Bed Mobility:  Supine to Sit     Supine to sit: Supervision          Transfers Overall transfer level: Needs assistance Equipment used: Rolling walker (2 wheels) Transfers: Sit to/from Stand Sit to Stand: Supervision, Min guard           General transfer comment: Performed x 3 during session; initial cues for hand placement and min guard.   Progressed to supervision    Ambulation/Gait Ambulation/Gait assistance: Min guard, Supervision Gait Distance (Feet): 120 Feet (120x2) Assistive device: Rolling walker (2 wheels) Gait Pattern/deviations: Step-through pattern, Decreased weight shift to left Gait velocity: decreased     General Gait Details: initial cues for sequence and RW proximity; progressed to supervision   Stairs Stairs: Yes Stairs assistance: Min guard Stair Management: Step to pattern, Backwards Number of Stairs: 6 General stair comments: started with bil rails and progressed to 1 rial and cane to simulate home environment.  Educated on Nurse, mental health Rankin (Stroke Patients Only)       Balance Overall balance assessment: Needs assistance Sitting-balance support: Feet supported Sitting balance-Leahy Scale: Good     Standing balance support: Bilateral upper extremity supported, During functional activity, Reliant on assistive device for balance, No upper extremity supported Standing balance-Leahy Scale: Fair Standing balance comment: RW to ambulate but could static stand without support                            Cognition Arousal/Alertness: Awake/alert Behavior During Therapy: WFL for tasks assessed/performed Overall Cognitive Status: Within Functional Limits for tasks assessed  Exercises Total Joint Exercises Ankle Circles/Pumps: AROM, Both, 10 reps, Supine Quad Sets: AROM, Both, 10 reps, Supine Heel Slides: AAROM, Left, 10 reps, Supine Hip  ABduction/ADduction: AAROM, Left, 10 reps, Supine Long Arc Quad: AROM, Left, 10 reps, Seated Other Exercises Other Exercises: Educated on AAROM techniques with gait belt and exercises to tolerance    General Comments General comments (skin integrity, edema, etc.): VSS Educated on safe ice use, no pivots, car transfers,. Also, encouraged walking every 1-2 hours during day. Educated on HEP with focus on mobility the first weeks. Discussed doing exercises within pain control and if pain increasing could decreased ROM, reps, and stop exercises as needed. Encouraged to perform  ankle pumps frequently for blood flow      Pertinent Vitals/Pain Pain Assessment Pain Assessment: 0-10 Pain Score: 5  Pain Location: L hip Pain Descriptors / Indicators: Discomfort, Sore Pain Intervention(s): Limited activity within patient's tolerance, Monitored during session, Premedicated before session    Home Living                          Prior Function            PT Goals (current goals can now be found in the care plan section) Progress towards PT goals: Progressing toward goals    Frequency    7X/week      PT Plan Current plan remains appropriate    Co-evaluation              AM-PAC PT "6 Clicks" Mobility   Outcome Measure  Help needed turning from your back to your side while in a flat bed without using bedrails?: None Help needed moving from lying on your back to sitting on the side of a flat bed without using bedrails?: A Little Help needed moving to and from a bed to a chair (including a wheelchair)?: A Little Help needed standing up from a chair using your arms (e.g., wheelchair or bedside chair)?: A Little Help needed to walk in hospital room?: A Little Help needed climbing 3-5 steps with a railing? : A Little 6 Click Score: 19    End of Session Equipment Utilized During Treatment: Gait belt Activity Tolerance: Patient tolerated treatment well Patient left: with  call bell/phone within reach;with chair alarm set;in chair Nurse Communication: Mobility status PT Visit Diagnosis: Unsteadiness on feet (R26.81);Other abnormalities of gait and mobility (R26.89);Repeated falls (R29.6);Muscle weakness (generalized) (M62.81);History of falling (Z91.81);Difficulty in walking, not elsewhere classified (R26.2);Pain Pain - Right/Left: Left Pain - part of body: Hip     Time: 3734-2876 PT Time Calculation (min) (ACUTE ONLY): 37 min  Charges:  $Gait Training: 8-22 mins $Therapeutic Exercise: 8-22 mins                     Sarah Valdez, PT Acute Rehab [facility] [phone]    Sarah Valdez 12/03/2022, 1:31 PM

## 2022-12-03 NOTE — Progress Notes (Signed)
Subjective: 1 Day Post-Op Procedure(s) (LRB): TOTAL HIP ARTHROPLASTY ANTERIOR APPROACH (Left) Patient reports pain as mild.   Patient seen in rounds for Dr. Charlann Boxerlin. Patient is resting in bed on exam this morning. No acute events overnight. Foley catheter removed. Patient ambulated 24 feet with PT yesterday. We will continue therapy today.   Objective: Vital signs in last 24 hours: Temp:  [97.4 F (36.3 C)-98.9 F (37.2 C)] 98.9 F (37.2 C) (04/10 0501) Pulse Rate:  [73-91] 84 (04/10 0501) Resp:  [11-20] 17 (04/10 0501) BP: (105-155)/(58-97) 105/58 (04/10 0501) SpO2:  [87 %-100 %] 94 % (04/10 0501) Weight:  [78.5 kg] 78.5 kg (04/09 1616)  Intake/Output from previous day:  Intake/Output Summary (Last 24 hours) at 12/03/2022 0825 Last data filed at 12/03/2022 0640 Gross per 24 hour  Intake 3027.74 ml  Output 1875 ml  Net 1152.74 ml     Intake/Output this shift: No intake/output data recorded.  Labs: Recent Labs    12/03/22 0340  HGB 9.9*   Recent Labs    12/03/22 0340  WBC 13.4*  RBC 3.04*  HCT 30.3*  PLT 157   Recent Labs    12/03/22 0340  NA 135  K 3.7  CL 104  CO2 21*  BUN 16  CREATININE 0.93  GLUCOSE 174*  CALCIUM 7.9*   No results for input(s): "LABPT", "INR" in the last 72 hours.  Exam: General - Patient is Alert and Oriented Extremity - Neurologically intact Sensation intact distally Intact pulses distally Dorsiflexion/Plantar flexion intact Dressing - dressing C/D/I Motor Function - intact, moving foot and toes well on exam.   Past Medical History:  Diagnosis Date   Allergy    Bilateral sensorineural hearing loss 11/06/2016   Cancer    skin cancer - on back, forehead   Chronic congestive heart failure 02/22/2018   Cryptogenic stroke    followed by Dr Lucia GaskinsAhern   Diverticulosis of colon 2007   Dr Juanda ChanceBrodie   Dysrhythmia    Gallstones 01/20/2014   12/2013 multiple gallstones, largest 1.7 cm in diameter; asymptomatic    History of basal  cell carcinoma excision    X3   History of colon polyps    NON-CANCEROUS   History of kidney stones    Hyperlipidemia    OA (osteoarthritis of spine)    Palpitations 11/24/2007   Qualifier: Diagnosis of  By: Alwyn RenHopper MD, William     Pancreatic cyst 01/20/2014   01/03/14 8 x 11 mm nonaggressive cystic lesion the pancreas. Followup recommended in one year.    PONV (postoperative nausea and vomiting)    PSVT (paroxysmal supraventricular tachycardia)    adenosine sensitive SVT previously documented   Pulmonary embolism    Right ureteral stone    Urgency of urination    Wears glasses     Assessment/Plan: 1 Day Post-Op Procedure(s) (LRB): TOTAL HIP ARTHROPLASTY ANTERIOR APPROACH (Left) Principal Problem:   S/P total left hip arthroplasty  Estimated body mass index is 27.11 kg/m as calculated from the following:   Height as of this encounter: 5\' 7"  (1.702 m).   Weight as of this encounter: 78.5 kg. Advance diet Up with therapy D/C IV fluids  DVT Prophylaxis -  Eliquis Weight bearing as tolerated.  Hgb stable at 9.9 this AM. So far, she has not had nausea with hydromorphone.   Plan is to go Home after hospital stay. Plan for discharge today after meeting goals with therapy. Follow up in the office in 2 weeks.  Dennie Bible, PA-C Orthopedic Surgery [phone] 12/03/2022, 8:25 AM

## 2022-12-03 NOTE — Progress Notes (Signed)
RN messaged Rosalene Billings, PA to make aware of BP 98/66, P 80 and to clarify if cardizem should be given. Per Morrie Sheldon give cardizem, Dilaudid and bolus.

## 2022-12-11 NOTE — Discharge Summary (Signed)
Patient ID: KATALIN COLLEDGE MRN: [id number] DOB/AGE: [birth year] 77 y.o.  Admit date: 12/02/2022 Discharge date: 12/03/2022  Admission Diagnoses:  Left hip osteoarthritis  Discharge Diagnoses:  Principal Problem:   S/P total left hip arthroplasty   Past Medical History:  Diagnosis Date   Allergy    Bilateral sensorineural hearing loss 11/06/2016   Cancer    skin cancer - on back, forehead   Chronic congestive heart failure 02/22/2018   Cryptogenic stroke    followed by Dr Lucia Gaskins   Diverticulosis of colon 2007   Dr Juanda Chance   Dysrhythmia    Gallstones 01/20/2014   12/2013 multiple gallstones, largest 1.7 cm in diameter; asymptomatic    History of basal cell carcinoma excision    X3   History of colon polyps    NON-CANCEROUS   History of kidney stones    Hyperlipidemia    OA (osteoarthritis of spine)    Palpitations 11/24/2007   Qualifier: Diagnosis of  By: Alwyn Ren MD, William     Pancreatic cyst 01/20/2014   01/03/14 8 x 11 mm nonaggressive cystic lesion the pancreas. Followup recommended in one year.    PONV (postoperative nausea and vomiting)    PSVT (paroxysmal supraventricular tachycardia)    adenosine sensitive SVT previously documented   Pulmonary embolism    Right ureteral stone    Urgency of urination    Wears glasses     Surgeries: Procedure(s): TOTAL HIP ARTHROPLASTY ANTERIOR APPROACH on 12/02/2022   Consultants:   Discharged Condition: Improved  Hospital Course: ARRION BURRUEL is an 77 y.o. female who was admitted 12/02/2022 for operative treatment ofS/P total left hip arthroplasty. Patient has severe unremitting pain that affects sleep, daily activities, and work/hobbies. After pre-op clearance the patient was taken to the operating room on 12/02/2022 and underwent  Procedure(s): TOTAL HIP ARTHROPLASTY ANTERIOR APPROACH.    Patient was given perioperative antibiotics:  Anti-infectives (From admission, onward)    Start     Dose/Rate Route Frequency Ordered Stop    12/02/22 1800  ceFAZolin (ANCEF) IVPB 2g/100 mL premix        2 g 200 mL/hr over 30 Minutes Intravenous Every 6 hours 12/02/22 1610 12/03/22 0025   12/02/22 0930  ceFAZolin (ANCEF) IVPB 2g/100 mL premix        2 g 200 mL/hr over 30 Minutes Intravenous On call to O.R. 12/02/22 0917 12/02/22 1123        Patient was given sequential compression devices, early ambulation, and chemoprophylaxis to prevent DVT. Patient worked with PT and was meeting their goals regarding safe ambulation and transfers.  Patient benefited maximally from hospital stay and there were no complications.    Recent vital signs: No data found.   Recent laboratory studies: No results for input(s): "WBC", "HGB", "HCT", "PLT", "NA", "K", "CL", "CO2", "BUN", "CREATININE", "GLUCOSE", "INR", "CALCIUM" in the last 72 hours.  Invalid input(s): "PT", "2"   Discharge Medications:   Allergies as of 12/03/2022       Reactions   Morphine Other (See Comments)   Respiratory compromise post op   Penicillamine Itching   Penicillins Hives   Tolerates Ancef 12/02/22   Levofloxacin Other (See Comments)   SHOULDER PAIN        Medication List     STOP taking these medications    alendronate 70 MG tablet Commonly known as: FOSAMAX   traMADol 50 MG tablet Commonly known as: ULTRAM       TAKE these medications  acetaminophen 650 MG CR tablet Commonly known as: TYLENOL Take 1,300 mg by mouth every 8 (eight) hours as needed for pain.   apixaban 5 MG Tabs tablet Commonly known as: ELIQUIS Take 5 mg by mouth in the morning and at bedtime.   diltiazem 120 MG tablet Commonly known as: CARDIZEM TAKE ONE TABLET BY MOUTH DAILY   diphenhydramine-acetaminophen 25-500 MG Tabs tablet Commonly known as: TYLENOL PM Take 1 tablet by mouth at bedtime.   EYE DROPS OP Place 1 drop into both eyes as needed (redness/irritation).   ferrous sulfate 325 (65 FE) MG tablet Take 1 tablet (325 mg total) by mouth 3 (three) times  daily with meals for 14 days. Take for two weeks as tolerated.   fluticasone 50 MCG/ACT nasal spray Commonly known as: FLONASE Place 1 spray into both nostrils daily.   HYDROmorphone 2 MG tablet Commonly known as: DILAUDID Take 0.5-1 tablets (1-2 mg total) by mouth every 4 (four) hours as needed for severe pain.   methocarbamol 500 MG tablet Commonly known as: ROBAXIN Take 1 tablet (500 mg total) by mouth every 6 (six) hours as needed for muscle spasms.   ondansetron 4 MG tablet Commonly known as: ZOFRAN Take 1 tablet (4 mg total) by mouth every 6 (six) hours as needed for nausea.   polyethylene glycol 17 g packet Commonly known as: MIRALAX / GLYCOLAX Take 17 g by mouth 2 (two) times daily.   pravastatin 40 MG tablet Commonly known as: PRAVACHOL Take 40 mg by mouth daily.   predniSONE 5 MG tablet Commonly known as: DELTASONE Take 15 mg by mouth daily with breakfast.   senna 8.6 MG Tabs tablet Commonly known as: SENOKOT Take 2 tablets (17.2 mg total) by mouth at bedtime for 14 days.   TUMS PO Take 1 tablet by mouth as needed (Heartburn).   Vitamin D 125 MCG (5000 UT) Caps Take 5,000 Units by mouth daily.               Discharge Care Instructions  (From admission, onward)           Start     Ordered   12/03/22 0000  Change dressing       Comments: Maintain surgical dressing until follow up in the clinic. If the edges start to pull up, may reinforce with tape. If the dressing is no longer working, may remove and cover with gauze and tape, but must keep the area dry and clean.  Call with any questions or concerns.   12/03/22 0828            Diagnostic Studies: DG Pelvis Portable  Result Date: 12/02/2022 CLINICAL DATA:  Status post hip arthroplasty. EXAM: PORTABLE PELVIS 1-2 VIEWS COMPARISON:  None Available. FINDINGS: Left hip arthroplasty in expected alignment. No periprosthetic lucency or fracture. Recent postsurgical change includes air and edema in  the soft tissues. IMPRESSION: Left hip arthroplasty without immediate postoperative complication. Electronically Signed   By: Narda Rutherford M.D.   On: 12/02/2022 14:39   DG HIP UNILAT WITH PELVIS 1V LEFT  Result Date: 12/02/2022 CLINICAL DATA:  [id number] Surgery, elective [id number] EXAM: DG HIP (WITH OR WITHOUT PELVIS) 1V*L* COMPARISON:  None Available. FINDINGS: Intraoperative images during left hip arthroplasty. Normal alignment. IMPRESSION: Intraoperative images during left hip arthroplasty. Normal alignment. Electronically Signed   By: Caprice Renshaw M.D.   On: 12/02/2022 13:09   DG C-Arm 1-60 Min-No Report  Result Date: 12/02/2022 Fluoroscopy was utilized by the requesting physician.  No radiographic interpretation.   DG C-Arm 1-60 Min-No Report  Result Date: 12/02/2022 Fluoroscopy was utilized by the requesting physician.  No radiographic interpretation.   MM 3D SCREEN BREAST BILATERAL  Result Date: 11/13/2022 CLINICAL DATA:  Screening. EXAM: DIGITAL SCREENING BILATERAL MAMMOGRAM WITH TOMOSYNTHESIS AND CAD TECHNIQUE: Bilateral screening digital craniocaudal and mediolateral oblique mammograms were obtained. Bilateral screening digital breast tomosynthesis was performed. The images were evaluated with computer-aided detection. COMPARISON:  Previous exam(s). ACR Breast Density Category b: There are scattered areas of fibroglandular density. FINDINGS: There are no findings suspicious for malignancy. IMPRESSION: No mammographic evidence of malignancy. A result letter of this screening mammogram will be mailed directly to the patient. RECOMMENDATION: Screening mammogram in one year. (Code:SM-B-01Y) BI-RADS CATEGORY  1: Negative. Electronically Signed   By: Elberta Fortis M.D.   On: 11/13/2022 09:04    Disposition: Discharge disposition: 01-Home or Self Care       Discharge Instructions     Call MD / Call 911   Complete by: As directed    If you experience chest pain or shortness of breath, CALL  911 and be transported to the hospital emergency room.  If you develope a fever above 101 F, pus (white drainage) or increased drainage or redness at the wound, or calf pain, call your surgeon's office.   Change dressing   Complete by: As directed    Maintain surgical dressing until follow up in the clinic. If the edges start to pull up, may reinforce with tape. If the dressing is no longer working, may remove and cover with gauze and tape, but must keep the area dry and clean.  Call with any questions or concerns.   Constipation Prevention   Complete by: As directed    Drink plenty of fluids.  Prune juice may be helpful.  You may use a stool softener, such as Colace (over the counter) 100 mg twice a day.  Use MiraLax (over the counter) for constipation as needed.   Diet - low sodium heart healthy   Complete by: As directed    Increase activity slowly as tolerated   Complete by: As directed    Weight bearing as tolerated with assist device (walker, cane, etc) as directed, use it as long as suggested by your surgeon or therapist, typically at least 4-6 weeks.   Post-operative opioid taper instructions:   Complete by: As directed    POST-OPERATIVE OPIOID TAPER INSTRUCTIONS: It is important to wean off of your opioid medication as soon as possible. If you do not need pain medication after your surgery it is ok to stop day one. Opioids include: Codeine, Hydrocodone(Norco, Vicodin), Oxycodone(Percocet, oxycontin) and hydromorphone amongst others.  Long term and even short term use of opiods can cause: Increased pain response Dependence Constipation Depression Respiratory depression And more.  Withdrawal symptoms can include Flu like symptoms Nausea, vomiting And more Techniques to manage these symptoms Hydrate well Eat regular healthy meals Stay active Use relaxation techniques(deep breathing, meditating, yoga) Do Not substitute Alcohol to help with tapering If you have been on opioids  for less than two weeks and do not have pain than it is ok to stop all together.  Plan to wean off of opioids This plan should start within one week post op of your joint replacement. Maintain the same interval or time between taking each dose and first decrease the dose.  Cut the total daily intake of opioids by one tablet each day Next start to increase the  time between doses. The last dose that should be eliminated is the evening dose.      TED hose   Complete by: As directed    Use stockings (TED hose) for 2 weeks on both leg(s).  You may remove them at night for sleeping.        Follow-up Information     Cassandria Anger, PA-C. Go on 12/17/2022.   Specialty: Orthopedic Surgery Why: You are scheduled for first post op appt on Wednesday April 24 at 10:30am. Contact information: [address] [place] [address] [phone]                  Signed: Cassandria Anger 12/11/2022, 7:13 AM

## 2022-12-15 DIAGNOSIS — Z4731 Aftercare following explantation of shoulder joint prosthesis: Secondary | ICD-10-CM | POA: Diagnosis not present

## 2022-12-17 IMAGING — CT CT ANGIO CHEST
2 of 6 series · 16 of 36 positions shown · IV contrast (iopamidol)
Comparison: Chest x-ray 11/12/2009

CLINICAL DATA: Shortness of breath, elevated D-dimer

EXAM:
CT ANGIOGRAPHY CHEST WITH CONTRAST
TECHNIQUE: Multidetector CT imaging of the chest was performed using the
standard protocol during bolus administration of intravenous
contrast. Multiplanar CT image reconstructions and MIPs were
obtained to evaluate the vascular anatomy.
CONTRAST:  75mL 1RGQIZ-HYM IOPAMIDOL (1RGQIZ-HYM) INJECTION 76%

[Series 10: cta pulmonary 1.00 bv36 s3 thins · axial · 0.74mm/px · z∈[+1511,+1745]mm · 7 of 314 slices shown]
[im 40/314  lung]
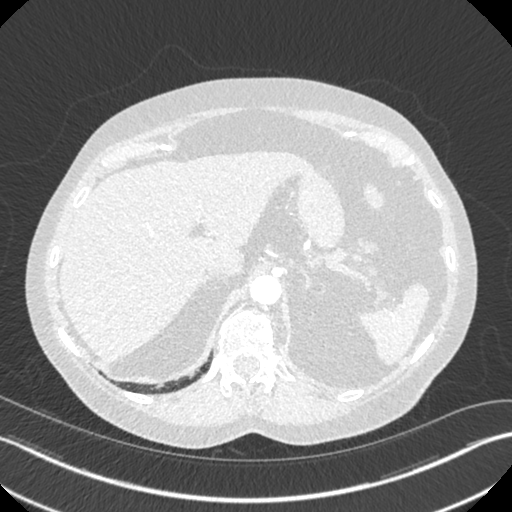
[im 79/314  lung]
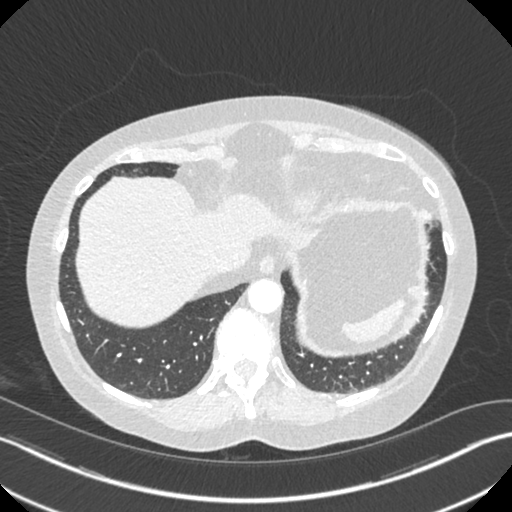
[im 118/314  lung]
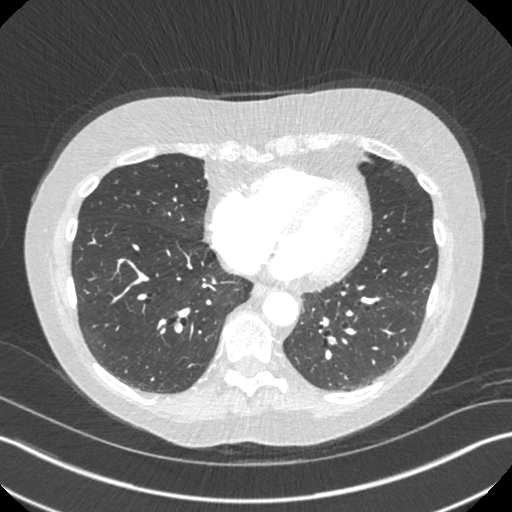
[im 157/314  lung]
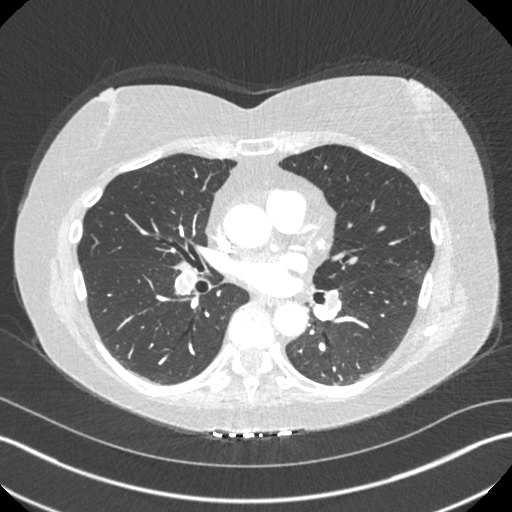
[im 196/314  lung]
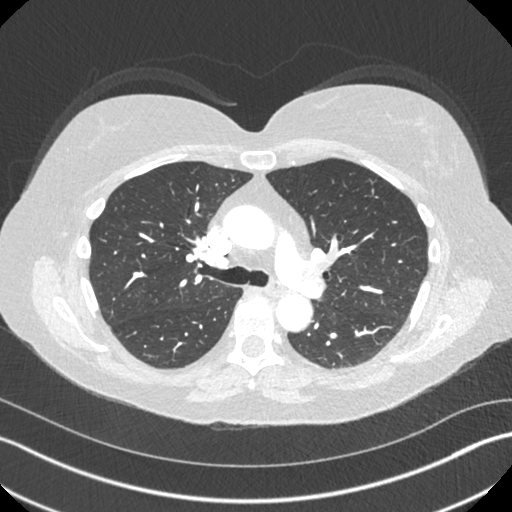
[im 235/314  lung]
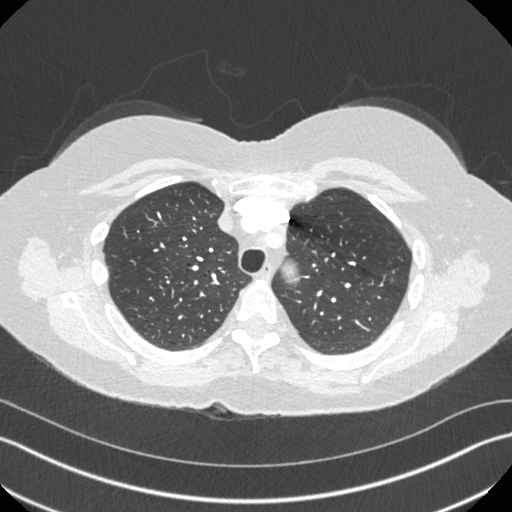
[im 274/314  lung]
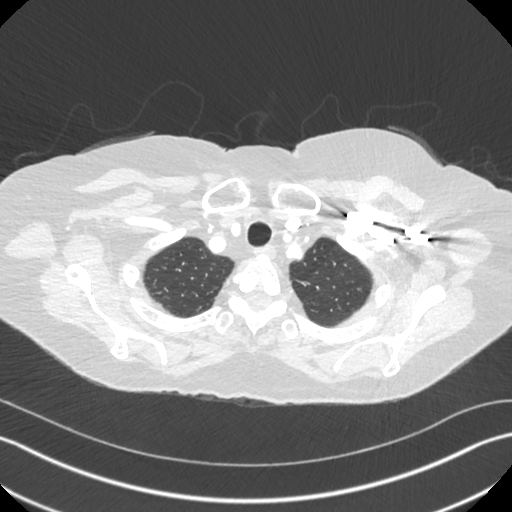

[Series 11: cta pulmonary 1.00 bv36 s3 super d. · axial · 0.74mm/px · z∈[+1503,+1753]mm · 9 of 392 slices shown]
[im 40/392  lung]
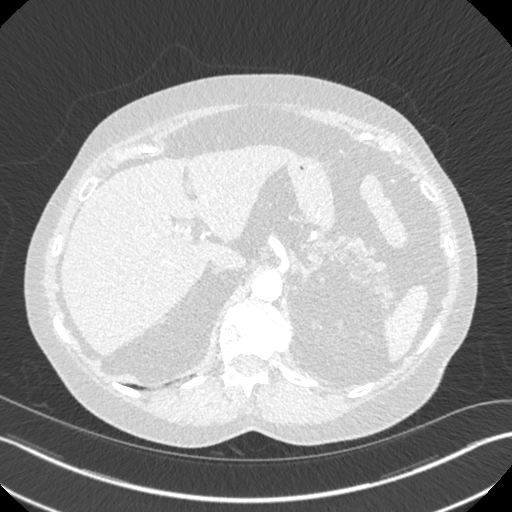
[im 79/392  mediastinal]
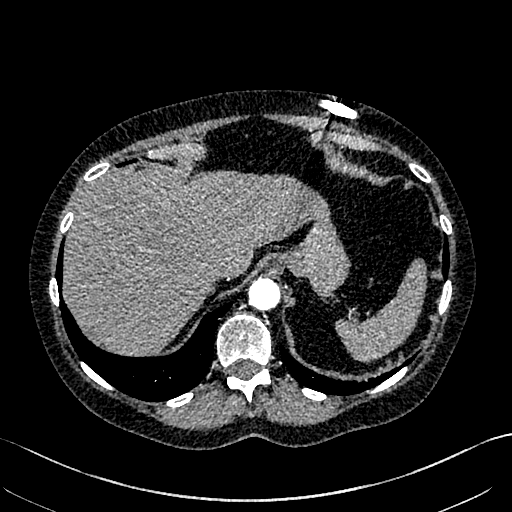
[im 118/392  lung]
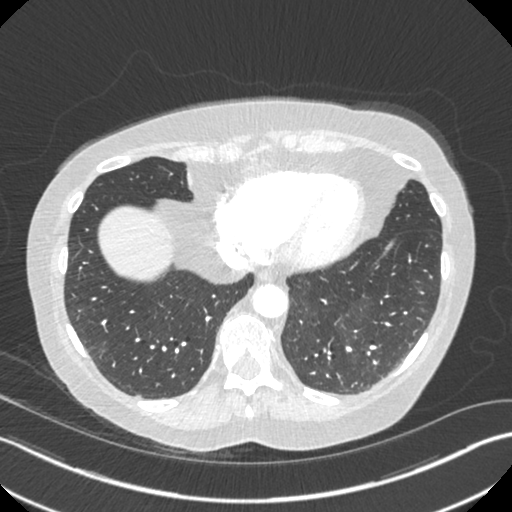
[im 157/392  mediastinal]
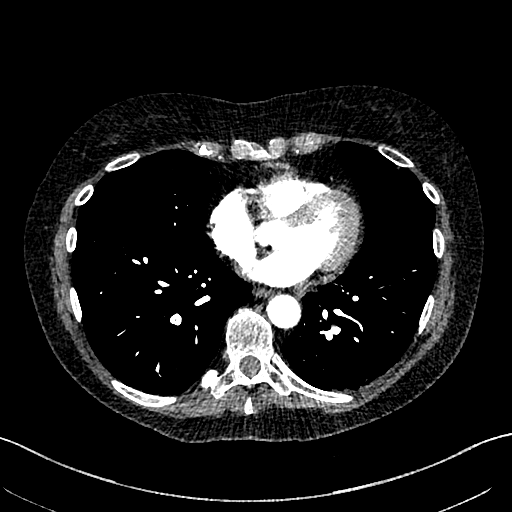
[im 196/392  lung]
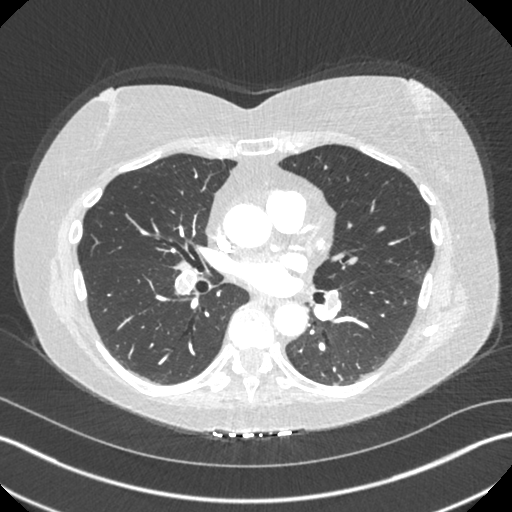
[im 235/392  mediastinal]
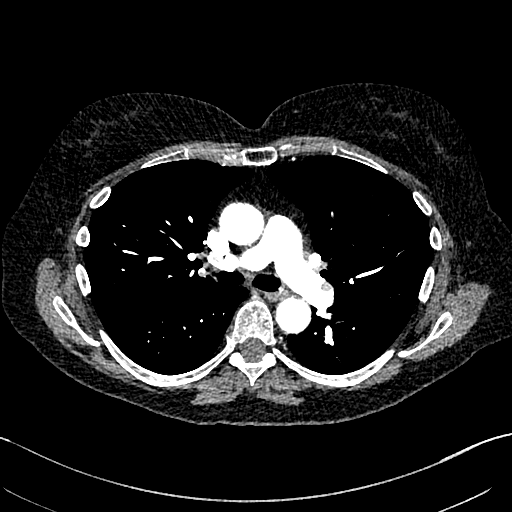
[im 274/392  lung]
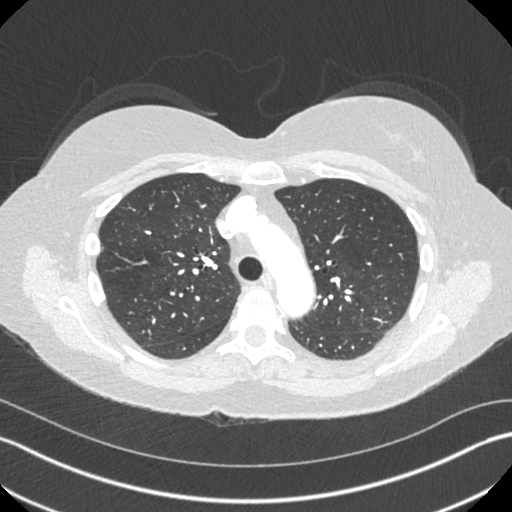
[im 313/392  mediastinal]
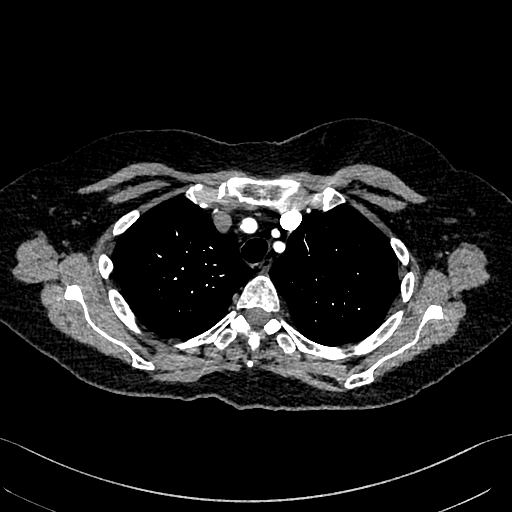
[im 352/392  lung]
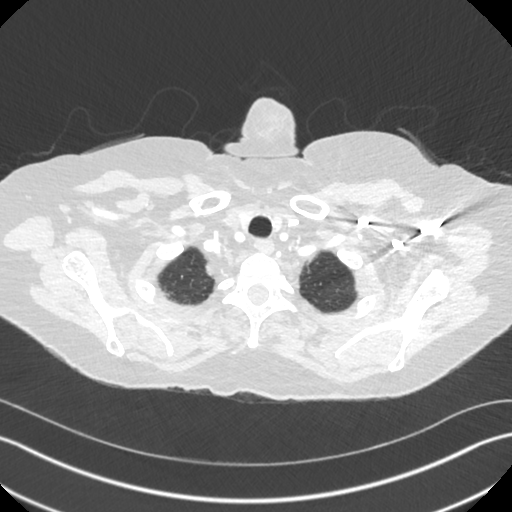

[16 of 36 positions shown; findings below may reference images not displayed]

FINDINGS: Cardiovascular: Satisfactory opacification of the pulmonary
arteries. Extensive bilateral pulmonary emboli with thrombus present
within both the right and left main pulmonary artery with extension
into the lobar branches and multiple segmental branches. No saddle
embolism. RV to LV ratio of 1.4 indicative of right heart strain.
Heart size is normal. No pericardial effusion. Thoracic aorta is
nonaneurysmal. Three vessel arch.

Mediastinum/Nodes: No enlarged mediastinal, hilar, or axillary lymph
nodes. Thyroid gland, trachea, and esophagus demonstrate no
significant findings.

Lungs/Pleura: Small wedge-shaped opacity in the periphery of the
left upper lobe anteriorly (series 12, image 92) which may reflect
an area of pulmonary infarction. Elsewhere, lungs are clear. No
pleural effusion or pneumothorax.

Upper Abdomen: Suspect cholelithiasis within the partially imaged
gallbladder.

Musculoskeletal: Lower left chest wall implanted loop recorder. Left
worse than right glenohumeral joint osteoarthritis. Advanced
degenerative disc disease of T12-L1. No acute osseous findings.

Review of the MIP images confirms the above findings.
IMPRESSION: 1. Extensive bilateral pulmonary emboli with evidence of right heart
strain (RV/LV Ratio 1.4) consistent with at least submassive
(intermediate risk) PE. The presence of right heart strain has been
associated with an increased risk of morbidity and mortality.
2. Small wedge-shaped opacity in the periphery of the left upper
lobe anteriorly may reflect an area of pulmonary infarction.
3. Suspect cholelithiasis.

Critical Value/emergent results were called by telephone at the time
of interpretation on 10/11/2020 at [DATE] to provider Dr Rabbinson
Lilgman, who verbally acknowledged these results.

## 2023-01-07 DIAGNOSIS — Z4731 Aftercare following explantation of shoulder joint prosthesis: Secondary | ICD-10-CM | POA: Diagnosis not present

## 2023-02-02 ENCOUNTER — Ambulatory Visit (HOSPITAL_COMMUNITY)
Admission: RE | Admit: 2023-02-02 | Discharge: 2023-02-02 | Disposition: A | Discharge status: Home / Self Care | Payer: Medicare Other | Source: Ambulatory Visit | Attending: Internal Medicine | Admitting: Internal Medicine

## 2023-02-02 ENCOUNTER — Other Ambulatory Visit (HOSPITAL_COMMUNITY): Payer: Self-pay | Admitting: Adult Health

## 2023-02-02 DIAGNOSIS — R600 Localized edema: Secondary | ICD-10-CM

## 2023-02-02 DIAGNOSIS — Z86718 Personal history of other venous thrombosis and embolism: Secondary | ICD-10-CM | POA: Insufficient documentation

## 2023-02-02 DIAGNOSIS — M79604 Pain in right leg: Secondary | ICD-10-CM

## 2023-02-24 DIAGNOSIS — D692 Other nonthrombocytopenic purpura: Secondary | ICD-10-CM | POA: Diagnosis not present

## 2023-02-24 DIAGNOSIS — R600 Localized edema: Secondary | ICD-10-CM | POA: Diagnosis not present

## 2023-02-24 DIAGNOSIS — D6859 Other primary thrombophilia: Secondary | ICD-10-CM | POA: Diagnosis not present

## 2023-02-24 DIAGNOSIS — Z7901 Long term (current) use of anticoagulants: Secondary | ICD-10-CM | POA: Diagnosis not present

## 2023-02-24 DIAGNOSIS — Z86718 Personal history of other venous thrombosis and embolism: Secondary | ICD-10-CM | POA: Diagnosis not present

## 2023-02-24 DIAGNOSIS — I2699 Other pulmonary embolism without acute cor pulmonale: Secondary | ICD-10-CM | POA: Diagnosis not present

## 2023-02-24 DIAGNOSIS — I878 Other specified disorders of veins: Secondary | ICD-10-CM | POA: Diagnosis not present

## 2023-02-24 DIAGNOSIS — R002 Palpitations: Secondary | ICD-10-CM | POA: Diagnosis not present

## 2023-02-24 DIAGNOSIS — M79604 Pain in right leg: Secondary | ICD-10-CM | POA: Diagnosis not present

## 2023-03-02 DIAGNOSIS — H2513 Age-related nuclear cataract, bilateral: Secondary | ICD-10-CM | POA: Diagnosis not present

## 2023-03-02 DIAGNOSIS — H5213 Myopia, bilateral: Secondary | ICD-10-CM | POA: Diagnosis not present

## 2023-03-02 DIAGNOSIS — H25043 Posterior subcapsular polar age-related cataract, bilateral: Secondary | ICD-10-CM | POA: Diagnosis not present

## 2023-03-02 DIAGNOSIS — H52203 Unspecified astigmatism, bilateral: Secondary | ICD-10-CM | POA: Diagnosis not present

## 2023-03-12 DIAGNOSIS — M19012 Primary osteoarthritis, left shoulder: Secondary | ICD-10-CM | POA: Diagnosis not present

## 2023-03-13 DIAGNOSIS — Z96642 Presence of left artificial hip joint: Secondary | ICD-10-CM | POA: Diagnosis not present

## 2023-03-23 DIAGNOSIS — M25552 Pain in left hip: Secondary | ICD-10-CM | POA: Diagnosis not present

## 2023-04-05 NOTE — Progress Notes (Unsigned)
Cardiology Office Note Date:  04/05/2023  Patient ID:  Sarah Valdez, Sarah Valdez 1946/01/27, MRN [id number] PCP:  Creola Corn, MD  Electrophysiologist: Dr. Johney Frame  ***refresh   Chief Complaint: *** 1 year  History of Present Illness: Sarah Valdez is a 77 y.o. female with history of SVT, recurrent PE (on OAC), stroke,  > ILR,   HLD  She saw Dr. Johney Frame 03/05/22, doing well, no changes were made, planned for annual visit  Hip surgery April this year  *** symptoms *** eliquis, bleeding, PMD *** labs, lipids... *** new EP MD   Device information MDT ILR implanted 05/21/2017 for cryptogenic stroke No AFib noted RRT Removed 10/29/20   Past Medical History:  Diagnosis Date   Allergy    Bilateral sensorineural hearing loss 11/06/2016   Cancer (HCC)    skin cancer - on back, forehead   Chronic congestive heart failure (HCC) 02/22/2018   Cryptogenic stroke (HCC)    followed by Dr Lucia Gaskins   Diverticulosis of colon 2007   Dr Juanda Chance   Dysrhythmia    Gallstones 01/20/2014   12/2013 multiple gallstones, largest 1.7 cm in diameter; asymptomatic    History of basal cell carcinoma excision    X3   History of colon polyps    NON-CANCEROUS   History of kidney stones    Hyperlipidemia    OA (osteoarthritis of spine)    Palpitations 11/24/2007   Qualifier: Diagnosis of  By: Alwyn Ren MD, William     Pancreatic cyst 01/20/2014   01/03/14 8 x 11 mm nonaggressive cystic lesion the pancreas. Followup recommended in one year.    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)   CLOSED MANIPULATION POST TOTAL  KNEE  ARTHROPLASTY  07/13/2006   COLONOSCOPY W/ POLYPECTOMY  2002   adenomatous; Dr Juanda Chance   epidural steroid injections     two rounds of injections; lumbar spine    EXTRACORPOREAL SHOCK WAVE LITHOTRIPSY  X3   FACELIFT W/BLEPHAROPLASTY  2005   HYSTEROSCOPY WITH D & C  12/22/2000   implantable loop recorder removal  10/29/2020   MDT Reveal LINQ removed by Dr Johney Frame   LOOP RECORDER INSERTION N/A 05/21/2017   Procedure: LOOP RECORDER INSERTION;  Surgeon: Hillis Range, MD;  Location: [facility];  Service: Cardiovascular;  Laterality: N/A;   OPEN KNEE MENISECTOMY  1968   RIGHT URETEROSCOPIC STONE EXTRACTION / STENT PLACEMENT  08/21/2008   rotator cuff on right shoulder     TONSILLECTOMY  1950   TOTAL HIP ARTHROPLASTY Left 12/02/2022   Procedure: TOTAL HIP ARTHROPLASTY ANTERIOR APPROACH;  Surgeon: Durene Romans, MD;  Location: [facility];  Service: Orthopedics;  Laterality: Left;   TOTAL KNEE ARTHROPLASTY Right 05/30/2006   TUBAL LIGATION     WISDOM TOOTH EXTRACTION      Current Outpatient Medications  Medication Sig Dispense Refill   acetaminophen (TYLENOL) 650 MG CR tablet Take 1,300 mg by mouth every 8 (eight) hours as needed for pain.     apixaban (ELIQUIS) 5 MG TABS tablet Take 5 mg by mouth in the  morning and at bedtime.     Calcium Carbonate Antacid (TUMS PO) Take 1 tablet by mouth as needed (Heartburn).     Carboxymethylcellulose Sodium (EYE DROPS OP) Place 1 drop into both eyes as needed (redness/irritation).     Cholecalciferol (VITAMIN D) 125 MCG (5000 UT) CAPS Take 5,000 Units by mouth daily.     diltiazem (CARDIZEM) 120 MG tablet TAKE ONE TABLET BY MOUTH DAILY (Patient taking differently: Take 120 mg by mouth daily.) 30 tablet 0   diphenhydramine-acetaminophen (TYLENOL PM) 25-500 MG TABS tablet Take 1 tablet by mouth at bedtime.     ferrous sulfate 325 (65 FE) MG tablet Take 1 tablet (325 mg total) by mouth 3 (three) times daily with meals for 14 days. Take for two weeks as  tolerated.     fluticasone (FLONASE) 50 MCG/ACT nasal spray Place 1 spray into both nostrils daily.     HYDROmorphone (DILAUDID) 2 MG tablet Take 0.5-1 tablets (1-2 mg total) by mouth every 4 (four) hours as needed for severe pain. 42 tablet 0   methocarbamol (ROBAXIN) 500 MG tablet Take 1 tablet (500 mg total) by mouth every 6 (six) hours as needed for muscle spasms. 40 tablet 2   ondansetron (ZOFRAN) 4 MG tablet Take 1 tablet (4 mg total) by mouth every 6 (six) hours as needed for nausea. 20 tablet 0   polyethylene glycol (MIRALAX / GLYCOLAX) 17 g packet Take 17 g by mouth 2 (two) times daily. 14 each 0   pravastatin (PRAVACHOL) 40 MG tablet Take 40 mg by mouth daily.     predniSONE (DELTASONE) 5 MG tablet Take 15 mg by mouth daily with breakfast.     No current facility-administered medications for this visit.    Allergies:   Morphine, Penicillamine, Penicillins, and Levofloxacin   Social History:  The patient  reports that she has never smoked. She has never used smokeless tobacco. She reports current alcohol use of about 5.0 standard drinks of alcohol per week. She reports that she does not use drugs.   Family History:  The patient's family history includes Breast cancer in her sister; Cancer in her paternal grandfather; Colon polyps in her father; Emphysema in her father; Esophageal cancer in her paternal grandfather; Heart attack (age of onset: 73) in her mother; Heart attack (age of onset: 34) in her maternal grandfather.  ROS:  Please see the history of present illness.    All other systems are reviewed and otherwise negative.   PHYSICAL EXAM:  VS:  There were no vitals taken for this visit. BMI: There is no height or weight on file to calculate BMI. Well nourished, well developed, in no acute distress HEENT: normocephalic, atraumatic Neck: no JVD, carotid bruits or masses Cardiac:  *** RRR; no significant murmurs, no rubs, or gallops Lungs:  *** CTA b/l, no wheezing, rhonchi or  rales Abd: soft, nontender MS: no deformity or *** atrophy Ext: *** no edema Skin: warm and dry, no rash Neuro:  No gross deficits appreciated Psych: euthymic mood, full affect   EKG:  Done today and reviewed by myself shows       10/11/20: TTE 1. Left ventricular ejection fraction, by estimation, is 55 to 60%. The  left ventricle has normal function. The left ventricle has no regional  wall motion abnormalities. Left ventricular diastolic function could not  be evaluated.   2. Right ventricular systolic function is normal. The right ventricular  size is mildly enlarged. There is normal pulmonary artery  systolic  pressure.   3. There is an echodensity adjacent to the intra-atrial septum. Cannot  determine etiology of mass, but given extensive pulmonary embolism burden,  concern would be for clot. Consider reimaging in the future vs additional  imaging modality.   4. The mitral valve is grossly normal. Trivial mitral valve  regurgitation. No evidence of mitral stenosis. Moderate mitral annular  calcification.   5. The aortic valve is tricuspid. Aortic valve regurgitation is not  visualized. No aortic stenosis is present.   6. The inferior vena cava is normal in size with greater than 50%  respiratory variability, suggesting right atrial pressure of 3 mmHg.   Recent Labs: 12/03/2022: BUN 16; Creatinine, Ser 0.93; Hemoglobin 9.9; Platelets 157; Potassium 3.7; Sodium 135  No results found for requested labs within last 365 days.   CrCl cannot be calculated (Patient's most recent lab result is older than the maximum 21 days allowed.).   Wt Readings from Last 3 Encounters:  12/02/22 173 lb 1 oz (78.5 kg)  11/19/22 173 lb (78.5 kg)  03/05/22 171 lb 9.6 oz (77.8 kg)     Other studies reviewed: Additional studies/records reviewed today include: summarized above  ASSESSMENT AND PLAN:  SVT ***  Recurrent PE On Eliquis managed by her PMD ***   Disposition: F/u with  ***  Current medicines are reviewed at length with the patient today.  The patient did not have any concerns regarding medicines.  Norma Fredrickson, PA-C 04/05/2023 9:00 AM     [facility] [address] [place] [address] [phone] (office)  [phone] (fax)

## 2023-04-06 ENCOUNTER — Ambulatory Visit: Payer: Medicare HMO | Attending: Physician Assistant | Admitting: Physician Assistant

## 2023-04-06 ENCOUNTER — Encounter: Payer: Self-pay | Admitting: Physician Assistant

## 2023-04-06 VITALS — BP 136/80 | HR 78 | Ht 67.0 in | Wt 170.0 lb

## 2023-04-06 DIAGNOSIS — I471 Supraventricular tachycardia, unspecified: Secondary | ICD-10-CM | POA: Diagnosis not present

## 2023-04-06 DIAGNOSIS — I509 Heart failure, unspecified: Secondary | ICD-10-CM | POA: Diagnosis not present

## 2023-04-06 NOTE — Patient Instructions (Signed)
Medication Instructions:   Your physician recommends that you continue on your current medications as directed. Please refer to the Current Medication list given to you today.  *If you need a refill on your cardiac medications before your next appointment, please call your pharmacy*   Lab Work:  NONE ORDERED  TODAY   If you have labs (blood work) drawn today and your tests are completely normal, you will receive your results only by: MyChart Message (if you have MyChart) OR A paper copy in the mail If you have any lab test that is abnormal or we need to change your treatment, we will call you to review the results.   Testing/Procedures: .NONE ORDERED  TODAY     Follow-Up: At [facility], you and your health needs are our priority.  As part of our continuing mission to provide you with exceptional heart care, we have created designated Provider Care Teams.  These Care Teams include your primary Cardiologist (physician) and Advanced Practice Providers (APPs -  Physician Assistants and Nurse Practitioners) who all work together to provide you with the care you need, when you need it.  We recommend signing up for the patient portal called "MyChart".  Sign up information is provided on this After Visit Summary.  MyChart is used to connect with patients for Virtual Visits (Telemedicine).  Patients are able to view lab/test results, encounter notes, upcoming appointments, etc.  Non-urgent messages can be sent to your provider as well.   To learn more about what you can do with MyChart, go to [email or web address].    Your next appointment:   1 year(s)  Provider:   Cameron Lambert, MD    Other Instructions  

## 2023-04-07 DIAGNOSIS — H25812 Combined forms of age-related cataract, left eye: Secondary | ICD-10-CM | POA: Diagnosis not present

## 2023-04-07 DIAGNOSIS — Z961 Presence of intraocular lens: Secondary | ICD-10-CM | POA: Diagnosis not present

## 2023-04-07 DIAGNOSIS — H25042 Posterior subcapsular polar age-related cataract, left eye: Secondary | ICD-10-CM | POA: Diagnosis not present

## 2023-04-07 DIAGNOSIS — H2512 Age-related nuclear cataract, left eye: Secondary | ICD-10-CM | POA: Diagnosis not present

## 2023-04-17 DIAGNOSIS — I878 Other specified disorders of veins: Secondary | ICD-10-CM | POA: Diagnosis not present

## 2023-04-17 DIAGNOSIS — M79604 Pain in right leg: Secondary | ICD-10-CM | POA: Diagnosis not present

## 2023-04-17 DIAGNOSIS — R600 Localized edema: Secondary | ICD-10-CM | POA: Diagnosis not present

## 2023-04-17 DIAGNOSIS — I2699 Other pulmonary embolism without acute cor pulmonale: Secondary | ICD-10-CM | POA: Diagnosis not present

## 2023-04-17 DIAGNOSIS — Z7901 Long term (current) use of anticoagulants: Secondary | ICD-10-CM | POA: Diagnosis not present

## 2023-04-17 DIAGNOSIS — D692 Other nonthrombocytopenic purpura: Secondary | ICD-10-CM | POA: Diagnosis not present

## 2023-04-17 DIAGNOSIS — D6859 Other primary thrombophilia: Secondary | ICD-10-CM | POA: Diagnosis not present

## 2023-04-17 DIAGNOSIS — I8290 Acute embolism and thrombosis of unspecified vein: Secondary | ICD-10-CM | POA: Diagnosis not present

## 2023-04-17 DIAGNOSIS — R002 Palpitations: Secondary | ICD-10-CM | POA: Diagnosis not present

## 2023-04-21 DIAGNOSIS — Z961 Presence of intraocular lens: Secondary | ICD-10-CM | POA: Diagnosis not present

## 2023-04-21 DIAGNOSIS — H25811 Combined forms of age-related cataract, right eye: Secondary | ICD-10-CM | POA: Diagnosis not present

## 2023-04-21 DIAGNOSIS — H2511 Age-related nuclear cataract, right eye: Secondary | ICD-10-CM | POA: Diagnosis not present

## 2023-04-21 DIAGNOSIS — H25041 Posterior subcapsular polar age-related cataract, right eye: Secondary | ICD-10-CM | POA: Diagnosis not present

## 2023-04-28 DIAGNOSIS — I872 Venous insufficiency (chronic) (peripheral): Secondary | ICD-10-CM | POA: Diagnosis not present

## 2023-05-27 DIAGNOSIS — L821 Other seborrheic keratosis: Secondary | ICD-10-CM | POA: Diagnosis not present

## 2023-05-27 DIAGNOSIS — I8312 Varicose veins of left lower extremity with inflammation: Secondary | ICD-10-CM | POA: Diagnosis not present

## 2023-05-27 DIAGNOSIS — L814 Other melanin hyperpigmentation: Secondary | ICD-10-CM | POA: Diagnosis not present

## 2023-05-27 DIAGNOSIS — D225 Melanocytic nevi of trunk: Secondary | ICD-10-CM | POA: Diagnosis not present

## 2023-05-27 DIAGNOSIS — L820 Inflamed seborrheic keratosis: Secondary | ICD-10-CM | POA: Diagnosis not present

## 2023-05-27 DIAGNOSIS — L578 Other skin changes due to chronic exposure to nonionizing radiation: Secondary | ICD-10-CM | POA: Diagnosis not present

## 2023-05-27 DIAGNOSIS — I8311 Varicose veins of right lower extremity with inflammation: Secondary | ICD-10-CM | POA: Diagnosis not present

## 2023-05-27 DIAGNOSIS — D2262 Melanocytic nevi of left upper limb, including shoulder: Secondary | ICD-10-CM | POA: Diagnosis not present

## 2023-05-27 DIAGNOSIS — L570 Actinic keratosis: Secondary | ICD-10-CM | POA: Diagnosis not present

## 2023-06-19 DIAGNOSIS — Z124 Encounter for screening for malignant neoplasm of cervix: Secondary | ICD-10-CM | POA: Diagnosis not present

## 2023-06-19 DIAGNOSIS — Z6827 Body mass index (BMI) 27.0-27.9, adult: Secondary | ICD-10-CM | POA: Diagnosis not present

## 2023-06-19 DIAGNOSIS — N760 Acute vaginitis: Secondary | ICD-10-CM | POA: Diagnosis not present

## 2023-06-19 DIAGNOSIS — Z1151 Encounter for screening for human papillomavirus (HPV): Secondary | ICD-10-CM | POA: Diagnosis not present

## 2023-09-07 DIAGNOSIS — M19012 Primary osteoarthritis, left shoulder: Secondary | ICD-10-CM | POA: Diagnosis not present

## 2023-09-08 ENCOUNTER — Telehealth: Payer: Self-pay | Admitting: Nurse Practitioner

## 2023-09-08 NOTE — Telephone Encounter (Addendum)
   Pre-operative Risk Assessment    Patient Name: Sarah Valdez  DOB: March 28, 1946 MRN: [id number]   Date of last office visit: 11/04/2022   Date of next office visit: none   Request for Surgical Clearance    Procedure:   left reverse shoulder arthroplasty  Date of Surgery:  Clearance TBD                                Surgeon:  Dr. Franky Pointer Surgeon's Group or Practice Name:  [facility] Phone number:  [phone] Fax number:  [phone]   Type of Clearance Requested:   -Medical - Pharmacy:  Hold Apixaban  (Eliquis ) Need Eliquis  instructions   Type of Anesthesia:  General    Additional requests/questions:    Bonney Jonel LITTIE Smitty   09/08/2023, 4:17 PM

## 2023-09-09 NOTE — Telephone Encounter (Signed)
Pt takes Eliquis for prior PE, we have not been managing pt for this or prescribing her Eliquis. Recommend clearance come from managing provider.

## 2023-09-09 NOTE — Telephone Encounter (Signed)
 Pharmacy please advise on holding apixaban  prior to left reverse shoulder arthroplasty with Dr. Ellard Gunning on date to be determined. Thank you.

## 2023-09-10 ENCOUNTER — Telehealth: Payer: Self-pay | Admitting: Pharmacy Technician

## 2023-09-10 ENCOUNTER — Other Ambulatory Visit (HOSPITAL_COMMUNITY): Payer: Self-pay

## 2023-09-10 NOTE — Telephone Encounter (Signed)
Pharmacy Patient Advocate Encounter   Received notification from Pt Calls Messages that prior authorization for eliquis is required/requested.   Insurance verification completed.   The patient is insured through [employer] .   Per test claim: Refill too soon. PA is not needed at this time. Medication was filled 08/03/23. Next eligible fill date is 10/10/23.

## 2023-09-11 NOTE — Telephone Encounter (Signed)
Surgeon office contacted me this morning stating: I see where they said you didn't monitor her blood thinner, but are they still going to do a clearance appt? She is eager to schedule surgery so just wanted to touch base    I will send the request back to our preop team for review. Once the pt has been cleared we will be sure to fax notes. The surgeon needs medical clearance as well.

## 2023-09-11 NOTE — Telephone Encounter (Signed)
1st attempt to reach pt regarding surgical clearance and the need for an TELE appointment.  Unable to LVM as it didn't provide the option to.

## 2023-09-11 NOTE — Telephone Encounter (Signed)
   Name: Sarah Valdez  DOB: 10-09-1945  MRN: [id number]  Primary Cardiologist: None   Preoperative team, please contact this patient and set up a phone call appointment for further preoperative risk assessment. Please obtain consent and complete medication review. Thank you for your help.  I confirm that guidance regarding antiplatelet and oral anticoagulation therapy has been completed and, if necessary, noted below.  Pt takes Eliquis for prior PE, we have not been managing pt for this or prescribing her Eliquis. Recommend clearance come from managing provider.  I also confirmed the patient resides in the state of [place]. As per [facility] Medical Board telemedicine laws, the patient must reside in the state in which the provider is licensed.   Joylene Grapes, NP 09/11/2023, 9:25 AM [facility]

## 2023-09-14 ENCOUNTER — Telehealth: Payer: Self-pay

## 2023-09-14 NOTE — Telephone Encounter (Signed)
Patient called back, and set up a Tele appt on 09/18/23, Meds rec and consent done, Pre Sarah Person NP ok to add her     Patient Consent for Virtual Visit        Sarah Valdez has provided verbal consent on 09/14/2023 for a virtual visit (video or telephone).   CONSENT FOR VIRTUAL VISIT FOR:  Sarah Valdez  By participating in this virtual visit I agree to the following:  I hereby voluntarily request, consent and authorize [facility] and its employed or contracted physicians, physician assistants, nurse practitioners or other licensed health care professionals (the Practitioner), to provide me with telemedicine health care services (the "Services") as deemed necessary by the treating Practitioner. I acknowledge and consent to receive the Services by the Practitioner via telemedicine. I understand that the telemedicine visit will involve communicating with the Practitioner through live audiovisual communication technology and the disclosure of certain medical information by electronic transmission. I acknowledge that I have been given the opportunity to request an in-Valdez assessment or other available alternative prior to the telemedicine visit and am voluntarily participating in the telemedicine visit.  I understand that I have the right to withhold or withdraw my consent to the use of telemedicine in the course of my care at any time, without affecting my right to future care or treatment, and that the Practitioner or I may terminate the telemedicine visit at any time. I understand that I have the right to inspect all information obtained and/or recorded in the course of the telemedicine visit and may receive copies of available information for a reasonable fee.  I understand that some of the potential risks of receiving the Services via telemedicine include:  Delay or interruption in medical evaluation due to technological equipment failure or disruption; Information transmitted  may not be sufficient (e.g. poor resolution of images) to allow for appropriate medical decision making by the Practitioner; and/or  In rare instances, security protocols could fail, causing a breach of personal health information.  Furthermore, I acknowledge that it is my responsibility to provide information about my medical history, conditions and care that is complete and accurate to the best of my ability. I acknowledge that Practitioner's advice, recommendations, and/or decision may be based on factors not within their control, such as incomplete or inaccurate data provided by me or distortions of diagnostic images or specimens that may result from electronic transmissions. I understand that the practice of medicine is not an exact science and that Practitioner makes no warranties or guarantees regarding treatment outcomes. I acknowledge that a copy of this consent can be made available to me via my patient portal [facility] MyChart), or I can request a printed copy by calling the office of [facility].    I understand that my insurance will be billed for this visit.   I have read or had this consent read to me. I understand the contents of this consent, which adequately explains the benefits and risks of the Services being provided via telemedicine.  I have been provided ample opportunity to ask questions regarding this consent and the Services and have had my questions answered to my satisfaction. I give my informed consent for the services to be provided through the use of telemedicine in my medical care

## 2023-09-14 NOTE — Telephone Encounter (Signed)
Patient called back, and set up a Tele appt on 09/18/23, Meds rec and consent done, Pre [name] NP ok to add her

## 2023-09-14 NOTE — Telephone Encounter (Signed)
Called patient to set up an pre-op Tele appt, no answer left a vm to call back

## 2023-09-18 ENCOUNTER — Ambulatory Visit: Payer: Medicare HMO | Attending: Nurse Practitioner

## 2023-09-18 DIAGNOSIS — Z01810 Encounter for preprocedural cardiovascular examination: Secondary | ICD-10-CM

## 2023-09-18 NOTE — Progress Notes (Signed)
COVID Vaccine received:  []  No [x]  Yes Date of any COVID positive Test in last 40 days:no  PCP - Creola Corn MD Cardiologist -  Electrophys.[facility]  Cardiac clearance -Robin Searing NP- 09/18/23  Chest x-ray -  EKG -  04/06/23 Epic Stress Test -  ECHO - 02/07/21 Epic Cardiac Cath -   Bowel Prep - [x]  No  []   Yes ______  Pacemaker / ICD device [x]  No []  Yes   Spinal Cord Stimulator:[x]  No []  Yes       History of Sleep Apnea? [x]  No []  Yes   CPAP used?- [x]  No []  Yes    Does the patient monitor blood sugar?          [x]  No []  Yes  []  N/A  Patient has: [x]  NO Hx DM   []  Pre-DM                 []  DM1  []   DM2 Does patient have a [employer] or [employer]om? []  No []  Yes   Fasting Blood Sugar Ranges-  Checks Blood Sugar _____ times a day  GLP1 agonist / usual dose - no GLP1 instructions:  SGLT-2 inhibitors / usual dose - no SGLT-2 instructions:   Blood Thinner / Instructions:Eliquis- Stop 2 days prior to  surgery Aspirin Instructions:no  Comments:   Activity level: Patient is able  to climb a flight of stairs without difficulty; [x]  No CP  [x]  No SOB,   Patient can perform ADLs without assistance.   Anesthesia review: SVT, Stroke, PE, loop recorder  Patient denies shortness of breath, fever, cough and chest pain at PAT appointment.  Patient verbalized understanding and agreement to the Pre-Surgical Instructions that were given to them at this PAT appointment. Patient was also educated of the need to review these PAT instructions again prior to his/her surgery.I reviewed the appropriate phone numbers to call if they have any and questions or concerns.

## 2023-09-18 NOTE — Patient Instructions (Signed)
SURGICAL WAITING ROOM VISITATION  Patients having surgery or a procedure may have no more than 2 support people in the waiting area - these visitors may rotate.    Children under the age of 68 must have an adult with them who is not the patient.  Due to an increase in RSV and influenza rates and associated hospitalizations, children ages 7 and under may not visit patients in [facility] hospitals.  Visitors with respiratory illnesses are discouraged from visiting and should remain at home.  If the patient needs to stay at the hospital during part of their recovery, the visitor guidelines for inpatient rooms apply. Pre-op nurse will coordinate an appropriate time for 1 support person to accompany patient in pre-op.  This support person may not rotate.    Please refer to the [facility] website for the visitor guidelines for Inpatients (after your surgery is over and you are in a regular room).       Your procedure is scheduled on: 10/01/23   Report to [facility] Main Entrance    Report to admitting at  10AM   Call this number if you have problems the morning of surgery [phone]   Do not eat food :After Midnight.   After Midnight you may have the following liquids until 9:25 AM DAY OF SURGERY  Water Non-Citrus Juices (without pulp, NO RED-Apple, White grape, White cranberry) Black Coffee (NO MILK/CREAM OR CREAMERS, sugar ok)  Clear Tea (NO MILK/CREAM OR CREAMERS, sugar ok) regular and decaf                             Plain Jell-O (NO RED)                                           Fruit ices (not with fruit pulp, NO RED)                                     Popsicles (NO RED)                                                               Sports drinks like Gatorade (NO RED)                  The day of surgery:  Drink ONE (1) Pre-Surgery Clear Ensure  at 9:25 AM the morning of surgery. Drink in one sitting. Do not sip.  This drink was given to you during your hospital   pre-op appointment visit. Nothing else to drink after completing the  Pre-Surgery Clear Ensure.       Oral Hygiene is also important to reduce your risk of infection.                                    Remember - BRUSH YOUR TEETH THE MORNING OF SURGERY WITH YOUR REGULAR TOOTHPASTE  DENTURES WILL BE REMOVED PRIOR TO SURGERY PLEASE DO NOT APPLY "Poly grip" OR ADHESIVES!!!  Stop all vitamins and herbal supplements 7 days before surgery.   Take these medicines the morning of surgery with A SIP OF WATER: cetirizine, pravastatin,  tylenol if needed.             You may not have any metal on your body including hair pins, jewelry, and body piercing             Do not wear make-up, lotions, powders, perfumes/cologne, or deodorant  Do not wear nail polish including gel and S&S, artificial/acrylic nails, or any other type of covering on natural nails including finger and toenails. If you have artificial nails, gel coating, etc. that needs to be removed by a nail salon please have this removed prior to surgery or surgery may need to be canceled/ delayed if the surgeon/ anesthesia feels like they are unable to be safely monitored.   Do not shave  48 hours prior to surgery.    Do not bring valuables to the hospital. [facility] IS NOT             RESPONSIBLE   FOR VALUABLES.   Contacts, glasses, dentures or bridgework may not be worn into surgery.  DO NOT BRING YOUR HOME MEDICATIONS TO THE HOSPITAL. PHARMACY WILL DISPENSE MEDICATIONS LISTED ON YOUR MEDICATION LIST TO YOU DURING YOUR ADMISSION IN THE HOSPITAL!    Patients discharged on the day of surgery will not be allowed to drive home.  Someone NEEDS to stay with you for the first 24 hours after anesthesia.   Special Instructions: Bring a copy of your healthcare power of attorney and living will documents the day of surgery if you haven't scanned them before.              Please read over the following fact sheets you were given: IF YOU  HAVE QUESTIONS ABOUT YOUR PRE-OP INSTRUCTIONS PLEASE CALL [phone] [facility]   If you received a COVID test during your pre-op visit  it is requested that you wear a mask when out in public, stay away from anyone that may not be feeling well and notify your surgeon if you develop symptoms. If you test positive for Covid or have been in contact with anyone that has tested positive in the last 10 days please notify you surgeon.      Pre-operative 5 CHG Bath Instructions   You can play a key role in reducing the risk of infection after surgery. Your skin needs to be as free of germs as possible. You can reduce the number of germs on your skin by washing with CHG (chlorhexidine gluconate) soap before surgery. CHG is an antiseptic soap that kills germs and continues to kill germs even after washing.   DO NOT use if you have an allergy to chlorhexidine/CHG or antibacterial soaps. If your skin becomes reddened or irritated, stop using the CHG and notify one of our RNs at [phone].   Please shower with the CHG soap starting 4 days before surgery using the following schedule:     Please keep in mind the following:  DO NOT shave, including legs and underarms, starting the day of your first shower.   You may shave your face at any point before/day of surgery.  Place clean sheets on your bed the day you start using CHG soap. Use a clean washcloth (not used since being washed) for each shower. DO NOT sleep with pets once you start using the CHG.   CHG Shower Instructions:  If you  choose to wash your hair and private area, wash first with your normal shampoo/soap.  After you use shampoo/soap, rinse your hair and body thoroughly to remove shampoo/soap residue.  Turn the water OFF and apply about 3 tablespoons (45 ml) of CHG soap to a CLEAN washcloth.  Apply CHG soap ONLY FROM YOUR NECK DOWN TO YOUR TOES (washing for 3-5 minutes)  DO NOT use CHG soap on face, private areas, open wounds, or sores.   Pay special attention to the area where your surgery is being performed.  If you are having back surgery, having someone wash your back for you may be helpful. Wait 2 minutes after CHG soap is applied, then you may rinse off the CHG soap.  Pat dry with a clean towel  Put on clean clothes/pajamas   If you choose to wear lotion, please use ONLY the CHG-compatible lotions on the back of this paper.     Additional instructions for the day of surgery: DO NOT APPLY any lotions, deodorants, cologne, or perfumes.   Put on clean/comfortable clothes.  Brush your teeth.  Ask your nurse before applying any prescription medications to the skin.      CHG Compatible Lotions   Aveeno Moisturizing lotion  Cetaphil Moisturizing Cream  Cetaphil Moisturizing Lotion  Clairol Herbal Essence Moisturizing Lotion, Dry Skin  Clairol Herbal Essence Moisturizing Lotion, Extra Dry Skin  Clairol Herbal Essence Moisturizing Lotion, Normal Skin  Curel Age Defying Therapeutic Moisturizing Lotion with Alpha Hydroxy  Curel Extreme Care Body Lotion  Curel Soothing Hands Moisturizing Hand Lotion  Curel Therapeutic Moisturizing Cream, Fragrance-Free  Curel Therapeutic Moisturizing Lotion, Fragrance-Free  Curel Therapeutic Moisturizing Lotion, Original Formula  Eucerin Daily Replenishing Lotion  Eucerin Dry Skin Therapy Plus Alpha Hydroxy Crme  Eucerin Dry Skin Therapy Plus Alpha Hydroxy Lotion  Eucerin Original Crme  Eucerin Original Lotion  Eucerin Plus Crme Eucerin Plus Lotion  Eucerin TriLipid Replenishing Lotion  Keri Anti-Bacterial Hand Lotion  Keri Deep Conditioning Original Lotion Dry Skin Formula Softly Scented  Keri Deep Conditioning Original Lotion, Fragrance Free Sensitive Skin Formula  Keri Lotion Fast Absorbing Fragrance Free Sensitive Skin Formula  Keri Lotion Fast Absorbing Softly Scented Dry Skin Formula  Keri Original Lotion  Keri Skin Renewal Lotion Keri Silky Smooth Lotion  Keri Silky  Smooth Sensitive Skin Lotion  Nivea Body Creamy Conditioning Oil  Nivea Body Extra Enriched Lotion  Nivea Body Original Lotion  Nivea Body Sheer Moisturizing Lotion Nivea Crme  Nivea Skin Firming Lotion  NutraDerm 30 Skin Lotion  NutraDerm Skin Lotion  NutraDerm Therapeutic Skin Cream  NutraDerm Therapeutic Skin Lotion  ProShield Protective Hand Cream    Incentive Spirometer  An incentive spirometer is a tool that can help keep your lungs clear and active. This tool measures how well you are filling your lungs with each breath. Taking long deep breaths may help reverse or decrease the chance of developing breathing (pulmonary) problems (especially infection) following: A long period of time when you are unable to move or be active. BEFORE THE PROCEDURE  If the spirometer includes an indicator to show your best effort, your nurse or respiratory therapist will set it to a desired goal. If possible, sit up straight or lean slightly forward. Try not to slouch. Hold the incentive spirometer in an upright position. INSTRUCTIONS FOR USE  Sit on the edge of your bed if possible, or sit up as far as you can in bed or on a chair. Hold the incentive spirometer in an  upright position. Breathe out normally. Place the mouthpiece in your mouth and seal your lips tightly around it. Breathe in slowly and as deeply as possible, raising the piston or the ball toward the top of the column. Hold your breath for 3-5 seconds or for as long as possible. Allow the piston or ball to fall to the bottom of the column. Remove the mouthpiece from your mouth and breathe out normally. Rest for a few seconds and repeat Steps 1 through 7 at least 10 times every 1-2 hours when you are awake. Take your time and take a few normal breaths between deep breaths. The spirometer may include an indicator to show your best effort. Use the indicator as a goal to work toward during each repetition. After each set of 10 deep  breaths, practice coughing to be sure your lungs are clear. If you have an incision (the cut made at the time of surgery), support your incision when coughing by placing a pillow or rolled up towels firmly against it. Once you are able to get out of bed, walk around indoors and cough well. You may stop using the incentive spirometer when instructed by your caregiver.  RISKS AND COMPLICATIONS Take your time so you do not get dizzy or light-headed. If you are in pain, you may need to take or ask for pain medication before doing incentive spirometry. It is harder to take a deep breath if you are having pain. AFTER USE Rest and breathe slowly and easily. It can be helpful to keep track of a log of your progress. Your caregiver can provide you with a simple table to help with this. If you are using the spirometer at home, follow these instructions: SEEK MEDICAL CARE IF:  You are having difficultly using the spirometer. You have trouble using the spirometer as often as instructed. Your pain medication is not giving enough relief while using the spirometer. You develop fever of 100.5 F (38.1 C) or higher. SEEK IMMEDIATE MEDICAL CARE IF:  You cough up bloody sputum that had not been present before. You develop fever of 102 F (38.9 C) or greater. You develop worsening pain at or near the incision site. MAKE SURE YOU:  Understand these instructions. Will watch your condition. Will get help right away if you are not doing well or get worse. Document Released: 12/22/2006 Document Revised: 11/03/2011 Document Reviewed: 02/22/2007 [facility] Patient Information 2014 [place], [place]- Preparing for Total Shoulder Arthroplasty    Before surgery, you can play an important role. Because skin is not sterile, your skin needs to be as free of germs as possible. You can reduce the number of germs on your skin by using the following products. Benzoyl Peroxide Gel Reduces the number of germs present  on the skin Applied twice a day to shoulder area starting two days before surgery    ==================================================================  Please follow these instructions carefully:  BENZOYL PEROXIDE 5% GEL  Please do not use if you have an allergy to benzoyl peroxide.   If your skin becomes reddened/irritated stop using the benzoyl peroxide.  Starting two days before surgery, apply as follows: Apply benzoyl peroxide in the morning and at night. Apply after taking a shower. If you are not taking a shower clean entire shoulder front, back, and side along with the armpit with a clean wet washcloth.  Place a quarter-sized dollop on your shoulder and rub in thoroughly, making sure to cover the front, back, and side of your shoulder, along  with the armpit.   2 days before ____ AM   ____ PM              1 day before ____ AM   ____ PM                         Do this twice a day for two days.  (Last application is the night before surgery, AFTER using the CHG soap as described below).  Do NOT apply benzoyl peroxide gel on the day of surgery.

## 2023-09-18 NOTE — Progress Notes (Signed)
Virtual Visit via Telephone Note   Because of Sarah Valdez's co-morbid illnesses, she is at least at moderate risk for complications without adequate follow up.  This format is felt to be most appropriate for this patient at this time.  The patient did not have access to video technology/had technical difficulties with video requiring transitioning to audio format only (telephone).  All issues noted in this document were discussed and addressed.  No physical exam could be performed with this format.  Please refer to the patient's chart for her consent to telehealth for [facility].  Evaluation Performed:  Preoperative cardiovascular risk assessment _____________   Date:  09/18/2023   Patient ID:  Sarah Valdez, DOB July 31, 1946, MRN [id number] Patient Location:  Home Provider location:   Office  Primary Care Provider:  Creola Corn, MD Primary Cardiologist:  None  Chief Complaint / Patient Profile   78 y.o. y/o Sarah with a h/o PSVT, recurrent PE, CVA, HLD who is pending left reverse shoulder arthroplasty and presents today for telephonic preoperative cardiovascular risk assessment.  History of Present Illness    Sarah Valdez is a 78 y.o. Sarah who presents via audio/video conferencing for a telehealth visit today.  Pt was last seen in cardiology clinic on 04/06/2023 by Francis Dowse, PA.  At that time Sarah Valdez was doing well and tolerating medications with resolution of lower extremity swelling after stopping Cardizem. She is active walking and going to water aerobics and denied any palpitations after being off of Cardizem. The patient is now pending procedure as outlined above. Since her last visit, she has been doing well with no new cardiac complaints.  She reports no recurrence of palpitations after being off of Cardizem and has tolerated her current medications without any adverse reactions.  She denies chest pain, shortness of breath, lower extremity edema,  fatigue, palpitations, melena, hematuria, hemoptysis, diaphoresis, weakness, presyncope, syncope, orthopnea, and PND.    Past Medical History    Past Medical History:  Diagnosis Date   Allergy    Bilateral sensorineural hearing loss 11/06/2016   Cancer (HCC)    skin cancer - on back, forehead   Chronic congestive heart failure (HCC) 02/22/2018   Cryptogenic stroke (HCC)    followed by Dr Lucia Gaskins   Diverticulosis of colon 2007   Dr Juanda Chance   Dysrhythmia    Gallstones 01/20/2014   12/2013 multiple gallstones, largest 1.7 cm in diameter; asymptomatic    History of basal cell carcinoma excision    X3   History of colon polyps    NON-CANCEROUS   History of kidney stones    Hyperlipidemia    OA (osteoarthritis of spine)    Palpitations 11/24/2007   Qualifier: Diagnosis of  By: Alwyn Ren MD, William     Pancreatic cyst 01/20/2014   01/03/14 8 x 11 mm nonaggressive cystic lesion the pancreas. Followup recommended in one year.    PONV (postoperative nausea and vomiting)    PSVT (paroxysmal supraventricular tachycardia)    adenosine sensitive SVT previously documented   Pulmonary embolism (HCC)    Right ureteral stone    Urgency of urination    Wears glasses    Past Surgical History:  Procedure Laterality Date   APPENDECTOMY  1950   CARPECTOMY WITH RADIAL STYLOIDECTOMY Right 09/12/2010   AND NEURECTOMY  OF WRIST (STAGE III [place]BOCK DISEASE)   CLOSED MANIPULATION POST TOTAL  KNEE ARTHROPLASTY  07/13/2006   COLONOSCOPY W/ POLYPECTOMY  2002   adenomatous;  Dr Juanda Chance   epidural steroid injections     two rounds of injections; lumbar spine    EXTRACORPOREAL SHOCK WAVE LITHOTRIPSY  X3   FACELIFT W/BLEPHAROPLASTY  2005   HYSTEROSCOPY WITH D & C  12/22/2000   implantable loop recorder removal  10/29/2020   MDT Reveal LINQ removed by Dr Johney Frame   LOOP RECORDER INSERTION N/A 05/21/2017   Procedure: LOOP RECORDER INSERTION;  Surgeon: Hillis Range, MD;  [facility]: [facility];  Service:  Cardiovascular;  Laterality: N/A;   OPEN KNEE MENISECTOMY  1968   RIGHT URETEROSCOPIC STONE EXTRACTION / STENT PLACEMENT  08/21/2008   rotator cuff on right shoulder     TONSILLECTOMY  1950   TOTAL HIP ARTHROPLASTY Left 12/02/2022   Procedure: TOTAL HIP ARTHROPLASTY ANTERIOR APPROACH;  Surgeon: Durene Romans, MD;  Location: [facility];  Service: Orthopedics;  Laterality: Left;   TOTAL KNEE ARTHROPLASTY Right 05/30/2006   TUBAL LIGATION     WISDOM TOOTH EXTRACTION      Allergies  Allergies  Allergen Reactions   Morphine Other (See Comments)    Respiratory compromise post op   Penicillamine Itching   Penicillins Hives    Tolerates Ancef 12/02/22   Levofloxacin Other (See Comments)     SHOULDER PAIN    Home Medications    Prior to Admission medications   Medication Sig Start Date End Date Taking? Authorizing Provider  acetaminophen (TYLENOL) 650 MG CR tablet Take 1,300 mg by mouth every 8 (eight) hours as needed for pain.    [provider]  apixaban (ELIQUIS) 5 MG TABS tablet Take 5 mg by mouth in the morning and at bedtime.    [provider]  Brimonidine Tartrate (LUMIFY) 0.025 % SOLN Place 1 drop into both eyes 2 (two) times daily.    [provider]  cetirizine (ZYRTEC) 10 MG tablet Take 10 mg by mouth daily as needed for allergies.    [provider]  Cholecalciferol (VITAMIN D3) 250 MCG (10000 UT) capsule Take 10,000 Units by mouth daily.    [provider]  fluticasone (FLONASE) 50 MCG/ACT nasal spray Place 1 spray into both nostrils daily as needed for allergies.    [provider]  nystatin-triamcinolone ointment (MYCOLOG) Apply 1 Application topically 2 (two) times daily as needed (itching). 06/19/23   [provider]  pravastatin (PRAVACHOL) 40 MG tablet Take 40 mg by mouth daily.    [provider]    Physical Exam    Vital Signs:  Sarah Valdez does not have vital signs available for review  today.  Given telephonic nature of communication, physical exam is limited. AAOx3. NAD. Normal affect.  Speech and respirations are unlabored.  Accessory Clinical Findings    None  Assessment & Plan    1.  Preoperative Cardiovascular Risk Assessment: -Patient's RCRI score 6.6% The patient affirms she has been doing well without any new cardiac symptoms. They are able to achieve 7 METS without cardiac limitations. Therefore, based on ACC/AHA guidelines, the patient would be at acceptable risk for the planned procedure without further cardiovascular testing. The patient was advised that if she develops new symptoms prior to surgery to contact our office to arrange for a follow-up visit, and she verbalized understanding.   The patient was advised that if she develops new symptoms prior to surgery to contact our office to arrange for a follow-up visit, and she verbalized understanding.  Guidance for holding Eliquis should come from prescribing provider and  not managed by cardiology  A copy of this note will be routed to requesting surgeon.  Time:   Today, I have spent 8 minutes with the patient with telehealth technology discussing medical history, symptoms, and management plan.     Napoleon Form, Leodis Rains, NP  09/18/2023, 7:59 AM

## 2023-09-21 ENCOUNTER — Encounter (HOSPITAL_COMMUNITY): Payer: Self-pay

## 2023-09-21 ENCOUNTER — Other Ambulatory Visit: Payer: Self-pay

## 2023-09-21 ENCOUNTER — Encounter (HOSPITAL_COMMUNITY)
Admission: RE | Admit: 2023-09-21 | Discharge: 2023-09-21 | Disposition: A | Discharge status: Home / Self Care | Payer: Medicare HMO | Source: Ambulatory Visit | Attending: Orthopedic Surgery | Admitting: Orthopedic Surgery

## 2023-09-21 VITALS — BP 137/87 | HR 71 | Temp 98.1°F | Resp 16 | Ht 67.0 in | Wt 166.0 lb

## 2023-09-21 DIAGNOSIS — Z01818 Encounter for other preprocedural examination: Secondary | ICD-10-CM | POA: Diagnosis not present

## 2023-09-21 DIAGNOSIS — Z8673 Personal history of transient ischemic attack (TIA), and cerebral infarction without residual deficits: Secondary | ICD-10-CM | POA: Diagnosis not present

## 2023-09-21 DIAGNOSIS — M19012 Primary osteoarthritis, left shoulder: Secondary | ICD-10-CM | POA: Insufficient documentation

## 2023-09-21 DIAGNOSIS — Z86711 Personal history of pulmonary embolism: Secondary | ICD-10-CM | POA: Diagnosis not present

## 2023-09-21 DIAGNOSIS — I10 Essential (primary) hypertension: Secondary | ICD-10-CM

## 2023-09-21 LAB — SURGICAL PCR SCREEN
MRSA, PCR: NEGATIVE
Staphylococcus aureus: NEGATIVE

## 2023-09-21 LAB — BASIC METABOLIC PANEL
Anion gap: 9 (ref 5–15)
BUN: 26 mg/dL — ABNORMAL HIGH (ref 8–23)
CO2: 24 mmol/L (ref 22–32)
Calcium: 10.1 mg/dL (ref 8.9–10.3)
Chloride: 109 mmol/L (ref 98–111)
Creatinine, Ser: 0.98 mg/dL (ref 0.44–1.00)
GFR, Estimated: 59 mL/min — ABNORMAL LOW (ref >60)
Glucose, Bld: 96 mg/dL (ref 70–99)
Potassium: 4.5 mmol/L (ref 3.5–5.1)
Sodium: 142 mmol/L (ref 135–145)

## 2023-09-21 LAB — CBC
HCT: 43 % (ref 36.0–46.0)
Hemoglobin: 14 g/dL (ref 12.0–15.0)
MCH: 32 pg (ref 26.0–34.0)
MCHC: 32.6 g/dL (ref 30.0–36.0)
MCV: 98.4 fL (ref 80.0–100.0)
Platelets: 214 10*3/uL (ref 150–400)
RBC: 4.37 MIL/uL (ref 3.87–5.11)
RDW: 13.3 % (ref 11.5–15.5)
WBC: 6.6 10*3/uL (ref 4.0–10.5)
nRBC: 0 % (ref 0.0–0.2)

## 2023-09-22 NOTE — Anesthesia Preprocedure Evaluation (Signed)
Anesthesia Evaluation    Airway        Dental   Pulmonary           Cardiovascular      Neuro/Psych    GI/Hepatic   Endo/Other    Renal/GU      Musculoskeletal   Abdominal   Peds  Hematology   Anesthesia Other Findings   Reproductive/Obstetrics                             Anesthesia Physical Anesthesia Plan  ASA:   Anesthesia Plan:    Post-op Pain Management:    Induction:   PONV Risk Score and Plan:   Airway Management Planned:   Additional Equipment:   Intra-op Plan:   Post-operative Plan:   Informed Consent:   Plan Discussed with:   Anesthesia Plan Comments: (See PAT note 09/21/2023)       Anesthesia Quick Evaluation

## 2023-09-22 NOTE — Progress Notes (Signed)
Anesthesia Chart Review   Case: [id number] Date/Time: 10/01/23 1209   Procedure: REVERSE SHOULDER ARTHROPLASTY (Left: Shoulder) -   Anesthesia type: General   Pre-op diagnosis: Left shoulder osteoarthritis   Location: [facility] 06 / [facility]   Surgeons: Francena Hanly, MD       DISCUSSION:77 y.o. never smoker with h/o PSVT, recurrent PE, CVA, left shoulder OA scheduled for above procedure 10/01/2023 with Dr. Francena Hanly.   Pt seen by cardiology 09/18/2023. Per OV note, "-Patient's RCRI score 6.6% The patient affirms she has been doing well without any new cardiac symptoms. They are able to achieve 7 METS without cardiac limitations. Therefore, based on ACC/AHA guidelines, the patient would be at acceptable risk for the planned procedure without further cardiovascular testing. The patient was advised that if she develops new symptoms prior to surgery to contact our office to arrange for a follow-up visit, and she verbalized understanding.    The patient was advised that if she develops new symptoms prior to surgery to contact our office to arrange for a follow-up visit, and she verbalized understanding.   Guidance for holding Eliquis should come from prescribing provider and not managed by cardiology"  Note from PCP states pt can hold Eliquis for 2 days prior to procedure.  VS: BP 137/87   Pulse 71   Temp 36.7 C (Oral)   Resp 16   Ht 5\' 7"  (1.702 m)   Wt 75.3 kg   SpO2 98%   BMI 26.00 kg/m   PROVIDERS: Creola Corn, MD is PCP    LABS: Labs reviewed: Acceptable for surgery. (all labs ordered are listed, but only abnormal results are displayed)  Labs Reviewed  BASIC METABOLIC PANEL - Abnormal; Notable for the following components:      Result Value   BUN 26 (*)    GFR, Estimated 59 (*)    All other components within normal limits  SURGICAL PCR SCREEN  CBC     IMAGES:   EKG:   CV: Echo 02/07/2021 1. Left ventricular ejection fraction, by estimation, is 55 to 60%.  The  left ventricle has normal function. The left ventricle has no regional  wall motion abnormalities. Left ventricular diastolic parameters are  consistent with Grade I diastolic  dysfunction (impaired relaxation). The average left ventricular global  longitudinal strain is -17.5 %. The global longitudinal strain is normal.   2. Right ventricular systolic function is normal. The right ventricular  size is normal. Tricuspid regurgitation signal is inadequate for assessing  PA pressure.   3. The mitral valve is normal in structure. No evidence of mitral valve  regurgitation. No evidence of mitral stenosis.   4. The aortic valve is tricuspid. Aortic valve regurgitation is not  visualized. No aortic stenosis is present.   5. The inferior vena cava is normal in size with greater than 50%  respiratory variability, suggesting right atrial pressure of 3 mmHg.  Past Medical History:  Diagnosis Date   Allergy    Bilateral sensorineural hearing loss 11/06/2016   Cancer (HCC)    skin cancer - on back, forehead   Chronic congestive heart failure (HCC) 02/22/2018   Cryptogenic stroke (HCC)    followed by Dr Lucia Gaskins   Diverticulosis of colon 2007   Dr Juanda Chance   Dysrhythmia    Gallstones 01/20/2014   12/2013 multiple gallstones, largest 1.7 cm in diameter; asymptomatic    History of basal cell carcinoma excision    X3   History of colon  polyps    NON-CANCEROUS   History of kidney stones    Hyperlipidemia    OA (osteoarthritis of spine)    Palpitations 11/24/2007   Qualifier: Diagnosis of  By: Alwyn Ren MD, William     Pancreatic cyst 01/20/2014   01/03/14 8 x 11 mm nonaggressive cystic lesion the pancreas. Followup recommended in one year.    PONV (postoperative nausea and vomiting)    PSVT (paroxysmal supraventricular tachycardia) (HCC)    adenosine sensitive SVT previously documented   Pulmonary embolism (HCC)    Right ureteral stone    Urgency of urination    Wears glasses     Past  Surgical History:  Procedure Laterality Date   APPENDECTOMY  1950   CARPECTOMY WITH RADIAL STYLOIDECTOMY Right 09/12/2010   AND NEURECTOMY  OF WRIST (STAGE III [place] DISEASE)   CLOSED MANIPULATION POST TOTAL  KNEE ARTHROPLASTY  07/13/2006   COLONOSCOPY W/ POLYPECTOMY  2002   adenomatous; Dr Juanda Chance   epidural steroid injections     two rounds of injections; lumbar spine    EXTRACORPOREAL SHOCK WAVE LITHOTRIPSY  X3   FACELIFT W/BLEPHAROPLASTY  2005   HYSTEROSCOPY WITH D & C  12/22/2000   implantable loop recorder removal  10/29/2020   MDT Reveal LINQ removed by Dr Johney Frame   LOOP RECORDER INSERTION N/A 05/21/2017   Procedure: LOOP RECORDER INSERTION;  Surgeon: Hillis Range, MD;  Location: [facility];  Service: Cardiovascular;  Laterality: N/A;   OPEN KNEE MENISECTOMY  1968   RIGHT URETEROSCOPIC STONE EXTRACTION / STENT PLACEMENT  08/21/2008   rotator cuff on right shoulder     TONSILLECTOMY  1950   TOTAL HIP ARTHROPLASTY Left 12/02/2022   Procedure: TOTAL HIP ARTHROPLASTY ANTERIOR APPROACH;  Surgeon: Durene Romans, MD;  Location: [facility];  Service: Orthopedics;  Laterality: Left;   TOTAL KNEE ARTHROPLASTY Right 05/30/2006   TUBAL LIGATION     WISDOM TOOTH EXTRACTION      MEDICATIONS:  acetaminophen (TYLENOL) 650 MG CR tablet   apixaban (ELIQUIS) 5 MG TABS tablet   Brimonidine Tartrate (LUMIFY) 0.025 % SOLN   cetirizine (ZYRTEC) 10 MG tablet   Cholecalciferol (VITAMIN D3) 250 MCG (10000 UT) capsule   fluticasone (FLONASE) 50 MCG/ACT nasal spray   nystatin-triamcinolone ointment (MYCOLOG)   pravastatin (PRAVACHOL) 40 MG tablet   No current facility-administered medications for this encounter.     Jodell Cipro Ward, PA-C [facility] [phone]

## 2023-10-01 ENCOUNTER — Encounter (HOSPITAL_COMMUNITY)
Admission: RE | Disposition: A | Discharge status: Home / Self Care | Payer: Self-pay | Source: Ambulatory Visit | Attending: Orthopedic Surgery

## 2023-10-01 ENCOUNTER — Ambulatory Visit (HOSPITAL_COMMUNITY)
Admission: RE | Admit: 2023-10-01 | Discharge: 2023-10-01 | Disposition: A | Discharge status: Home / Self Care | Payer: Medicare HMO | Source: Ambulatory Visit | Attending: Orthopedic Surgery | Admitting: Orthopedic Surgery

## 2023-10-01 ENCOUNTER — Other Ambulatory Visit: Payer: Self-pay

## 2023-10-01 ENCOUNTER — Ambulatory Visit (HOSPITAL_COMMUNITY): Payer: Medicare HMO | Admitting: Physician Assistant

## 2023-10-01 ENCOUNTER — Encounter (HOSPITAL_COMMUNITY): Payer: Self-pay | Admitting: Orthopedic Surgery

## 2023-10-01 ENCOUNTER — Ambulatory Visit (HOSPITAL_COMMUNITY): Payer: Medicare HMO | Admitting: Anesthesiology

## 2023-10-01 DIAGNOSIS — I110 Hypertensive heart disease with heart failure: Secondary | ICD-10-CM

## 2023-10-01 DIAGNOSIS — I4891 Unspecified atrial fibrillation: Secondary | ICD-10-CM

## 2023-10-01 DIAGNOSIS — M19012 Primary osteoarthritis, left shoulder: Secondary | ICD-10-CM | POA: Diagnosis not present

## 2023-10-01 DIAGNOSIS — G8918 Other acute postprocedural pain: Secondary | ICD-10-CM | POA: Diagnosis not present

## 2023-10-01 DIAGNOSIS — I509 Heart failure, unspecified: Secondary | ICD-10-CM

## 2023-10-01 DIAGNOSIS — Z86711 Personal history of pulmonary embolism: Secondary | ICD-10-CM | POA: Diagnosis not present

## 2023-10-01 DIAGNOSIS — M75102 Unspecified rotator cuff tear or rupture of left shoulder, not specified as traumatic: Secondary | ICD-10-CM | POA: Diagnosis not present

## 2023-10-01 DIAGNOSIS — Z8673 Personal history of transient ischemic attack (TIA), and cerebral infarction without residual deficits: Secondary | ICD-10-CM | POA: Insufficient documentation

## 2023-10-01 HISTORY — PX: REVERSE SHOULDER ARTHROPLASTY: SHX5054

## 2023-10-01 SURGERY — ARTHROPLASTY, SHOULDER, TOTAL, REVERSE
Anesthesia: Regional | Site: Shoulder | Laterality: Left

## 2023-10-01 MED ORDER — ACETAMINOPHEN 325 MG PO TABS: 325.0000 mg | ORAL_TABLET | ORAL | Status: DC | PRN

## 2023-10-01 MED ORDER — DEXAMETHASONE SODIUM PHOSPHATE 4 MG/ML IJ SOLN
INTRAMUSCULAR | Status: DC | PRN
  Administered 2023-10-01: 4 mg via INTRAVENOUS

## 2023-10-01 MED ORDER — PHENYLEPHRINE HCL-NACL 20-0.9 MG/250ML-% IV SOLN
INTRAVENOUS | Status: DC | PRN
  Administered 2023-10-01: 25 ug/min via INTRAVENOUS

## 2023-10-01 MED ORDER — TRANEXAMIC ACID-NACL 1000-0.7 MG/100ML-% IV SOLN
1000.0000 mg | INTRAVENOUS | Status: AC
  Administered 2023-10-01: 1000 mg via INTRAVENOUS
  Filled 2023-10-01: qty 100

## 2023-10-01 MED ORDER — MEPERIDINE HCL 50 MG/ML IJ SOLN: 6.2500 mg | INTRAMUSCULAR | Status: DC | PRN

## 2023-10-01 MED ORDER — LACTATED RINGERS IV SOLN: INTRAVENOUS | Status: DC

## 2023-10-01 MED ORDER — ROCURONIUM BROMIDE 100 MG/10ML IV SOLN
INTRAVENOUS | Status: DC | PRN
  Administered 2023-10-01: 60 mg via INTRAVENOUS

## 2023-10-01 MED ORDER — METOCLOPRAMIDE HCL 5 MG/ML IJ SOLN: 5.0000 mg | Freq: Three times a day (TID) | INTRAMUSCULAR | Status: DC | PRN

## 2023-10-01 MED ORDER — FENTANYL CITRATE PF 50 MCG/ML IJ SOSY
25.0000 ug | PREFILLED_SYRINGE | INTRAMUSCULAR | Status: DC | PRN
Start: 2023-10-01 — End: 2023-10-01
  Administered 2023-10-01 (×2): 50 ug via INTRAVENOUS

## 2023-10-01 MED ORDER — LABETALOL HCL 5 MG/ML IV SOLN
INTRAVENOUS | Status: AC
  Filled 2023-10-01: qty 4

## 2023-10-01 MED ORDER — ORAL CARE MOUTH RINSE
15.0000 mL | Freq: Once | OROMUCOSAL | Status: AC
Start: 2023-10-01 — End: 2023-10-01

## 2023-10-01 MED ORDER — PROPOFOL 10 MG/ML IV BOLUS
INTRAVENOUS | Status: DC | PRN
  Administered 2023-10-01: 110 mg via INTRAVENOUS
  Administered 2023-10-01: 90 mg via INTRAVENOUS

## 2023-10-01 MED ORDER — VANCOMYCIN HCL 1000 MG IV SOLR
INTRAVENOUS | Status: DC | PRN
  Administered 2023-10-01: 1000 mg via TOPICAL

## 2023-10-01 MED ORDER — ACETAMINOPHEN 160 MG/5ML PO SOLN: 325.0000 mg | ORAL | Status: DC | PRN

## 2023-10-01 MED ORDER — PHENYLEPHRINE HCL (PRESSORS) 10 MG/ML IV SOLN
INTRAVENOUS | Status: DC | PRN
  Administered 2023-10-01 (×2): 160 ug via INTRAVENOUS

## 2023-10-01 MED ORDER — CHLORHEXIDINE GLUCONATE 0.12 % MT SOLN
15.0000 mL | Freq: Once | OROMUCOSAL | Status: AC
  Administered 2023-10-01: 15 mL via OROMUCOSAL

## 2023-10-01 MED ORDER — HYDROMORPHONE HCL 1 MG/ML IJ SOLN
INTRAMUSCULAR | Status: DC | PRN
  Administered 2023-10-01: 1 mg via INTRAVENOUS

## 2023-10-01 MED ORDER — ONDANSETRON HCL 4 MG/2ML IJ SOLN
INTRAMUSCULAR | Status: DC | PRN
  Administered 2023-10-01: 4 mg via INTRAVENOUS

## 2023-10-01 MED ORDER — HYDROMORPHONE HCL 2 MG PO TABS: 2.0000 mg | ORAL_TABLET | ORAL | 0 refills | Status: DC | PRN

## 2023-10-01 MED ORDER — BUPIVACAINE HCL (PF) 0.5 % IJ SOLN
INTRAMUSCULAR | Status: DC | PRN
  Administered 2023-10-01: 15 mL via PERINEURAL

## 2023-10-01 MED ORDER — CYCLOBENZAPRINE HCL 10 MG PO TABS: 10.0000 mg | ORAL_TABLET | Freq: Three times a day (TID) | ORAL | 1 refills | Status: AC | PRN

## 2023-10-01 MED ORDER — ONDANSETRON HCL 4 MG/2ML IJ SOLN: 4.0000 mg | Freq: Once | INTRAMUSCULAR | Status: DC | PRN

## 2023-10-01 MED ORDER — BUPIVACAINE LIPOSOME 1.3 % IJ SUSP
INTRAMUSCULAR | Status: DC | PRN
  Administered 2023-10-01: 10 mL via PERINEURAL

## 2023-10-01 MED ORDER — SUGAMMADEX SODIUM 200 MG/2ML IV SOLN
INTRAVENOUS | Status: DC | PRN
  Administered 2023-10-01: 50 mg via INTRAVENOUS
  Administered 2023-10-01: 150 mg via INTRAVENOUS

## 2023-10-01 MED ORDER — METOCLOPRAMIDE HCL 5 MG PO TABS: 5.0000 mg | ORAL_TABLET | Freq: Three times a day (TID) | ORAL | Status: DC | PRN

## 2023-10-01 MED ORDER — FENTANYL CITRATE PF 50 MCG/ML IJ SOSY
PREFILLED_SYRINGE | INTRAMUSCULAR | Status: AC
  Filled 2023-10-01: qty 1

## 2023-10-01 MED ORDER — FENTANYL CITRATE PF 50 MCG/ML IJ SOSY
50.0000 ug | PREFILLED_SYRINGE | INTRAMUSCULAR | Status: DC
  Administered 2023-10-01: 100 ug via INTRAVENOUS
  Filled 2023-10-01: qty 2

## 2023-10-01 MED ORDER — CEFAZOLIN SODIUM-DEXTROSE 2-4 GM/100ML-% IV SOLN
2.0000 g | INTRAVENOUS | Status: AC
  Administered 2023-10-01: 2 g via INTRAVENOUS
  Filled 2023-10-01: qty 100

## 2023-10-01 MED ORDER — OXYCODONE HCL 5 MG PO TABS: 5.0000 mg | ORAL_TABLET | Freq: Once | ORAL | Status: DC | PRN

## 2023-10-01 MED ORDER — 0.9 % SODIUM CHLORIDE (POUR BTL) OPTIME
TOPICAL | Status: DC | PRN
  Administered 2023-10-01: 1000 mL

## 2023-10-01 MED ORDER — OXYCODONE HCL 5 MG/5ML PO SOLN: 5.0000 mg | Freq: Once | ORAL | Status: DC | PRN

## 2023-10-01 MED ORDER — ONDANSETRON HCL 4 MG PO TABS: 4.0000 mg | ORAL_TABLET | Freq: Three times a day (TID) | ORAL | 0 refills | Status: AC | PRN

## 2023-10-01 MED ORDER — ACETAMINOPHEN 500 MG PO TABS
1000.0000 mg | ORAL_TABLET | Freq: Once | ORAL | Status: DC
  Filled 2023-10-01: qty 2

## 2023-10-01 MED ORDER — HYDROMORPHONE HCL 2 MG/ML IJ SOLN
INTRAMUSCULAR | Status: AC
  Filled 2023-10-01: qty 1

## 2023-10-01 MED ORDER — LABETALOL HCL 5 MG/ML IV SOLN
10.0000 mg | INTRAVENOUS | Status: DC | PRN
  Administered 2023-10-01: 10 mg via INTRAVENOUS

## 2023-10-01 MED ORDER — LIDOCAINE HCL (CARDIAC) PF 100 MG/5ML IV SOSY
PREFILLED_SYRINGE | INTRAVENOUS | Status: DC | PRN
  Administered 2023-10-01: 80 mg via INTRAVENOUS

## 2023-10-01 MED ORDER — VANCOMYCIN HCL 1000 MG IV SOLR
INTRAVENOUS | Status: AC
  Filled 2023-10-01: qty 20

## 2023-10-01 SURGICAL SUPPLY — 62 items
BAG COUNTER SPONGE SURGICOUNT (BAG) IMPLANT
BAG ZIPLOCK 12X15 (MISCELLANEOUS) ×1 IMPLANT
BIT DRILL AR 3 NS (BIT) IMPLANT
BLADE SAW SGTL 83.5X18.5 (BLADE) ×1 IMPLANT
BNDG COHESIVE 4X5 TAN STRL LF (GAUZE/BANDAGES/DRESSINGS) ×1 IMPLANT
COOLER ICEMAN CLASSIC (MISCELLANEOUS) ×1 IMPLANT
COVER BACK TABLE 60X90IN (DRAPES) ×1 IMPLANT
COVER SURGICAL LIGHT HANDLE (MISCELLANEOUS) ×1 IMPLANT
CUP SUT UNIV REVERS 36 NEUTRAL (Cup) IMPLANT
DRAPE SHEET LG 3/4 BI-LAMINATE (DRAPES) ×1 IMPLANT
DRAPE SURG 17X11 SM STRL (DRAPES) ×1 IMPLANT
DRAPE SURG ORHT 6 SPLT 77X108 (DRAPES) ×2 IMPLANT
DRAPE TOP 10253 STERILE (DRAPES) ×1 IMPLANT
DRAPE U-SHAPE 47X51 STRL (DRAPES) ×1 IMPLANT
DRESSING AQUACEL AG SP 3.5X6 (GAUZE/BANDAGES/DRESSINGS) ×1 IMPLANT
DRSG AQUACEL AG ADV 3.5X 6 (GAUZE/BANDAGES/DRESSINGS) IMPLANT
DRSG AQUACEL AG ADV 3.5X10 (GAUZE/BANDAGES/DRESSINGS) IMPLANT
DRSG AQUACEL AG SP 3.5X6 (GAUZE/BANDAGES/DRESSINGS) ×1
DURAPREP 26ML APPLICATOR (WOUND CARE) ×1 IMPLANT
ELECT BLADE TIP CTD 4 INCH (ELECTRODE) ×1 IMPLANT
ELECT PENCIL ROCKER SW 15FT (MISCELLANEOUS) ×1 IMPLANT
ELECT REM PT RETURN 15FT ADLT (MISCELLANEOUS) ×1 IMPLANT
FACESHIELD WRAPAROUND (MASK) ×5
FACESHIELD WRAPAROUND OR TEAM (MASK) ×5 IMPLANT
GLENOID UNI REV MOD 24 +2 LAT (Joint) IMPLANT
GLENOSPHERE 36 +4 LAT/24 (Joint) IMPLANT
GLOVE BIO SURGEON STRL SZ7.5 (GLOVE) ×1 IMPLANT
GLOVE BIO SURGEON STRL SZ8 (GLOVE) ×1 IMPLANT
GLOVE SS BIOGEL STRL SZ 7 (GLOVE) ×1 IMPLANT
GLOVE SS BIOGEL STRL SZ 7.5 (GLOVE) ×1 IMPLANT
GOWN STRL SURGICAL XL XLNG (GOWN DISPOSABLE) ×2 IMPLANT
INSERT HUMERAL UNI REVERS 36 3 (Insert) IMPLANT
KIT BASIN OR (CUSTOM PROCEDURE TRAY) ×1 IMPLANT
KIT TURNOVER KIT A (KITS) IMPLANT
MANIFOLD NEPTUNE II (INSTRUMENTS) ×1 IMPLANT
NDL TAPERED W/ NITINOL LOOP (MISCELLANEOUS) ×1 IMPLANT
NEEDLE TAPERED W/ NITINOL LOOP (MISCELLANEOUS) ×1
NS IRRIG 1000ML POUR BTL (IV SOLUTION) ×1 IMPLANT
PACK SHOULDER (CUSTOM PROCEDURE TRAY) ×1 IMPLANT
PAD ARMBOARD 7.5X6 YLW CONV (MISCELLANEOUS) ×1 IMPLANT
PAD COLD SHLDR WRAP-ON (PAD) ×1 IMPLANT
PIN NITINOL TARGETER 2.8 (PIN) IMPLANT
PIN SET MODULAR GLENOID SYSTEM (PIN) IMPLANT
RESTRAINT HEAD UNIVERSAL NS (MISCELLANEOUS) ×1 IMPLANT
SCREW CENTRAL MOD 30MM (Screw) IMPLANT
SCREW PERI LOCK 5.5X24 (Screw) IMPLANT
SCREW PERI LOCK 5.5X36 (Screw) IMPLANT
SCREW PERIPHERAL 5.5X20 LOCK (Screw) IMPLANT
SLING ARM FOAM STRAP LRG (SOFTGOODS) IMPLANT
SLING ARM FOAM STRAP MED (SOFTGOODS) IMPLANT
STEM HUMERAL UNI REVERS SZ9 (Stem) IMPLANT
STRIP CLOSURE SKIN 1/2X4 (GAUZE/BANDAGES/DRESSINGS) ×1 IMPLANT
SUT MNCRL AB 3-0 PS2 18 (SUTURE) ×1 IMPLANT
SUT MON AB 2-0 CT1 36 (SUTURE) ×1 IMPLANT
SUT VIC AB 1 CT1 36 (SUTURE) ×1 IMPLANT
SUTURE TAPE 1.3 40 TPR END (SUTURE) ×2 IMPLANT
SUTURETAPE 1.3 40 TPR END (SUTURE) ×2
TOWEL GREEN STERILE FF (TOWEL DISPOSABLE) ×1 IMPLANT
TOWEL OR 17X26 10 PK STRL BLUE (TOWEL DISPOSABLE) ×1 IMPLANT
TUBE SUCTION HIGH CAP CLEAR NV (SUCTIONS) ×1 IMPLANT
TUBING CONNECTING 10 (TUBING) ×1 IMPLANT
WATER STERILE IRR 1000ML POUR (IV SOLUTION) ×2 IMPLANT

## 2023-10-01 NOTE — Care Plan (Signed)
 Ortho Bundle Case Management Note  Patient Details  Name: [name] MRN: [id number] Date of Birth: 08-04-1946                  DCP: Home with husband  DME: Ice machine provided by the hospital  PT: [facility] 10-30-23   DME Arranged:  N/A DME Agency:     HH Arranged:    HH Agency:     Additional Comments: Please contact me with any questions of if this plan should need to change.  Kate DELENA Kraft, RN,CCM [facility]  [phone] 10/01/2023, 3:38 PM

## 2023-10-01 NOTE — H&P (Signed)
 Sarah Valdez    Chief Complaint: Left shoulder osteoarthritis HPI: The patient is a 78 y.o. female with chronic and progressively increasing left shoulder pain related to severe osteoarthritis with bony deformity and associated rotator cuff dysfunction.  Due to her increasing functional limitations and failure to respond to prolonged attempts at conservative management, she is brought to the operating room today for planned left shoulder reverse arthroplasty  Past Medical History:  Diagnosis Date   Allergy    Bilateral sensorineural hearing loss 11/06/2016   Cancer (HCC)    skin cancer - on back, forehead   Chronic congestive heart failure (HCC) 02/22/2018   Cryptogenic stroke [facility])    followed by Dr Ines   Diverticulosis of colon 2007   Dr Obie   Dysrhythmia    Gallstones 01/20/2014   12/2013 multiple gallstones, largest 1.7 cm in diameter; asymptomatic    History of basal cell carcinoma excision    X3   History of colon polyps    NON-CANCEROUS   History of kidney stones    Hyperlipidemia    OA (osteoarthritis of spine)    Palpitations 11/24/2007   Qualifier: Diagnosis of  By: Tish MD, William     Pancreatic cyst 01/20/2014   01/03/14 8 x 11 mm nonaggressive cystic lesion the pancreas. Followup recommended in one year.    PONV (postoperative nausea and vomiting)    PSVT (paroxysmal supraventricular tachycardia) (HCC)    adenosine sensitive SVT previously documented   Pulmonary embolism (HCC)    Right ureteral stone    Urgency of urination    Wears glasses       Past Surgical History:  Procedure Laterality Date   APPENDECTOMY  1950   CARPECTOMY WITH RADIAL STYLOIDECTOMY Right 09/12/2010   AND NEURECTOMY  OF WRIST (STAGE III [place]OCK DISEASE)   CLOSED MANIPULATION POST TOTAL  KNEE ARTHROPLASTY  07/13/2006   COLONOSCOPY W/ POLYPECTOMY  2002   adenomatous; Dr Obie   epidural steroid injections     two rounds of injections; lumbar spine    EXTRACORPOREAL SHOCK  WAVE LITHOTRIPSY  X3   FACELIFT W/BLEPHAROPLASTY  2005   HYSTEROSCOPY WITH D & C  12/22/2000   implantable loop recorder removal  10/29/2020   MDT Reveal LINQ removed by Dr Kelsie   LOOP RECORDER INSERTION N/A 05/21/2017   Procedure: LOOP RECORDER INSERTION;  Surgeon: Kelsie Agent, MD;  [facility];  Service: Cardiovascular;  Laterality: N/A;   OPEN KNEE MENISECTOMY  1968   RIGHT URETEROSCOPIC STONE EXTRACTION / STENT PLACEMENT  08/21/2008   rotator cuff on right shoulder     TONSILLECTOMY  1950   TOTAL HIP ARTHROPLASTY Left 12/02/2022   Procedure: TOTAL HIP ARTHROPLASTY ANTERIOR APPROACH;  Surgeon: Ernie Cough, MD;  Location: [facility];  Service: Orthopedics;  Laterality: Left;   TOTAL KNEE ARTHROPLASTY Right 05/30/2006   TUBAL LIGATION     WISDOM TOOTH EXTRACTION      Family History  Problem Relation Age of Onset   Heart attack Mother 50   Emphysema Father        smoker   Colon polyps Father    Breast cancer Sister        late 61s   Cancer Paternal Grandfather        throat cancer   Esophageal cancer Paternal Grandfather    Heart attack Maternal Grandfather 56   Diabetes Neg Hx    Stroke Neg Hx    Colon cancer Neg Hx  Rectal cancer Neg Hx    Stomach cancer Neg Hx     Social History:  reports that she has never smoked. She has never used smokeless tobacco. She reports current alcohol  use of about 5.0 standard drinks of alcohol  per week. She reports that she does not use drugs.  BMI: Estimated body mass index is 26 kg/Sarah as calculated from the following:   Height as of 09/21/23: 5' 7 (1.702 Sarah).   Weight as of 09/21/23: 75.3 kg.  Lab Results  Component Value Date   ALBUMIN 3.8 10/24/2020   Diabetes: Patient does not have a diagnosis of diabetes.     Smoking Status:   reports that she has never smoked. She has never used smokeless tobacco.     No medications prior to admission.     Physical Exam: Left shoulder demonstrates painful and  profoundly restricted motion as noted at her recent office visits.  Globally decreased strength to manual muscle testing.  Imaging studies confirm changes consistent with chronic severe glenohumeral arthritis with bony deformity and rotator cuff dysfunction  Vitals     Assessment/Plan  Impression: Left shoulder osteoarthritis with severe rotator cuff dysfunction and bony deformity  Plan of Action: Procedure(s): REVERSE SHOULDER ARTHROPLASTY  Sarah Valdez Sarah Valdez 10/01/2023, 6:48 AM Contact # [phone]

## 2023-10-01 NOTE — Op Note (Signed)
 10/01/2023  1:56 PM  PATIENT:   Sarah Valdez  78 y.o. female  PRE-OPERATIVE DIAGNOSIS:  Left shoulder osteoarthritis with severe rotator cuff dysfunction  POST-OPERATIVE DIAGNOSIS: Same  PROCEDURE: Left shoulder reverse arthroplasty lysing a press-fit size 9 Arthrex stem with a neutral metathesis, +3 constrained polyethylene insert, 36/+4 glenosphere and a small/+2 baseplate  SURGEON:  Gatlin Kittell, Franky BATTLE M.D.  ASSISTANTS: Randine Ricks, PA-C  Randine Ricks, PA-C was utilized as an geophysicist/field seismologist throughout this case, essential for help with positioning the patient, positioning extremity, tissue manipulation, implantation of the prosthesis, suture management, wound closure, and intraoperative decision-making.  ANESTHESIA:   General Endotracheal and interscalene block with Exparel   EBL: 150 cc  SPECIMEN: None  Drains: None   PATIENT DISPOSITION:  PACU - hemodynamically stable.    PLAN OF CARE: Discharge to home after PACU  Brief history:  Patient is a 78 year old female with chronic and progressively increasing left shoulder pain related to severe osteoarthritis with associated rotator cuff dysfunction resulting in profound loss of motion pain weakness and functional limitations.  She is brought to the operating this time for planned left shoulder reverse arthroplasty.  Preoperatively, I counseled the patient regarding treatment options and risks versus benefits thereof.  Possible surgical complications were all reviewed including potential for bleeding, infection, neurovascular injury, persistent pain, loss of motion, anesthetic complication, failure of the implant, and possible need for additional surgery. They understand and accept and agrees with our planned procedure.   Procedure in detail:  After undergoing routine preop evaluation the patient received prophylactic antibiotics and interscalene block with Exparel  was established in the holding area by the anesthesia department.   Subsequently placed spine on the operating table and underwent the smooth induction of a general endotracheal anesthesia.  Placed into the beachchair position and appropriately padded and protected.  Left shoulder girdle region was sterilely prepped and draped in standard fashion.  Timeout was called.  A deltopectoral approach to the left shoulder is made an approximately 8 cm incision.  Skin flaps elevated dissection carried deeply and the deltopectoral interval was then developed from proximal to distal with the vein taken laterally.  Adhesions divided beneath the deltoid and the conjoined tendon was mobilized and retracted medially.  The long head biceps tendon was then tenodesed at the upper border the pectoralis major tendon with the proximal segment unroofed and excised.  The superior rotator cuff was then split from the apex of the bicipital groove to the base of the coracoid and the subscap was elevated from the lesser tuberosity using electrocautery and the free margin was tagged with a pair of grasping suture tape sutures.  Capsular attachments were then divided from the anterior and inferior margins of the humeral neck allowing delivery of the humeral head through the wound.  An extra medullary guide was then used to outline the proposed humeral head resection which we performed with an oscillating saw at approximate 20 degrees of retroversion.  Marginal osteophytes were removed with a rondure and a metal cap was then placed over the cut proximal humeral surface.  The glenoid was then exposed and a circumferential labral resection performed.  Guidepin was then directed into the center of the glenoid and the glenoid was then reamed with the central followed by the peripheral reamer to a stable subchondral bony bed.  Preparation completed using the central drill and tapped for a 30 mm lag screw.  Our baseplate was then assembled and inserted with vancomycin  powder applied to the threads  of the lag screw and  excellent fixation was achieved.  The peripheral locking screws were all then placed using standard technique with excellent fixation.  A 36/+4 glenosphere was then impacted onto the baseplate and a central locking screw was placed.  Attention returned to the humeral metaphysis where the canal was opened by hand reaming and we broached up to a size 9 stem at approximately 20 degrees of retroversion.  A neutral metaphyseal reaming guide was then used to repair the metaphysis.  A trial implant was then placed and trial reduction showed good motion stability and soft tissue balance.  This point the trial was removed.  The canal was irrigated cleaned and dried.  Vancomycin  powder applied into the canal.  Our final implant was then assembled and inserted with excellent fixation.  A series of trial reductions were then performed and ultimately felt that the +3 poly gave us  the best motion stability and soft tissue balance.  A +3 constrained poly was then impacted on the implant after was cleaned and dried.  A final reduction showed excellent motor stability and soft tissue balance all much to our satisfaction.  The wound was copious irrigated.  Final hemostasis was obtained.  The balance of vancomycin  powder was then sprayed liberally throughout the deep soft tissue planes.  The subscapularis was confirmed to have good elasticity and was repaired back to the eyelets on the collar of the implant using the previously placed suture tape sutures.  Deltopectoral interval reapproximated with a series of figure-of-eight number Vicryl sutures.  2-0 Monocryl used to close the subcu layer and intracuticular 3-0 Monocryl used to close the skin followed by Steri-Strips and Aquacel dressing.  The left arm was placed into a sling and the patient was awakened, extubated, and taken to the prep room in stable condition.  Franky CHRISTELLA Pointer MD   Contact # [phone]

## 2023-10-01 NOTE — Anesthesia Procedure Notes (Addendum)
 Procedure Name: Intubation Date/Time: 10/01/2023 12:51 PM  Performed by: Harl Armida PARAS, CRNAPre-anesthesia Checklist: Patient identified, Emergency Drugs available, Suction available and Patient being monitored Patient Re-evaluated:Patient Re-evaluated prior to induction Oxygen Delivery Method: Circle system utilized Preoxygenation: Pre-oxygenation with 100% oxygen Induction Type: IV induction Ventilation: Mask ventilation without difficulty Laryngoscope Size: Mac, 3 and Glidescope Grade View: Grade I Tube type: Oral Tube size: 7.5 mm Airway Equipment and Method: Stylet and Video-laryngoscopy Placement Confirmation: ETT inserted through vocal cords under direct vision, positive ETCO2 and breath sounds checked- equal and bilateral Secured at: 22 cm Tube secured with: Tape Dental Injury: Teeth and Oropharynx as per pre-operative assessment  Comments: Intubated by paramedic on OR rotation for glidescope intubations. Atramatic, smooth intubation.

## 2023-10-01 NOTE — Discharge Instructions (Signed)

## 2023-10-01 NOTE — Transfer of Care (Signed)
 Immediate Anesthesia Transfer of Care Note  Patient: Sarah Valdez  Procedure(s) Performed: REVERSE SHOULDER ARTHROPLASTY (Left: Shoulder)  Patient Location: PACU  Anesthesia Type:General and Regional  Level of Consciousness: awake, alert , and oriented  Airway & Oxygen Therapy: Patient Spontanous Breathing and Patient connected to face mask oxygen  Post-op Assessment: Report given to RN and Post -op Vital signs reviewed and stable  Post vital signs: Reviewed and stable  Last Vitals:  Vitals Value Taken Time  BP 159/92   Temp    Pulse 100 10/01/23 1416  Resp 14 10/01/23 1416  SpO2 94 % 10/01/23 1416  Vitals shown include unfiled device data.  Last Pain:  Vitals:   10/01/23 1220  TempSrc:   PainSc: 0-No pain      Patients Stated Pain Goal: 6 (10/01/23 1027)  Complications: No notable events documented.

## 2023-10-01 NOTE — Evaluation (Signed)
 Occupational Therapy Evaluation Patient Details Name: Sarah Valdez MRN: [id number] DOB: 09/05/1945 Today's Date: 10/01/2023   History of Present Illness 78 yo F s/p TSA.  PMH includes: HTN, CHF, CVA, HOH, L THA.   Clinical Impression   Precautions per MD: Shoulder FF 0-60; ER 0-20; Abd 0-45. ROM for ADL purposes only; AROM elbow/wrist/hand; OK for lap slides and pendulums; Loosen sling from neck when arm is supported in sitting precautions.  If sitting in controlled environment, ok to come out of sling to give neck a break. Please sleep in it to protect until follow up in office. OK to use operative arm for feeding, hygiene and ADLs. Patient verbalized understanding.  Handouts for home use issued, all questions answered.  Patient able to perform mobility and ADL with assist as needed given L UE surgery.  No further OT needs in the acute setting.  Follow up as prescribed by MD.        If plan is discharge home, recommend the following: Assist for transportation;A little help with bathing/dressing/bathroom    Functional Status Assessment  Patient has had a recent decline in their functional status and demonstrates the ability to make significant improvements in function in a reasonable and predictable amount of time.  Equipment Recommendations  None recommended by OT    Recommendations for Other Services       Precautions / Restrictions Precautions Precautions: Shoulder Type of Shoulder Precautions: see note Shoulder Interventions: Shoulder sling/immobilizer Precaution Booklet Issued: Yes (comment) Precaution Comments: verbalized understanding Restrictions Weight Bearing Restrictions Per Provider Order: Yes LUE Weight Bearing Per Provider Order: Non weight bearing Other Position/Activity Restrictions: no pushing and no pulling      Mobility Bed Mobility                    Transfers Overall transfer level: Needs assistance   Transfers: Sit to/from Stand Sit to  Stand: Supervision                  Balance Overall balance assessment: Mild deficits observed, not formally tested                                         ADL either performed or assessed with clinical judgement   ADL                   Upper Body Dressing : Moderate assistance;Sitting;Standing   Lower Body Dressing: Moderate assistance;Sit to/from stand;Minimal assistance   Toilet Transfer: Supervision/safety;Ambulation                   Vision Baseline Vision/History: 1 Wears glasses Patient Visual Report: No change from baseline       Perception Perception: Not tested       Praxis Praxis: Not tested       Pertinent Vitals/Pain Pain Assessment Pain Assessment: Faces Faces Pain Scale: No hurt Pain Location: block in place Pain Intervention(s): Monitored during session     Extremity/Trunk Assessment Upper Extremity Assessment Upper Extremity Assessment: LUE deficits/detail LUE Deficits / Details: s/p TSA LUE Sensation: decreased light touch LUE Coordination: decreased fine motor;decreased gross motor   Lower Extremity Assessment Lower Extremity Assessment: Overall WFL for tasks assessed   Cervical / Trunk Assessment Cervical / Trunk Assessment: Normal   Communication Communication Communication: No apparent difficulties   Cognition Arousal: Alert Behavior During Therapy: [facility] for  tasks assessed/performed Overall Cognitive Status: Within Functional Limits for tasks assessed                                       General Comments   vSS               Home Living Family/patient expects to be discharged to:: Private residence Living Arrangements: Spouse/significant other Available Help at Discharge: Family Type of Home: House Home Access: Stairs to enter [employer] of Steps: 2 Entrance Stairs-Rails: Left Home Layout: Multi-level;Able to live on main level with bedroom/bathroom      Bathroom Shower/Tub: Producer, Television/film/video: Handicapped height Bathroom Accessibility: Yes   Home Equipment: [employer]ant (2 wheels);Cane - single point          Prior Functioning/Environment Prior Level of Function : Independent/Modified Independent                        OT Problem List: Impaired sensation      OT Treatment/Interventions:      OT Goals(Current goals can be found in the care plan section) Acute Rehab OT Goals Patient Stated Goal: Return home OT Goal Formulation: With patient Time For Goal Achievement: 10/09/23 Potential to Achieve Goals: Good  OT Frequency:      Co-evaluation              AM-PAC OT 6 Clicks Daily Activity     Outcome Measure Help from another person eating meals?: A Little Help from another person taking care of personal grooming?: A Little Help from another person toileting, which includes using toliet, bedpan, or urinal?: A Little Help from another person bathing (including washing, rinsing, drying)?: A Lot Help from another person to put on and taking off regular upper body clothing?: A Lot Help from another person to put on and taking off regular lower body clothing?: A Lot 6 Click Score: 15   End of Session Nurse Communication: Mobility status  Activity Tolerance: Patient tolerated treatment well Patient left: in chair;with call bell/phone within reach;with family/visitor present  OT Visit Diagnosis: Unsteadiness on feet (R26.81)                Time: 1610-1640 OT Time Calculation (min): 30 min Charges:  OT General Charges $OT Visit: 1 Visit OT Evaluation $OT Eval Moderate Complexity: 1 Mod OT Treatments $Self Care/Home Management : 8-22 mins  10/01/2023  RP, OTR/L  Acute Rehabilitation Services  Office:  [phone]   Charlie JONETTA Halsted 10/01/2023, 4:53 PM

## 2023-10-01 NOTE — Anesthesia Procedure Notes (Signed)
 Anesthesia Regional Block: Interscalene brachial plexus block   Pre-Anesthetic Checklist: , timeout performed,  Correct Patient, Correct Site, Correct Laterality,  Correct Procedure, Correct Position, site marked,  Risks and benefits discussed,  Surgical consent,  Pre-op evaluation,  At surgeon's request and post-op pain management  Laterality: Left  Prep: chloraprep       Needles:  Injection technique: Single-shot  Needle Type: Echogenic Stimulator Needle     Needle Length: 5cm  Needle Gauge: 22     Additional Needles:   Procedures:, nerve stimulator,,, ultrasound used (permanent image in chart),,     Nerve Stimulator or Paresthesia:  Response: hand, 0.45 mA  Additional Responses:   Narrative:  Start time: 10/01/2023 12:10 PM End time: 10/01/2023 12:15 PM Injection made incrementally with aspirations every 5 mL.  Performed by: Personally  Anesthesiologist: Mallory Manus, MD  Additional Notes: Functioning IV was confirmed and monitors were applied.  A 50mm 22ga Arrow echogenic stimulator needle was used. Sterile prep and drape,hand hygiene and sterile gloves were used. Ultrasound guidance: relevant anatomy identified, needle position confirmed, local anesthetic spread visualized around nerve(s)., vascular puncture avoided.  Image printed for medical record. Negative aspiration and negative test dose prior to incremental administration of local anesthetic. The patient tolerated the procedure well.

## 2023-10-02 ENCOUNTER — Encounter (HOSPITAL_COMMUNITY): Payer: Self-pay | Admitting: Orthopedic Surgery

## 2023-10-02 NOTE — Anesthesia Postprocedure Evaluation (Signed)
 Anesthesia Post Note  Patient: Sarah Valdez  Procedure(s) Performed: REVERSE SHOULDER ARTHROPLASTY (Left: Shoulder)     Anesthesia Type: Regional Anesthetic complications: no   No notable events documented.  Last Vitals:  Vitals:   10/01/23 1545 10/01/23 1600  BP: (!) 150/89 (!) 150/87  Pulse: 75 73  Resp: 15 14  Temp: (!) 36.4 C 36.4 C  SpO2: 94% 93%    Last Pain:  Vitals:   10/01/23 1600  TempSrc:   PainSc: 0-No pain                 Jeslyn Amsler

## 2023-10-14 DIAGNOSIS — Z96612 Presence of left artificial shoulder joint: Secondary | ICD-10-CM | POA: Diagnosis not present

## 2023-10-14 DIAGNOSIS — Z4731 Aftercare following explantation of shoulder joint prosthesis: Secondary | ICD-10-CM | POA: Diagnosis not present

## 2023-11-02 DIAGNOSIS — M25612 Stiffness of left shoulder, not elsewhere classified: Secondary | ICD-10-CM | POA: Diagnosis not present

## 2023-11-02 DIAGNOSIS — M25512 Pain in left shoulder: Secondary | ICD-10-CM | POA: Diagnosis not present

## 2023-11-09 DIAGNOSIS — M25612 Stiffness of left shoulder, not elsewhere classified: Secondary | ICD-10-CM | POA: Diagnosis not present

## 2023-11-09 DIAGNOSIS — M25512 Pain in left shoulder: Secondary | ICD-10-CM | POA: Diagnosis not present

## 2023-11-11 DIAGNOSIS — M25512 Pain in left shoulder: Secondary | ICD-10-CM | POA: Diagnosis not present

## 2023-11-11 DIAGNOSIS — M25612 Stiffness of left shoulder, not elsewhere classified: Secondary | ICD-10-CM | POA: Diagnosis not present

## 2023-11-12 DIAGNOSIS — M810 Age-related osteoporosis without current pathological fracture: Secondary | ICD-10-CM | POA: Diagnosis not present

## 2023-11-12 DIAGNOSIS — R030 Elevated blood-pressure reading, without diagnosis of hypertension: Secondary | ICD-10-CM | POA: Diagnosis not present

## 2023-11-12 DIAGNOSIS — R7989 Other specified abnormal findings of blood chemistry: Secondary | ICD-10-CM | POA: Diagnosis not present

## 2023-11-12 DIAGNOSIS — Z1212 Encounter for screening for malignant neoplasm of rectum: Secondary | ICD-10-CM | POA: Diagnosis not present

## 2023-11-12 DIAGNOSIS — E7849 Other hyperlipidemia: Secondary | ICD-10-CM | POA: Diagnosis not present

## 2023-11-12 DIAGNOSIS — E559 Vitamin D deficiency, unspecified: Secondary | ICD-10-CM | POA: Diagnosis not present

## 2023-11-12 DIAGNOSIS — E785 Hyperlipidemia, unspecified: Secondary | ICD-10-CM | POA: Diagnosis not present

## 2023-11-16 DIAGNOSIS — M25612 Stiffness of left shoulder, not elsewhere classified: Secondary | ICD-10-CM | POA: Diagnosis not present

## 2023-11-16 DIAGNOSIS — M25512 Pain in left shoulder: Secondary | ICD-10-CM | POA: Diagnosis not present

## 2023-11-19 DIAGNOSIS — Z95818 Presence of other cardiac implants and grafts: Secondary | ICD-10-CM | POA: Diagnosis not present

## 2023-11-19 DIAGNOSIS — I6990 Unspecified sequelae of unspecified cerebrovascular disease: Secondary | ICD-10-CM | POA: Diagnosis not present

## 2023-11-19 DIAGNOSIS — R82998 Other abnormal findings in urine: Secondary | ICD-10-CM | POA: Diagnosis not present

## 2023-11-19 DIAGNOSIS — Z7901 Long term (current) use of anticoagulants: Secondary | ICD-10-CM | POA: Diagnosis not present

## 2023-11-19 DIAGNOSIS — I878 Other specified disorders of veins: Secondary | ICD-10-CM | POA: Diagnosis not present

## 2023-11-19 DIAGNOSIS — Z0000 Encounter for general adult medical examination without abnormal findings: Secondary | ICD-10-CM | POA: Diagnosis not present

## 2023-11-19 DIAGNOSIS — R600 Localized edema: Secondary | ICD-10-CM | POA: Diagnosis not present

## 2023-11-19 DIAGNOSIS — E785 Hyperlipidemia, unspecified: Secondary | ICD-10-CM | POA: Diagnosis not present

## 2023-11-19 DIAGNOSIS — Z86718 Personal history of other venous thrombosis and embolism: Secondary | ICD-10-CM | POA: Diagnosis not present

## 2023-11-19 DIAGNOSIS — R002 Palpitations: Secondary | ICD-10-CM | POA: Diagnosis not present

## 2023-11-20 DIAGNOSIS — M25612 Stiffness of left shoulder, not elsewhere classified: Secondary | ICD-10-CM | POA: Diagnosis not present

## 2023-11-20 DIAGNOSIS — M25512 Pain in left shoulder: Secondary | ICD-10-CM | POA: Diagnosis not present

## 2023-11-26 DIAGNOSIS — M25512 Pain in left shoulder: Secondary | ICD-10-CM | POA: Diagnosis not present

## 2023-11-26 DIAGNOSIS — M25612 Stiffness of left shoulder, not elsewhere classified: Secondary | ICD-10-CM | POA: Diagnosis not present

## 2023-11-30 DIAGNOSIS — M25612 Stiffness of left shoulder, not elsewhere classified: Secondary | ICD-10-CM | POA: Diagnosis not present

## 2023-11-30 DIAGNOSIS — M25512 Pain in left shoulder: Secondary | ICD-10-CM | POA: Diagnosis not present

## 2023-12-03 DIAGNOSIS — M25512 Pain in left shoulder: Secondary | ICD-10-CM | POA: Diagnosis not present

## 2023-12-03 DIAGNOSIS — M25612 Stiffness of left shoulder, not elsewhere classified: Secondary | ICD-10-CM | POA: Diagnosis not present

## 2023-12-09 DIAGNOSIS — M25512 Pain in left shoulder: Secondary | ICD-10-CM | POA: Diagnosis not present

## 2023-12-09 DIAGNOSIS — M25612 Stiffness of left shoulder, not elsewhere classified: Secondary | ICD-10-CM | POA: Diagnosis not present

## 2023-12-14 DIAGNOSIS — M25512 Pain in left shoulder: Secondary | ICD-10-CM | POA: Diagnosis not present

## 2023-12-14 DIAGNOSIS — M25612 Stiffness of left shoulder, not elsewhere classified: Secondary | ICD-10-CM | POA: Diagnosis not present

## 2024-01-04 DIAGNOSIS — M51361 Other intervertebral disc degeneration, lumbar region with lower extremity pain only: Secondary | ICD-10-CM | POA: Diagnosis not present

## 2024-01-04 DIAGNOSIS — Z96642 Presence of left artificial hip joint: Secondary | ICD-10-CM | POA: Diagnosis not present

## 2024-01-04 DIAGNOSIS — M4150 Other secondary scoliosis, site unspecified: Secondary | ICD-10-CM | POA: Diagnosis not present

## 2024-01-04 DIAGNOSIS — Z96612 Presence of left artificial shoulder joint: Secondary | ICD-10-CM | POA: Diagnosis not present

## 2024-01-04 DIAGNOSIS — Z5189 Encounter for other specified aftercare: Secondary | ICD-10-CM | POA: Diagnosis not present

## 2024-01-04 DIAGNOSIS — M5459 Other low back pain: Secondary | ICD-10-CM | POA: Diagnosis not present

## 2024-01-06 ENCOUNTER — Other Ambulatory Visit: Payer: Self-pay | Admitting: Internal Medicine

## 2024-01-06 DIAGNOSIS — Z1231 Encounter for screening mammogram for malignant neoplasm of breast: Secondary | ICD-10-CM

## 2024-01-06 DIAGNOSIS — M25512 Pain in left shoulder: Secondary | ICD-10-CM | POA: Diagnosis not present

## 2024-01-06 DIAGNOSIS — M25612 Stiffness of left shoulder, not elsewhere classified: Secondary | ICD-10-CM | POA: Diagnosis not present

## 2024-01-08 DIAGNOSIS — M25612 Stiffness of left shoulder, not elsewhere classified: Secondary | ICD-10-CM | POA: Diagnosis not present

## 2024-01-08 DIAGNOSIS — M25512 Pain in left shoulder: Secondary | ICD-10-CM | POA: Diagnosis not present

## 2024-01-12 DIAGNOSIS — M810 Age-related osteoporosis without current pathological fracture: Secondary | ICD-10-CM | POA: Diagnosis not present

## 2024-01-13 DIAGNOSIS — M25512 Pain in left shoulder: Secondary | ICD-10-CM | POA: Diagnosis not present

## 2024-01-13 DIAGNOSIS — M25612 Stiffness of left shoulder, not elsewhere classified: Secondary | ICD-10-CM | POA: Diagnosis not present

## 2024-01-15 ENCOUNTER — Ambulatory Visit
Admission: RE | Admit: 2024-01-15 | Discharge: 2024-01-15 | Disposition: A | Discharge status: Home / Self Care | Source: Ambulatory Visit | Attending: Internal Medicine | Admitting: Internal Medicine

## 2024-01-15 DIAGNOSIS — Z1231 Encounter for screening mammogram for malignant neoplasm of breast: Secondary | ICD-10-CM

## 2024-01-21 ENCOUNTER — Other Ambulatory Visit: Payer: Self-pay | Admitting: Medical Genetics

## 2024-01-26 DIAGNOSIS — M4316 Spondylolisthesis, lumbar region: Secondary | ICD-10-CM | POA: Diagnosis not present

## 2024-01-26 DIAGNOSIS — M4120 Other idiopathic scoliosis, site unspecified: Secondary | ICD-10-CM | POA: Diagnosis not present

## 2024-01-27 ENCOUNTER — Ambulatory Visit
Admission: RE | Admit: 2024-01-27 | Discharge: 2024-01-27 | Disposition: A | Discharge status: Home / Self Care | Source: Ambulatory Visit | Attending: Internal Medicine | Admitting: Internal Medicine

## 2024-01-27 DIAGNOSIS — M25512 Pain in left shoulder: Secondary | ICD-10-CM | POA: Diagnosis not present

## 2024-01-27 DIAGNOSIS — Z1231 Encounter for screening mammogram for malignant neoplasm of breast: Secondary | ICD-10-CM | POA: Diagnosis not present

## 2024-01-27 DIAGNOSIS — M25612 Stiffness of left shoulder, not elsewhere classified: Secondary | ICD-10-CM | POA: Diagnosis not present

## 2024-01-29 DIAGNOSIS — M25612 Stiffness of left shoulder, not elsewhere classified: Secondary | ICD-10-CM | POA: Diagnosis not present

## 2024-01-29 DIAGNOSIS — M25512 Pain in left shoulder: Secondary | ICD-10-CM | POA: Diagnosis not present

## 2024-02-03 DIAGNOSIS — M4316 Spondylolisthesis, lumbar region: Secondary | ICD-10-CM | POA: Diagnosis not present

## 2024-02-10 DIAGNOSIS — M4807 Spinal stenosis, lumbosacral region: Secondary | ICD-10-CM | POA: Diagnosis not present

## 2024-02-10 DIAGNOSIS — M4316 Spondylolisthesis, lumbar region: Secondary | ICD-10-CM | POA: Diagnosis not present

## 2024-02-10 DIAGNOSIS — M419 Scoliosis, unspecified: Secondary | ICD-10-CM | POA: Diagnosis not present

## 2024-02-10 DIAGNOSIS — M47816 Spondylosis without myelopathy or radiculopathy, lumbar region: Secondary | ICD-10-CM | POA: Diagnosis not present

## 2024-02-10 DIAGNOSIS — M4856XA Collapsed vertebra, not elsewhere classified, lumbar region, initial encounter for fracture: Secondary | ICD-10-CM | POA: Diagnosis not present

## 2024-02-11 DIAGNOSIS — M4316 Spondylolisthesis, lumbar region: Secondary | ICD-10-CM | POA: Diagnosis not present

## 2024-02-18 DIAGNOSIS — M25512 Pain in left shoulder: Secondary | ICD-10-CM | POA: Diagnosis not present

## 2024-02-18 DIAGNOSIS — M25612 Stiffness of left shoulder, not elsewhere classified: Secondary | ICD-10-CM | POA: Diagnosis not present

## 2024-02-18 DIAGNOSIS — Z6826 Body mass index (BMI) 26.0-26.9, adult: Secondary | ICD-10-CM | POA: Diagnosis not present

## 2024-02-18 DIAGNOSIS — M4316 Spondylolisthesis, lumbar region: Secondary | ICD-10-CM | POA: Diagnosis not present

## 2024-03-10 ENCOUNTER — Other Ambulatory Visit

## 2024-03-10 DIAGNOSIS — Z006 Encounter for examination for normal comparison and control in clinical research program: Secondary | ICD-10-CM

## 2024-03-10 DIAGNOSIS — M25612 Stiffness of left shoulder, not elsewhere classified: Secondary | ICD-10-CM | POA: Diagnosis not present

## 2024-03-10 DIAGNOSIS — M25512 Pain in left shoulder: Secondary | ICD-10-CM | POA: Diagnosis not present

## 2024-03-15 DIAGNOSIS — M47816 Spondylosis without myelopathy or radiculopathy, lumbar region: Secondary | ICD-10-CM | POA: Diagnosis not present

## 2024-03-21 LAB — GENECONNECT MOLECULAR SCREEN: Genetic Analysis Overall Interpretation: NEGATIVE

## 2024-04-11 DIAGNOSIS — M25361 Other instability, right knee: Secondary | ICD-10-CM | POA: Diagnosis not present

## 2024-04-12 DIAGNOSIS — M47816 Spondylosis without myelopathy or radiculopathy, lumbar region: Secondary | ICD-10-CM | POA: Diagnosis not present

## 2024-04-21 ENCOUNTER — Encounter: Payer: Self-pay | Admitting: *Deleted

## 2024-04-22 ENCOUNTER — Encounter: Payer: Self-pay | Admitting: Neurology

## 2024-04-22 ENCOUNTER — Ambulatory Visit (INDEPENDENT_AMBULATORY_CARE_PROVIDER_SITE_OTHER): Admitting: Neurology

## 2024-04-22 VITALS — BP 153/107 | HR 84 | Ht 67.0 in | Wt 140.2 lb

## 2024-04-22 DIAGNOSIS — R2689 Other abnormalities of gait and mobility: Secondary | ICD-10-CM

## 2024-04-22 DIAGNOSIS — R42 Dizziness and giddiness: Secondary | ICD-10-CM | POA: Diagnosis not present

## 2024-04-22 DIAGNOSIS — F09 Unspecified mental disorder due to known physiological condition: Secondary | ICD-10-CM | POA: Diagnosis not present

## 2024-04-22 DIAGNOSIS — R296 Repeated falls: Secondary | ICD-10-CM

## 2024-04-22 NOTE — Progress Notes (Signed)
 [facility]    rovider:  Dr Ines Referring Provider: Onita Rush, MD Primary Care Physician:  Onita Rush, MD CC:  Stroke  04/22/2024: 78 year old patient with have seen in the past for dizziness, here again for dizziness. has Vitamin D  deficiency; Dyslipidemia; Allergic rhinitis, cause unspecified; COUGH VARIANT ASTHMA; Diverticulosis of large intestine; DEGENERATIVE DISC DISEASE; Wrist osteonecrosis; PALPITATIONS; SKIN CANCER, HX OF; History of colonic polyps; NEPHROLITHIASIS, HX OF; Osteopenia; Back spasm; Abnormal CT scan, liver; Hepatic steatosis; Gallstones; Pancreatic cyst; Cryptogenic stroke (HCC); Bilateral sensorineural hearing loss; Imbalance; Chronic congestive heart failure (HCC); Right ureteral stone; Bilateral pulmonary embolism (HCC); Essential hypertension; Supraventricular tachycardia (HCC); and S/P total left hip arthroplasty on their problem list.  She has been having bouts of dizziness/chronic dizziness since at least 2019. The dizziness is still the same as before, when she woke up with morning she could feel in her head this sensation, she took meclizine . The room is not spinning, she feels her brain is spinning, it doesn't affect her walking, all going on in her head, her husband provides mouch information, he tates her short term memory is not as good. He has noticed a difference in the last 6 months. She also has this complaint in 2022. She is nodding off during the day. She states she has had some health issues, she has had a hip and shoulder replacement in the recent past. In the 6 month period, he feels it is worse, she mentioned this in 2022. In the last 4-5 months she asks the same question and repeats things, her short-term memory is not good, they see a change in the short term meory. She has fallen 4x in the last 4 months, she came in the kitchen and tripped over the threshold and fel, in the rough areas in the brick . She has recall difficulty.  She  feels like she needs to shake her head to make it better. She has low back pain, she is gojng to have an ablation,scoliosis, not in her legs, she cannot describe the dizziness.  Meclizine .Patient complains of symptoms per HPI as well as the following symptoms: knee pain, chronic medical conditions . Pertinent negatives and positives per HPI. All others negative   Patient complains of symptoms per HPI as well as the following symptoms: memory loss, imbalance . Pertinent negatives and positives per HPI. All others negative  MRI brain 11/06/2017:FINDINGS: reviewed images and agree The brain parenchyma shows mild age-appropriate changes of chronic microvascular ischemia.  No structural lesion, tumor or infarcts are noted.  There are mild changes of chronic paranasal sinusitis.No abnormal lesions are seen on diffusion-weighted views to suggest acute ischemia. The cortical sulci, fissures and cisterns are normal in size and appearance. Lateral, third and fourth ventricle are normal in size and appearance. No extra-axial fluid collections are seen. No evidence of mass effect or midline shift.  No abnormal lesions are seen on post contrast views.  On sagittal views the posterior fossa, pituitary gland and corpus callosum are unremarkable. No evidence of intracranial hemorrhage on gradient-echo views. The orbits and their contents, paranasal sinuses and calvarium are unremarkable.  Intracranial flow voids are present.   IMPRESSION: Unremarkable MRI scan of the brain showing only mild age-appropriate changes of chronic microvascular ischemia.  No acute abnormalities noted.   Patient complains of symptoms per HPI as well as the following symptoms: knee pain . Pertinent negatives and positives per HPI. All others negative    Interval history Jan 07, 2021:  This is a patient is seen in the past for stroke, dizziness, today she is here for new chief complaint of memory loss, she had a pulmonary embolism and her  stroke, she is on Eliquis . We discussed cryptogenic stroke. We discussed any relation between her DVT and cryptogenic embolic stroke, we discussed pfo, she has some trouble with recall but she is not too concerned, we discussed normal cognitive aging. She sat on the couch a lot during covid and she was having injections for lumbar issues, maybe sedentary lifestyle? She had just had an injection in her low back, but she walks a lot. She had shortness of breath with the pfo and she couldn't even walk one of the aquares.  Interval history 09/07/2018: She still has bouts of dizziness. Ativan doesn;t really help. Last one was before christmas for several weeks. Epley maneuvers don't help. She doesn't drink enough water . It can happen anytime. SHe feels dizzy, out of sorts, No perception of movement, she feels lightheaded, sitting down does not make it better. She knows it is there, she can still get up and function, no falls. No problems with loop recorder. Her BP meds were adjusted and decreased and still having the episodes. ENT appointment in the past was normal. Her blood pressure is always low. Discussed vestibular migraines.   Interval history 10/26/2017: She has intermittent dizziness.  She went to the ED. It was late morning and waited for 8 hours. Need to repeat MRI brain. Vertigo was going on for a week, it was more dizziness, new symptoms. Given previous cryptogenic stroke and questionable white matter changes need repeat MRI brain   Interval history 04/22/2017: Patient is here with her son for the first time. Spent an extended visit reviewing patient's clinical history, reviewing images of initial MRI and most recent MRI of the brain, reviewing echocardiogram and vascular imaging, discussing next and possibility of atrial fibrillation or other etiologies of clot, discussed stroke, treatment for atrial fibrillation and answered all questions.   Interval history 03/05/2017: Patient is here for follow-up  of subacute strokes that were seen on MRI of the brain. She was started on aspirin 325 as well as hemoglobin A1c, she follows with PCP for cholesterol and advised LDL goal is less than 70. CTA of the head showed no intracranial or extracranial stenosis or occlusion of significance. Echocardiogram of the heart showed no PFO or thrombus.  Cardiac telemetry did not show atrial fibrillation.   HPI:  Sarah Valdez is a 78 y.o. female here as a referral from Dr. Onita for stroke. She has a history of palpitations. 10 years ago she had extensive palpitations. MRi results showed subacute strokes. She has a history of supraventricular tacycardia. No history of afib. PMHx OA, HLD, She was not on anti-platelet prior to dizziness. Dizziness since September, waxes and wanes, sometimes it is room spinning or imbalance. Currently she is fine but a week ago it was more imbalance when standing. She had a fall at [place], she was rushing and she fell and hit her head on th left side. She bit her lip. Otherwise she can;t remember any event that may have happened in the last several months coinciding with the MRI results. She tends to walk and catches her toe and has falls. She has had vestibular therapy but never for gait abnormality or falls. No other focal neurologic deficits, associated symptoms, inciting events or modifiable factors.   Reviewed notes, labs and imaging from outside physicians, which showed:  Reviewed ENT notes. She presented with a chief complaint of dizziness and hearing loss. This started last fall when she started feeling imbalance and standing. There has been some room spinning. Onset was fairly sudden. PCP prescribed meclizine . The feeling comes and goes and goes away for a few weeks and comes back for a few weeks. Meclizine  is helpful. The feeling is constant even when not moving. Head movements do not affect the feeling. She has white noise all the time that worsens. There is an associated pressure  feeling in the head. When she stands the imbalance worsens. She has trouble hearing high-pitched sounds. She work with a physical therapist who did not find a problem. She has a remote history of migraines. Exam showed normal on exam, hearing test demonstrates symmetric sensorineural loss. Her imbalance symptoms not typical of an inner ear process. He recommended brain imaging.   Personally reviewed MRI images and agree with the following:   IMPRESSION: Four foci of low level restricted diffusion and contrast enhancement in the deep white matter of the left hemisphere probably consistent with late subacute small vessel infarctions. These could be embolic or watershed. No evidence of cortical infarction. The differential diagnosis does include demyelinating disease, but that is felt less likely.   Mild chronic small-vessel ischemic change of the hemispheric white matter in addition.   LDL 96 in 2016  Review of Systems: Patient complains of symptoms per HPI as well as the following symptoms: pulmonary embolus. Pertinent negatives and positives per HPI. All others negative    Social History   Socioeconomic History   Marital status: Married    Spouse name: Not on file   Number of children: 2   Years of education: Not on file   Highest education level: Bachelor's degree (e.g., BA, AB, BS)  Occupational History   Not on file  Tobacco Use   Smoking status: Never   Smokeless tobacco: Never  Vaping Use   Vaping status: Never Used  Substance and Sexual Activity   Alcohol  use: Yes    Alcohol /week: 5.0 standard drinks of alcohol     Types: 5 Glasses of wine per week    Comment: 5 or less .a week   Drug use: No   Sexual activity: Not on file  Other Topics Concern   Not on file  Social History Narrative   Lives at home in [place] with spouse.    Retired Optometrist.   Active in the community.   Right handed   Drinks 1 cup of caffeine daily   Social Drivers of Health   Financial  Resource Strain: Not on file  Food Insecurity: No Food Insecurity (12/02/2022)   Hunger Vital Sign    Worried About Running Out of Food in the Last Year: Never true    Ran Out of Food in the Last Year: Never true  Transportation Needs: No Transportation Needs (12/02/2022)   PRAPARE - Administrator, Civil Service (Medical): No    Lack of Transportation (Non-Medical): No  Physical Activity: Not on file  Stress: Not on file  Social Connections: Not on file  Intimate Partner Violence: Not At Risk (12/02/2022)   Humiliation, Afraid, Rape, and Kick questionnaire    Fear of Current or Ex-Partner: No    Emotionally Abused: No    Physically Abused: No    Sexually Abused: No    Family History  Problem Relation Age of Onset   Heart attack Mother 48   Emphysema Father  smoker   Colon polyps Father    Breast cancer Sister        late 57s   Cancer Paternal Grandfather        throat cancer   Esophageal cancer Paternal Grandfather    Heart attack Maternal Grandfather 50   Diabetes Neg Hx    Stroke Neg Hx    Colon cancer Neg Hx    Rectal cancer Neg Hx    Stomach cancer Neg Hx     Past Medical History:  Diagnosis Date   Allergy    Bilateral sensorineural hearing loss 11/06/2016   Cancer (HCC)    skin cancer - on back, forehead   Chronic congestive heart failure (HCC) 02/22/2018   Chronic low back pain    Cryptogenic stroke (HCC)    followed by Dr Ines   Diverticulosis of colon 2007   Dr Obie   Dyslipidemia    Dysrhythmia    Gallstones 01/20/2014   12/2013 multiple gallstones, largest 1.7 cm in diameter; asymptomatic    History of basal cell carcinoma excision    X3   History of colon polyps    NON-CANCEROUS   History of kidney stones    Hyperlipidemia    Lower extremity edema    Nephrolithiasis    Nocturia    OA (osteoarthritis of spine)    Osteopenia    Palpitations 11/24/2007   Qualifier: Diagnosis of  By: Tish MD, William     Palpitations     Pancreatic cyst 01/20/2014   01/03/14 8 x 11 mm nonaggressive cystic lesion the pancreas. Followup recommended in one year.    PONV (postoperative nausea and vomiting)    PSVT (paroxysmal supraventricular tachycardia) (HCC)    adenosine sensitive SVT previously documented   Pulmonary embolism (HCC)    Right ureteral stone    Scoliosis    Urgency of urination    Varicose vein of leg    Venous stasis    Vitamin D  deficiency    Wears glasses     Past Surgical History:  Procedure Laterality Date   APPENDECTOMY  1950   CARPECTOMY WITH RADIAL STYLOIDECTOMY Right 09/12/2010   AND NEURECTOMY  OF WRIST (STAGE III [place] DISEASE)   CLOSED MANIPULATION POST TOTAL  KNEE ARTHROPLASTY  07/13/2006   COLONOSCOPY W/ POLYPECTOMY  2002   adenomatous; Dr Obie   epidural steroid injections     two rounds of injections; lumbar spine    EXTRACORPOREAL SHOCK WAVE LITHOTRIPSY  X3   FACELIFT W/BLEPHAROPLASTY  2005   HYSTEROSCOPY WITH D & C  12/22/2000   implantable loop recorder removal  10/29/2020   MDT Reveal LINQ removed by Dr Kelsie   LOOP RECORDER INSERTION N/A 05/21/2017   Procedure: LOOP RECORDER INSERTION;  Surgeon: Kelsie Agent, MD;  Location: [facility];  Service: Cardiovascular;  Laterality: N/A;   OPEN KNEE MENISECTOMY  1968   REVERSE SHOULDER ARTHROPLASTY Left 10/01/2023   Procedure: REVERSE SHOULDER ARTHROPLASTY;  Surgeon: Melita Drivers, MD;  Location: [facility];  Service: Orthopedics;  Laterality: Left;    RIGHT URETEROSCOPIC STONE EXTRACTION / STENT PLACEMENT  08/21/2008   right wrist surgery     rotator cuff on right shoulder     TONSILLECTOMY  1950   TOTAL HIP ARTHROPLASTY Left 12/02/2022   Procedure: TOTAL HIP ARTHROPLASTY ANTERIOR APPROACH;  Surgeon: Ernie Cough, MD;  Location: [facility];  Service: Orthopedics;  Laterality: Left;   TOTAL KNEE ARTHROPLASTY Right 05/30/2006   TUBAL LIGATION  WISDOM TOOTH EXTRACTION      Current Outpatient Medications   Medication Sig Dispense Refill   acetaminophen  (TYLENOL ) 650 MG CR tablet Take 1,300 mg by mouth every 8 (eight) hours as needed for pain.     apixaban  (ELIQUIS ) 5 MG TABS tablet Take 5 mg by mouth in the morning and at bedtime.     Brimonidine Tartrate (LUMIFY) 0.025 % SOLN Place 1 drop into both eyes 2 (two) times daily.     cetirizine (ZYRTEC) 10 MG tablet Take 10 mg by mouth daily as needed for allergies.     cyclobenzaprine  (FLEXERIL ) 10 MG tablet Take 1 tablet (10 mg total) by mouth 3 (three) times daily as needed for muscle spasms. 30 tablet 1   fluticasone  (FLONASE ) 50 MCG/ACT nasal spray Place 1 spray into both nostrils daily as needed for allergies.     meclizine  (ANTIVERT ) 25 MG tablet Take 25 mg by mouth daily as needed.     ondansetron  (ZOFRAN ) 4 MG tablet Take 1 tablet (4 mg total) by mouth every 8 (eight) hours as needed for nausea or vomiting. 10 tablet 0   pravastatin  (PRAVACHOL ) 40 MG tablet Take 40 mg by mouth daily.     Cholecalciferol (VITAMIN D3) 250 MCG (10000 UT) capsule Take 10,000 Units by mouth daily.     nystatin-triamcinolone  ointment (MYCOLOG) Apply 1 Application topically 2 (two) times daily as needed (itching).     No current facility-administered medications for this visit.    Allergies as of 04/22/2024 - Review Complete 04/22/2024  Allergen Reaction Noted   Morphine Other (See Comments) 01/19/2007   Penicillamine Itching 11/19/2022   Penicillins Hives 01/19/2007   Levofloxacin Other (See Comments)     Vitals: BP (!) 153/107 (BP Location: Left Arm, Patient Position: Sitting, Cuff Size: Normal)   Pulse 84   Ht 5' 7 (1.702 m)   Wt 140 lb 3.2 oz (63.6 kg)   BMI 21.96 kg/m  Last Weight:  Wt Readings from Last 1 Encounters:  04/22/24 140 lb 3.2 oz (63.6 kg)   Last Height:   Ht Readings from Last 1 Encounters:  04/22/24 5' 7 (1.702 m)   Physical exam: Exam: Gen: NAD, conversant, well nourised, well groomed                     CV: RRR, no MRG.  No Carotid Bruits. No peripheral edema, warm, nontender Eyes: Conjunctivae clear without exudates or hemorrhage  Neuro: Detailed Neurologic Exam  Speech:    Speech is normal; fluent and spontaneous with normal comprehension.  Cognition:     04/22/2024    8:58 AM  MMSE - Mini Mental State Exam  Orientation to time 5  Orientation to Place 5  Registration 3  Attention/ Calculation 3  Recall 3  Language- name 2 objects 2  Language- repeat 1  Language- follow 3 step command 3  Language- read & follow direction 1  Write a sentence 1  Copy design 1  Total score 28      04/22/2024    9:06 AM  Montreal Cognitive Assessment   Visuospatial/ Executive (0/5) 4  Naming (0/3) 3  Attention: Read list of digits (0/2) 2  Attention: Read list of letters (0/1) 1  Attention: Serial 7 subtraction starting at 100 (0/3) 3  Language: Repeat phrase (0/2) 2  Language : Fluency (0/1) 1  Abstraction (0/2) 2  Delayed Recall (0/5) 2  Orientation (0/6) 6  Total 26  Adjusted Score (based  on education) 18       The patient is oriented to person, place, and time;     recent and remote memory intact;     language fluent;     normal attention, concentration,     fund of knowledge Cranial Nerves:    The pupils are equal, round, and reactive to light. Pupils too small to visualize fundi. Visual fields are full to finger confrontation. Extraocular movements are intact. Trigeminal sensation is intact and the muscles of mastication are normal. The face is symmetric. The palate elevates in the midline. Hearing intact. Voice is normal. Shoulder shrug is normal. The tongue has normal motion without fasciculations.   Coordination: nml  Gait:    Heel-toe gait are normal. Imbalance on tandem  Motor Observation:    No asymmetry, no atrophy, and no involuntary movements noted. Tone:    Normal muscle tone.    Posture:    Posture is normal. normal erect    Strength: Right leg weakness vs pain in the  knee    Strength is V/V in the upper and lower limbs.      Sensation: intact to LT. Neg Romberg     Reflex Exam:  DTR's:    Deep tendon reflexes in the upper and lower extremities are symmetrical bilaterally.   Toes:    The toes are downgoing bilaterally.   Clonus:    Clonus is absent.   Assessment/Plan:  78 year old here for dizziness but also wants to discuss memory complaints with husband. In the past evaluated for dizziness and found MRI showed foci of low level restricted diffusion and contrast enhancement in the deep white matter of the left hemisphere probably consistent with late subacute embolic infarctions. However the stroke was not c/w timeframe of start of dizzy spells 6 months prior.  She has had a entire stroke evaluation at that time and we discussed in the past we had extensice discussions with patient and family on her stroke etiology unclear/cryptogenic,  pulmonary embolism, she is on Eliquis . We discussed cryptogenic stroke. We discussed any relation between her DVT and cryptogenic embolic stroke, we discussed pfo as well and she followed with cardiology.  Here for dizziness, however turned out to be a memory appointment and multiple other issues, falls, imbalance, dizziness, difficult to do all of this in one appointment especially if memory is concerned we would have to do an mmse recommended addressing the dizziness first. We will get an MRI of the brain.Today   MMSE was normal 28/30 as was MoCA 26/30, we discussed normal results, she has been c/o memory even from last appointment >3 years ago and if she had a neurodegenerative disorder would expect her to be worse on MMSE/Moca.    Repeat MRI brain w/wo contrast with stroke and MS protocol due to worsening symptoms due to questionable strokes vs MS in the past, abnormal white matter disease, evaluate for other lasions such as schwannoma and reversible causes of memory loss  Will order formal neurocognitive testing due to  persistent complaints, husband and family now concerned. Physical therapy/vestibular therapy for imbalance/falls/dizziness: evaluate and treat imbalance to improve safety/she is a fall risk  Prior: Workup including CTA head and neck, echo, holter monitor unrevealing.  Had loop recorder, will refer to Dr. Lorry: completed, had removed Physical therapy for gait and imbalance, falls: completed in the past Needs vestibular therapy for persistent vertigo: completed in the past for dizziness since 2018 Repeat MRI brain w/wo contrast with stroke  and MS protocol due to worsening symptoms with thin cuts through the IACs due to questionable strokes vs MS int he past, abnormal white matter disease, evaluate for other lasions such as schwannoma  Orders Placed This Encounter  Procedures   MR BRAIN W WO CONTRAST   B12 and Folate Panel   Methylmalonic acid, serum   Vitamin B1   TSH Rfx on Abnormal to Free T4   Basic Metabolic Panel   TSH Rfx on Abnormal to Free T4   Ambulatory referral to Physical Therapy   Ambulatory referral to Neuropsychology     Onetha Epp, MD  [facility] [address] [place], [place] [address]  Phone [phone] Fax [phone]  I spent 30 minutes of face-to-face and non-face-to-face time with patient on the  1. Cognitive dysfunction   2. Imbalance   3. Falls   4. Dizziness     diagnosis.  This included previsit chart review, lab review, study review, order entry, electronic health record documentation, patient education on the different diagnostic and therapeutic options, counseling and coordination of care, risks and benefits of management, compliance, or risk factor reduction

## 2024-04-22 NOTE — Patient Instructions (Addendum)
 MRI of the brain w/wo contrast Physical therapy/vestibular therapy for imbalance Formal memory testing/neurocognitive testing Blood work Follow up with Dr. Onita on blood pressure and other causes

## 2024-04-24 NOTE — Progress Notes (Unsigned)
 Cardiology Office Note:    Date:  04/29/2024   ID:  Sarah DECOSTER, DOB [birth year], MRN [id number]  PCP:  Sarah Rush, Valdez   [facility] Providers Cardiologist:  None Electrophysiologist:  Sarah Rakers, Valdez (Inactive)     Referring Valdez: Sarah Rush, Valdez   No chief complaint on file.   History of Present Illness:    Sarah Valdez is a 78 y.o. female seen at the request of Sarah Sarah for evaluation of arrhythmia. She has a history of PE and is on chronic anticoagulation. Has been followed by our EP service in the past for SVT. She hasn't had any arrhythmia in years. Was on diltiazem  at one point but this was apparently stopped due to swelling. Has history of CVA. ILR placed with no Afib identified and ILR later removed.   She was seeing Neuro surgery and was going to have some type of ablative procedure. Has had injections and this has improved. She did see Neurology last month and BP was quite elevated. She has since been taking at home and readings are 124-126 systolic.   Past Medical History:  Diagnosis Date   Allergy    Bilateral sensorineural hearing loss 11/06/2016   Cancer (HCC)    skin cancer - on back, forehead   Chronic congestive heart failure (HCC) 02/22/2018   Chronic low back pain    Cryptogenic stroke [facility])    followed by Sarah Sarah Valdez   Diverticulosis of colon 2007   Sarah Sarah Valdez   Dyslipidemia    Dysrhythmia    Gallstones 01/20/2014   12/2013 multiple gallstones, largest 1.7 cm in diameter; asymptomatic    History of basal cell carcinoma excision    X3   History of colon polyps    NON-CANCEROUS   History of kidney stones    Hyperlipidemia    Lower extremity edema    Nephrolithiasis    Nocturia    OA (osteoarthritis of spine)    Osteopenia    Palpitations 11/24/2007   Qualifier: Diagnosis of  By: Sarah Valdez     Palpitations    Pancreatic cyst 01/20/2014   01/03/14 8 x 11 mm nonaggressive cystic lesion the pancreas. Followup recommended in one  year.    PONV (postoperative nausea and vomiting)    PSVT (paroxysmal supraventricular tachycardia) (HCC)    adenosine sensitive SVT previously documented   Pulmonary embolism (HCC)    Right ureteral stone    Scoliosis    Urgency of urination    Varicose vein of leg    Venous stasis    Vitamin D  deficiency    Wears glasses     Past Surgical History:  Procedure Laterality Date   APPENDECTOMY  1950   CARPECTOMY WITH RADIAL STYLOIDECTOMY Right 09/12/2010   AND NEURECTOMY  OF WRIST (STAGE III [place] DISEASE)   CLOSED MANIPULATION POST TOTAL  KNEE ARTHROPLASTY  07/13/2006   COLONOSCOPY W/ POLYPECTOMY  2002   adenomatous; Sarah Sarah Valdez   epidural steroid injections     two rounds of injections; lumbar spine    EXTRACORPOREAL SHOCK WAVE LITHOTRIPSY  X3   FACELIFT W/BLEPHAROPLASTY  2005   HYSTEROSCOPY WITH D & C  12/22/2000   implantable loop recorder removal  10/29/2020   MDT Reveal LINQ removed by Sarah Valdez   LOOP RECORDER INSERTION N/A 05/21/2017   Procedure: LOOP RECORDER INSERTION;  Surgeon: Valdez Lynwood, Valdez;  Location: [facility];  Service: Cardiovascular;  Laterality: N/A;   OPEN KNEE  MENISECTOMY  1968   REVERSE SHOULDER ARTHROPLASTY Left 10/01/2023   Procedure: REVERSE SHOULDER ARTHROPLASTY;  Surgeon: Sarah Drivers, Valdez;  Location: [facility];  Service: Orthopedics;  Laterality: Left;    RIGHT URETEROSCOPIC STONE EXTRACTION / STENT PLACEMENT  08/21/2008   right wrist surgery     rotator cuff on right shoulder     TONSILLECTOMY  1950   TOTAL HIP ARTHROPLASTY Left 12/02/2022   Procedure: TOTAL HIP ARTHROPLASTY ANTERIOR APPROACH;  Surgeon: Sarah Cough, Valdez;  Location: [facility];  Service: Orthopedics;  Laterality: Left;   TOTAL KNEE ARTHROPLASTY Right 05/30/2006   TUBAL LIGATION     WISDOM TOOTH EXTRACTION      Current Medications: Current Meds  Medication Sig   acetaminophen  (TYLENOL ) 650 MG CR tablet Take 1,300 mg by mouth every 8 (eight) hours as needed for  pain.   apixaban  (ELIQUIS ) 5 MG TABS tablet Take 5 mg by mouth in the morning and at bedtime.   Brimonidine Tartrate (LUMIFY) 0.025 % SOLN Place 1 drop into both eyes 2 (two) times daily.   cetirizine (ZYRTEC) 10 MG tablet Take 10 mg by mouth daily as needed for allergies.   cyclobenzaprine  (FLEXERIL ) 10 MG tablet Take 1 tablet (10 mg total) by mouth 3 (three) times daily as needed for muscle spasms.   fluticasone  (FLONASE ) 50 MCG/ACT nasal spray Place 1 spray into both nostrils daily as needed for allergies.   meclizine  (ANTIVERT ) 25 MG tablet Take 25 mg by mouth daily as needed.   ondansetron  (ZOFRAN ) 4 MG tablet Take 1 tablet (4 mg total) by mouth every 8 (eight) hours as needed for nausea or vomiting.   pravastatin  (PRAVACHOL ) 40 MG tablet Take 40 mg by mouth daily.     Allergies:   Morphine, Penicillamine, Penicillins, and Levofloxacin   Social History   Socioeconomic History   Marital status: Married    Spouse name: Not on file   Number of children: 2   Years of education: Not on file   Highest education level: Bachelor's degree (e.g., BA, AB, BS)  Occupational History   Not on file  Tobacco Use   Smoking status: Never   Smokeless tobacco: Never  Vaping Use   Vaping status: Never Used  Substance and Sexual Activity   Alcohol  use: Yes    Alcohol /week: 5.0 standard drinks of alcohol     Types: 5 Glasses of wine per week    Comment: 5 or less .a week   Drug use: No   Sexual activity: Not on file  Other Topics Concern   Not on file  Social History Narrative   Lives at home in [place] with spouse.    Retired Optometrist.   Active in the community.   Right handed   Drinks 1 cup of caffeine daily   Social [place] of [employer]   Financial Resource Strain: Not on file  Food Insecurity: No Food Insecurity (12/02/2022)   Hunger Vital Sign    Worried About Running Out of Food in the Last Year: Never true    Ran Out of Food in the Last Year: Never true  Transportation Needs:  No Transportation Needs (12/02/2022)   PRAPARE - Administrator, Civil Service (Medical): No    Lack of Transportation (Non-Medical): No  Physical Activity: Not on file  Stress: Not on file  Social Connections: Not on file     Family History: The patient's family history includes Breast cancer in her sister; Cancer in her  paternal grandfather; Colon polyps in her father; Emphysema in her father; Esophageal cancer in her paternal grandfather; Heart attack (age of onset: 18) in her mother; Heart attack (age of onset: 35) in her maternal grandfather. There is no history of Diabetes, Stroke, Colon cancer, Rectal cancer, or Stomach cancer.  ROS:   Please see the history of present illness.    All other systems reviewed and are negative.  EKGs/Labs/Other Studies Reviewed:    The following studies were reviewed today:  EKG Interpretation Date/Time:  Friday April 29 2024 14:32:41 EDT Ventricular Rate:  97 PR Interval:  140 QRS Duration:  80 QT Interval:  362 QTC Calculation: 459 R Axis:   33  Text Interpretation: Normal sinus rhythm Normal ECG When compared with ECG of 06-Apr-2023 15:05, No significant change was found Confirmed by [place], Morley Gaumer [phone]) on 04/29/2024 2:37:28 PM   Echo 02/07/21: IMPRESSIONS     1. Left ventricular ejection fraction, by estimation, is 55 to 60%. The  left ventricle has normal function. The left ventricle has no regional  wall motion abnormalities. Left ventricular diastolic parameters are  consistent with Grade I diastolic  dysfunction (impaired relaxation). The average left ventricular global  longitudinal strain is -17.5 %. The global longitudinal strain is normal.   2. Right ventricular systolic function is normal. The right ventricular  size is normal. Tricuspid regurgitation signal is inadequate for assessing  PA pressure.   3. The mitral valve is normal in structure. No evidence of mitral valve  regurgitation. No evidence of mitral  stenosis.   4. The aortic valve is tricuspid. Aortic valve regurgitation is not  visualized. No aortic stenosis is present.   5. The inferior vena cava is normal in size with greater than 50%  respiratory variability, suggesting right atrial pressure of 3 mmHg.   EKG Interpretation Date/Time:  Friday April 29 2024 14:32:41 EDT Ventricular Rate:  97 PR Interval:  140 QRS Duration:  80 QT Interval:  362 QTC Calculation: 459 R Axis:   33  Text Interpretation: Normal sinus rhythm Normal ECG When compared with ECG of 06-Apr-2023 15:05, No significant change was found Confirmed by [place], Gustavus Haskin [phone]) on 04/29/2024 2:37:28 PM    Recent Labs: 09/21/2023: Hemoglobin 14.0; Platelets 214 04/22/2024: BUN 25; Creatinine, Ser 0.99; Potassium 4.1; Sodium 141; TSH 3.450  Recent Lipid Panel    Component Value Date/Time   CHOL 184 07/05/2015 1159   TRIG 80.0 07/05/2015 1159   HDL 71.80 07/05/2015 1159   CHOLHDL 3 07/05/2015 1159   VLDL 16.0 07/05/2015 1159   LDLCALC 96 07/05/2015 1159   LDLDIRECT 159.5 11/26/2007 0907     Risk Assessment/Calculations:      HYPERTENSION CONTROL Vitals:   04/29/24 1429 04/29/24 1450  BP: (!) 138/96 (!) 130/90    The patient's blood pressure is elevated above target today.  In order to address the patient's elevated BP: Blood pressure will be monitored at home to determine if medication changes need to be made.            Physical Exam:    VS:  BP (!) 130/90 (BP Location: Left Arm, Patient Position: Sitting, Cuff Size: Normal)   Pulse 96   Ht 5' 7 (1.702 m)   Wt 164 lb 12.8 oz (74.8 kg)   SpO2 97%   BMI 25.81 kg/m     Wt Readings from Last 3 Encounters:  04/29/24 164 lb 12.8 oz (74.8 kg)  04/22/24 140 lb 3.2 oz (63.6 kg)  10/01/23 166 lb (75.3 kg)     GEN:  Well nourished, well developed in no acute distress HEENT: Normal NECK: No JVD; No carotid bruits LYMPHATICS: No lymphadenopathy CARDIAC: RRR, no murmurs, rubs,  gallops RESPIRATORY:  Clear to auscultation without rales, wheezing or rhonchi  ABDOMEN: Soft, non-tender, non-distended MUSCULOSKELETAL:  No edema; No deformity  SKIN: Warm and dry NEUROLOGIC:  Alert and oriented x 3 PSYCHIATRIC:  Normal affect   ASSESSMENT:    1. Supraventricular tachycardia (HCC)    PLAN:    In order of problems listed above:  Remote history of SVT - none in years. Prior ILR negative for any arrhythmia Clearance for Neurosurgery procedure. She is low risk and is cleared. Would hold Eliquis  3 days prior Borderline BP. Recommend lifestyle modification at this point with sodium restriction, heart healthy diet, regular aerobic activity. Monitor BP at home. If it remains high would consider adding ARB       We will follow up PRN    Medication Adjustments/Labs and Tests Ordered: Current medicines are reviewed at length with the patient today.  Concerns regarding medicines are outlined above.  Orders Placed This Encounter  Procedures   EKG 12-Lead   No orders of the defined types were placed in this encounter.   Patient Instructions  Medication Instructions:  Continue same medications  Lab Work: None ordered  Testing/Procedures: None ordered  Follow-Up: At [facility], you and your health needs are our priority.  As part of our continuing mission to provide you with exceptional heart care, our providers are all part of one team.  This team includes your primary Cardiologist (physician) and Advanced Practice Providers or APPs (Physician Assistants and Nurse Practitioners) who all work together to provide you with the care you need, when you need it.  Your next appointment:  As Needed    Provider:  Dr.Binyamin Nelis    We recommend signing up for the patient portal called MyChart.  Sign up information is provided on this After Visit Summary.  MyChart is used to connect with patients for Virtual Visits (Telemedicine).  Patients are able to view  lab/test results, encounter notes, upcoming appointments, etc.  Non-urgent messages can be sent to your provider as well.   To learn more about what you can do with MyChart, go to [email or web address].          Signed, Chelsey Redondo [place], Valdez  04/29/2024 2:53 PM    [facility]

## 2024-04-26 ENCOUNTER — Telehealth: Payer: Self-pay | Admitting: Neurology

## 2024-04-26 ENCOUNTER — Ambulatory Visit: Payer: Self-pay | Admitting: Neurology

## 2024-04-26 NOTE — Telephone Encounter (Signed)
 They have not been able to be reviewed by MD quite yet.

## 2024-04-26 NOTE — Telephone Encounter (Signed)
 Referral for neuropsychology sent through [facility] to [facility]. Phone: [phone]

## 2024-04-26 NOTE — Telephone Encounter (Signed)
 Pt asking for a call to discuss results that are  online

## 2024-04-26 NOTE — Telephone Encounter (Signed)
 Referral for physical therapy sent to through [facility] to [facility]. Phone: [phone]

## 2024-04-26 NOTE — Telephone Encounter (Signed)
 I sent her a message via mychart results which is how I communicate. Unremarkable results so far, still awaiting 2 labs to come back (b1 and mma).

## 2024-04-26 NOTE — Telephone Encounter (Signed)
Pt reviewed message in [place]

## 2024-04-27 ENCOUNTER — Telehealth: Payer: Self-pay | Admitting: Neurology

## 2024-04-27 DIAGNOSIS — Z96651 Presence of right artificial knee joint: Secondary | ICD-10-CM | POA: Diagnosis not present

## 2024-04-27 NOTE — Telephone Encounter (Signed)
 MRI order sent to [facility] to schedule. [phone]

## 2024-04-29 ENCOUNTER — Encounter: Payer: Self-pay | Admitting: Cardiology

## 2024-04-29 ENCOUNTER — Ambulatory Visit: Attending: Cardiology | Admitting: Cardiology

## 2024-04-29 VITALS — BP 130/90 | HR 96 | Ht 67.0 in | Wt 164.8 lb

## 2024-04-29 DIAGNOSIS — I471 Supraventricular tachycardia, unspecified: Secondary | ICD-10-CM | POA: Diagnosis not present

## 2024-04-29 DIAGNOSIS — R030 Elevated blood-pressure reading, without diagnosis of hypertension: Secondary | ICD-10-CM | POA: Diagnosis not present

## 2024-04-29 DIAGNOSIS — Z7901 Long term (current) use of anticoagulants: Secondary | ICD-10-CM | POA: Diagnosis not present

## 2024-04-29 LAB — BASIC METABOLIC PANEL WITH GFR
BUN/Creatinine Ratio: 25 (ref 12–28)
BUN: 25 mg/dL (ref 8–27)
CO2: 20 mmol/L (ref 20–29)
Calcium: 10 mg/dL (ref 8.7–10.3)
Chloride: 104 mmol/L (ref 96–106)
Creatinine, Ser: 0.99 mg/dL (ref 0.57–1.00)
Glucose: 88 mg/dL (ref 70–99)
Potassium: 4.1 mmol/L (ref 3.5–5.2)
Sodium: 141 mmol/L (ref 134–144)
eGFR: 58 mL/min/1.73 — ABNORMAL LOW (ref >59)

## 2024-04-29 LAB — VITAMIN B1: Thiamine: 119.3 nmol/L (ref 66.5–200.0)

## 2024-04-29 LAB — TSH RFX ON ABNORMAL TO FREE T4: TSH: 3.45 u[IU]/mL (ref 0.450–4.500)

## 2024-04-29 LAB — B12 AND FOLATE PANEL
Folate: 8.9 ng/mL (ref >3.0)
Vitamin B-12: 339 pg/mL (ref 232–1245)

## 2024-04-29 LAB — METHYLMALONIC ACID, SERUM: Methylmalonic Acid: 199 nmol/L (ref 0–378)

## 2024-04-29 NOTE — Patient Instructions (Signed)

## 2024-05-04 ENCOUNTER — Encounter: Payer: Self-pay | Admitting: Psychology

## 2024-05-04 ENCOUNTER — Encounter: Payer: Self-pay | Admitting: Neurology

## 2024-05-09 DIAGNOSIS — M47816 Spondylosis without myelopathy or radiculopathy, lumbar region: Secondary | ICD-10-CM | POA: Diagnosis not present

## 2024-05-14 ENCOUNTER — Ambulatory Visit
Admission: RE | Admit: 2024-05-14 | Discharge: 2024-05-14 | Disposition: A | Discharge status: Home / Self Care | Source: Ambulatory Visit | Attending: Neurology | Admitting: Neurology

## 2024-05-14 DIAGNOSIS — R2689 Other abnormalities of gait and mobility: Secondary | ICD-10-CM

## 2024-05-14 DIAGNOSIS — F09 Unspecified mental disorder due to known physiological condition: Secondary | ICD-10-CM | POA: Diagnosis not present

## 2024-05-14 DIAGNOSIS — R296 Repeated falls: Secondary | ICD-10-CM

## 2024-05-14 DIAGNOSIS — R42 Dizziness and giddiness: Secondary | ICD-10-CM | POA: Diagnosis not present

## 2024-05-14 MED ORDER — GADOPICLENOL 0.5 MMOL/ML IV SOLN
7.5000 mL | Freq: Once | INTRAVENOUS | Status: AC | PRN
  Administered 2024-05-14: 7.5 mL via INTRAVENOUS

## 2024-05-16 ENCOUNTER — Encounter: Payer: Self-pay | Admitting: Psychology

## 2024-05-16 ENCOUNTER — Encounter: Attending: Psychology | Admitting: Psychology

## 2024-05-16 DIAGNOSIS — R413 Other amnesia: Secondary | ICD-10-CM | POA: Diagnosis not present

## 2024-05-16 DIAGNOSIS — I639 Cerebral infarction, unspecified: Secondary | ICD-10-CM | POA: Diagnosis not present

## 2024-05-16 NOTE — Progress Notes (Signed)
 Neuropsychological Consultation   Patient:   Sarah Valdez   DOB:   August 22, 1946  MR Number:  [id number]  Location:  [facility] FOR PAIN AND [facility] MEDICINE [facility] PHYSICAL MEDICINE AND REHABILITATION [address] [place], [address] [place] [address] Dept: [phone]           Date of Service:   05/16/2024  Location of Service and Individuals present: Today's visit was  conducted in my outpatient clinic office with the patient myself present.  Start Time:   1 PM End Time:   3 PM  1 hour and 15 minutes was spent in face-to-face formal clinical interview and the other 45 minutes was spent with record review, report writing and setting up testing protocols.  Patient Consent and Confidentiality: Limits of confidentiality were reviewed particularly centering around the fact that the patient had been referred for neuropsychological evaluation and what that would contain and be made available to not only her treating physician/neurologist Onetha Epp, MD but will also be available in the patient's electronic medical records.  Patient consents to proceed with the evaluation.  Consent for Evaluation and Treatment:  Signed:  Yes Explanation of Privacy Policies:  Signed:  Yes Discussion of Confidentiality Limits:  Yes  Provider/Observer:  Norleen Asa, Psy.D.       Clinical Neuropsychologist       Billing Code/Service: 96116/96121  Chief Complaint:     Chief Complaint  Patient presents with   Memory Loss   Dizziness   Fall   Back Pain    Reason for Service:    Sarah Valdez is a 78 year old female referred for neuropsychological evaluation by her neurologist, Dr. Epp, for ongoing and progressing memory difficulties and word-finding changes.  The patient has been followed in neurology for dizziness/vertigo and a history of cryptogenic stroke. PMHx includes vitamin D  deficiency, dyslipidemia, allergic rhinitis, cough variant asthma, diverticulitis,  degenerative disc disease, wrist osteonecrosis, skin cancer, colonic polyps, asthenia, back spasms, hepatic steatosis, gallstones, and bilateral hearing loss. Also has a history of chronic congestive heart failure, right ureteral stone, bilateral pulmonary embolism, essential hypertension, and supraventricular tachycardia. Status post total left hip arthroplasty, shoulder surgery, and knee replacement. Dizziness has been present since at least 2019.  The patient tends to minimize any cognitive changes that she is experiencing but review of previous medical visits with other providers the patient's husband reported significant short-term memory issues with progression over the past six months, including asking the same questions and repeating things. The patient notes memory changes as far back as 2022. There have been several falls in the past four to five months. The patient's mother-in-law had a 10-year decline with Alzheimer's disease, which is a source of concern for the husband. The patient reports no family history of similar conditions. The purpose of this evaluation is to establish a cognitive baseline.  The patient denies any tremors, significant sleep disturbance, hallucinations or delusions.  The patient does acknowledge a significant history of pain most notably related to back pain which has been alleviated with nerve ablation.  The patient has had extensive workup done on her knee and now has a knee replacement as well as hip replacement and shoulder surgery.  Previous screeners, including a Mini-Mental Status Exam (MMSE) and a Montreal Cognitive Assessment (MoCA), were administered by Dr. Epp.  Patient lost primary credits on the MoCA for delayed recall but otherwise performed well.   Onset and Duration of Symptoms:  The patient first noted memory  changes, primarily with recalling names, in 2022. The husband has noted a progression over the last six months.  Progression of Symptoms:  The  patient feels the difficulty with recalling names has become slightly worse, but otherwise, her memory has been stable over the last couple of years. Her husband perceives a progressive change.  Additional Tests and Measures from other records:  Neuroimaging Results:  Brain MRI on 11/06/2017 was unremarkable, showing only mild age-appropriate chronic microvascular ischemic changes. A previous MRI from 2018 found deep white matter involvement in the left hemisphere consistent with late subacute small vessel infarctions and some findings in the left frontal lobe, raising concern for a possible demyelinating condition versus other issues. A new MRI was completed on 05/14/2024; the report is not yet available in the EMR but will be reviewed.   Laboratory Tests:  B12 and folate levels were within normal limits.   Sleep: The patient describes good sleep and denies difficulty going to sleep or staying asleep.  The patient does report that she takes a nap each day although the patient's husband has described another medical office visits that the patient is regularly nodding off or falling asleep during the day.  Medical History:   Past Medical History:  Diagnosis Date   Allergy    Bilateral sensorineural hearing loss 11/06/2016   Cancer (HCC)    skin cancer - on back, forehead   Chronic congestive heart failure (HCC) 02/22/2018   Chronic low back pain    Cryptogenic stroke [facility])    followed by Dr Ines   Diverticulosis of colon 2007   Dr Obie   Dyslipidemia    Dysrhythmia    Gallstones 01/20/2014   12/2013 multiple gallstones, largest 1.7 cm in diameter; asymptomatic    History of basal cell carcinoma excision    X3   History of colon polyps    NON-CANCEROUS   History of kidney stones    Hyperlipidemia    Lower extremity edema    Nephrolithiasis    Nocturia    OA (osteoarthritis of spine)    Osteopenia    Palpitations 11/24/2007   Qualifier: Diagnosis of  By: Tish MD, William      Palpitations    Pancreatic cyst 01/20/2014   01/03/14 8 x 11 mm nonaggressive cystic lesion the pancreas. Followup recommended in one year.    PONV (postoperative nausea and vomiting)    PSVT (paroxysmal supraventricular tachycardia)    adenosine sensitive SVT previously documented   Pulmonary embolism (HCC)    Right ureteral stone    Scoliosis    Urgency of urination    Varicose vein of leg    Venous stasis    Vitamin D  deficiency    Wears glasses          Patient Active Problem List   Diagnosis Date Noted   S/P total left hip arthroplasty 12/02/2022   Supraventricular tachycardia 03/05/2022   Bilateral pulmonary embolism (HCC) 10/11/2020   Essential hypertension 10/11/2020   Right ureteral stone    Chronic congestive heart failure (HCC) 02/22/2018   Cryptogenic stroke (HCC) 12/27/2016   Bilateral sensorineural hearing loss 11/06/2016   Imbalance 11/06/2016   Hepatic steatosis 01/20/2014   Gallstones 01/20/2014   Pancreatic cyst 01/20/2014   Abnormal CT scan, liver 01/02/2014   Back spasm 06/10/2013   Osteopenia 09/20/2012   COUGH VARIANT ASTHMA 02/11/2010   Diverticulosis of large intestine 12/25/2009   Wrist osteonecrosis 12/25/2009   Vitamin D  deficiency 11/28/2008   NEPHROLITHIASIS,  HX OF 11/28/2008   Dyslipidemia 11/24/2007   Allergic rhinitis, cause unspecified 11/24/2007   PALPITATIONS 11/24/2007   DEGENERATIVE DISC DISEASE 01/19/2007   SKIN CANCER, HX OF 01/19/2007   History of colonic polyps 01/19/2007     Behavioral Observation/Mental Status:   Sarah Valdez  is an 78 year old, right-handed female. She was alert and oriented to person, place, and situation. She was cooperative and motivated. Speech was clear and fluent. Mood was euthymic, affect was appropriate. No suicidal or homicidal ideation was reported. Judgment and insight appeared adequate. She reports some difficulty with recalling names of people and appointments but denies issues with episodic  memory or geographic orientation for familiar routes.  Interactions:    Active Appropriate  Attention:   within normal limits and attention span and concentration were age appropriate  Memory:   abnormal; remote memory intact, recent memory impaired  Visuo-spatial:   not examined  Speech (Volume):  normal  Speech:   normal; normal  Thought Process:  Coherent and Relevant  Coherent and Linear  Though Content:  WNL; not suicidal and not homicidal  Orientation:   person, place, and time/date  Judgment:   Good  Planning:   Fair  Affect:    Appropriate  Mood:    Euthymic  Insight:   Good  Intelligence:   high  Marital Status/Living:  Born in [place]. Has one living younger sister who has a history of breast cancer. Lives in [place] with her husband of over 55 years; this is her only marriage. They have two children, one boy and one girl. Reports significant family stress related to her daughter-in-law, which has caused separation within the family.  Educational and Occupational History:     Highest Level of Education:  Engineer, maintenance (IT) from [employer]  Educational and Career Achievements: The patient participated in many extracurricular activities.  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  Hobbies and Interests:  Past hobbies included arts, pottery, decorating, and gardening. Quit pottery about a year ago but may return to it.   Psychiatric History:  No prior psychiatric history noted  Substance Use or Abuse:  No concerns regarding substance use.  Family Med/Psych History:  Family History  Problem Relation Age of Onset   Heart attack Mother 1   Emphysema Father        smoker   Colon polyps Father    Breast cancer Sister        late 17s   Cancer Paternal Grandfather        throat cancer   Esophageal cancer Paternal Grandfather    Heart attack  Maternal Grandfather 34   Diabetes Neg Hx    Stroke Neg Hx    Colon cancer Neg Hx    Rectal cancer Neg Hx    Stomach cancer Neg Hx     Impression/DX:   Sarah Valdez is a 78 year old female referred by Dr. Ines for neuropsychological evaluation due to progressing memory and word-finding difficulties. Primary complaints involve difficulty recalling names, first noted in 2022. While she feels her memory is relatively stable, her husband reports progressive changes over the last six months and is concerned due to his mother's history of Alzheimer's disease. Functional difficulties include needing to check her calendar frequently. She denies issues with geographic orientation for familiar routes or episodic memory recall.  Disposition/Plan:  A formal neuropsychological evaluation  is scheduled to establish a cognitive baseline. The evaluation will be a four-hour block of time with a psychometrician. The assessment will include various standardized tests to measure different components of memory and other cognitive abilities. Results will be detailed in a formal report, which will be available in the EMR and provided to the referring physician, Dr. Ines, and her neurology team. A follow-up feedback session will be scheduled after the report is completed.  Diagnosis:    Memory loss  Cryptogenic stroke [facility])        Note: This document was prepared using Dragon voice recognition software and may include unintentional dictation errors.   Electronically Signed   _______________________ Norleen Asa, Psy.D. Clinical Neuropsychologist

## 2024-05-17 DIAGNOSIS — M4316 Spondylolisthesis, lumbar region: Secondary | ICD-10-CM | POA: Diagnosis not present

## 2024-05-17 NOTE — Progress Notes (Signed)
 Please call and inform patient that the recent MRI Brain showed chronic small vessel disease. Inform patient that it has progressed from her previous MRI in 2019

## 2024-05-18 NOTE — Telephone Encounter (Signed)
-----   Message from [facility] sent at 05/17/2024 10:15 AM EDT ----- Please call and inform patient that the recent MRI Brain showed chronic small vessel disease. Inform patient that it has progressed from her previous MRI in 2019 ----- Message ----- From: Hilliard Heather CROME, RN Sent: 05/17/2024  10:01 AM EDT To: Pastor Falling, MD  Will you result for Dr Ines this AM? Thanks

## 2024-05-18 NOTE — Telephone Encounter (Signed)
 I called the patient and LVM (ok per DPR) informing pt of her MRI brain results as noted below.Also reminded pt she has upcoming visits with Dr Corina for formal memory testing and will see Dr Ines for f/u in January. Left office number in message if she has any questions.

## 2024-05-19 DIAGNOSIS — M4316 Spondylolisthesis, lumbar region: Secondary | ICD-10-CM | POA: Diagnosis not present

## 2024-05-24 DIAGNOSIS — M4316 Spondylolisthesis, lumbar region: Secondary | ICD-10-CM | POA: Diagnosis not present

## 2024-05-25 ENCOUNTER — Ambulatory Visit

## 2024-05-26 DIAGNOSIS — M4316 Spondylolisthesis, lumbar region: Secondary | ICD-10-CM | POA: Diagnosis not present

## 2024-05-30 ENCOUNTER — Encounter: Attending: Psychology

## 2024-05-30 ENCOUNTER — Telehealth: Payer: Self-pay | Admitting: Neurology

## 2024-05-30 DIAGNOSIS — I639 Cerebral infarction, unspecified: Secondary | ICD-10-CM | POA: Diagnosis not present

## 2024-05-30 DIAGNOSIS — R413 Other amnesia: Secondary | ICD-10-CM | POA: Insufficient documentation

## 2024-05-30 NOTE — Progress Notes (Addendum)
 Behavioral Observations:  The patient appeared well-groomed and appropriately dressed. Her manners were polite and appropriate to the situation. The patient's attitude towards testing was positive and she demonstrated a good effort.  Neuropsychology Note  Sarah Valdez completed 180 minutes of neuropsychological testing with technician, Josue Ned, BA, under the supervision of Norleen Asa, PsyD., Clinical Neuropsychologist. The patient did not appear overtly distressed by the testing session, per behavioral observation or via self-report to the technician. Rest breaks were offered.   Clinical Decision Making: In considering the patient's current level of functioning, level of presumed impairment, nature of symptoms, emotional and behavioral responses during clinical interview, level of literacy, and observed level of motivation/effort, a battery of tests was selected by Dr. Asa during initial consultation on 05/16/2024. This was communicated to the technician. Communication between the neuropsychologist and technician was ongoing throughout the testing session and changes were made as deemed necessary based on patient performance on testing, technician observations and additional pertinent factors such as those listed above.  Tests Administered: Automatic Data Edition (BNT-2) Controlled Oral Word Association Test (COWAT; FAS & Animals)  Finger Tapping Test (FTT) Grooved Pegboard Wechsler Adult Intelligence Scale, 4th Edition (WAIS-IV) Wechsler Memory Scale, 4th Edition (WMS-IV); Older Adult Battery  [place]  Card Sorting Test [id number])   Results:  BNT-2:   Raw   Norm Score Percentile Range  [place] Naming Test (BNT-2)  51 t = 45 30 %ile Average    COWAT: FAS total= 46 Z= 0.90 Animals total= 20 Z= 1.0  FTT: R (DH) Average= 38.6  Percentile Rank= 47th L (NDH) Average= 35.2 Percentile Rank= 48th  Grooved Pegboard: R (DH) time= 103s Drops=  0 Percentile Rank= 33rd L (NDH) time= 126s Drops= 0  Percentile Rank= 35th  WAIS-IV:   Composite Score Summary  Scale Sum of Scaled Scores Composite Score Percentile Rank 95% Conf. Interval Qualitative Description  Verbal Comprehension 37 VCI 112 79 106-117 High Average  Perceptual Reasoning 34 PRI 107 68 100-113 Average  Working Memory 24 WMI 111 77 104-117 High Average  Processing Speed 17 PSI 92 30 84-101 Average  Full Scale 112 FSIQ 108 70 104-112 Average  General Ability 71 GAI 111 77 106-116 High Average   Verbal Comprehension Subtests Summary  Subtest Raw Score Scaled Score Percentile Rank Reference Group Scaled Score SEM  Similarities 31 15 95 14 1.12  Vocabulary 42 12 75 12 0.73  Information 14 10 50 11 0.73  The scaled scores in the Reference Group Scaled Score column are based on the performance of examinees aged 20:0-34:11 (i.e., the reference group). See Chapter 6 of the WAIS-IV Technical and Interpretive Manual for more information.  Perceptual Reasoning Subtests Summary  Subtest Raw Score Scaled Score Percentile Rank Reference Group Scaled Score SEM  Block Design 33 11 63 7 1.27  Matrix Reasoning 13 11 63 7 0.73  Visual Puzzles 12 12 75 8 0.99   Working Librarian, academic Raw Score Scaled Score Percentile Rank Reference Group Scaled Score SEM  Digit Span 27 12 75 9 0.79  Arithmetic 15 12 75 11 0.95   Processing Speed Subtests Summary  Subtest Raw Score Scaled Score Percentile Rank Reference Group Scaled Score SEM  Symbol Search 13 7 16 3  1.12  Coding 45 10 50 5 1.12    WMS-IV:   Index Score Summary  Index Sum of Scaled Scores Index Score Percentile Rank 95% Confidence Interval Qualitative Descriptor  Auditory Memory (AMI) 48 112 79 105-118  High Average  Visual Memory (VMI) 22 105 63 100-110 Average  Immediate Memory (IMI) 38 117 87 110-122 High Average  Delayed Memory (DMI) 32 104 61 96-111 Average    Primary Subtest Scaled Score  Summary  Subtest Domain Raw Score Scaled Score Percentile Rank  Logical Memory I AM 39 14 91  Logical Memory II AM 20 11 63  Verbal Paired Associates I AM 21 11 63  Verbal Paired Associates II AM 7 12 75  Visual Reproduction I VM 34 13 84  Visual Reproduction II VM 13 9 37  Symbol Span VWM 20 13 84    Auditory Memory Process Score Summary  Process Score Raw Score Scaled Score Percentile Rank Cumulative Percentage (Base Rate)  LM II Recognition 18 - - 26-50%  VPA II Recognition 29 - - >75%   Visual Memory Process Score Summary  Process Score Raw Score Scaled Score Percentile Rank Cumulative Percentage (Base Rate)  VR II Recognition 6 - - >75%   ABILITY-MEMORY ANALYSIS  Ability Score:  VCI: 112 Date of Testing:  WAIS-IV; WMS-IV 2024/05/30  Predicted Difference Method   Index Predicted WMS-IV Index Score Actual WMS-IV Index Score Difference Critical Value  Significant Difference Y/N Base Rate  Auditory Memory 106 112 -6 9.39 N   Visual Memory 105 105 0 8.28 N   Immediate Memory 107 117 -10 10.49 N   Delayed Memory 106 104 2 12.08 N   Statistical significance (critical value) at the .01 level.    WCST-128 (Discontinued)  NOTE: The raw scores printed below are based on an incomplete WCST administration.  WCST scores Raw scores  Trials Administered Total Correct 112 40     Total Errors % Errors 72 64%     Perseverative Responses % Perseverative Responses 80 71%     Perseverative Errors % Perseverative Errors 61 54%     Nonperseverative Errors % Nonperseverative Errors 11 10%     Conceptual Level Responses % Conceptual Level Responses 26 23%     Categories Completed Trials to Complete 1st Category Failure to Maintain Set Learning to Learn 1 11 0 N/A      Feedback to Patient: Sarah Valdez will return on 09/12/2024 for an interactive feedback session with Dr. Corina at which time her test performances, clinical impressions and  treatment recommendations will be reviewed in detail. The patient understands she can contact our office should she require our assistance before this time.  180 minutes spent face-to-face with patient administering standardized tests, 30 minutes spent scoring Radiographer, therapeutic). [CPT [id number], [id number]]  Full report to follow.

## 2024-05-30 NOTE — Telephone Encounter (Signed)
 Called patient to reschedule 09/06/24 appt due to MD departure, pt stated she is meeting with her PCP this week and she will call us  back if she would like to reschedule with us   IF PATIENT CALLS BACK HER NEW NEUROLOGIST IS DR. BUCK

## 2024-05-31 DIAGNOSIS — M4316 Spondylolisthesis, lumbar region: Secondary | ICD-10-CM | POA: Diagnosis not present

## 2024-06-01 DIAGNOSIS — E785 Hyperlipidemia, unspecified: Secondary | ICD-10-CM | POA: Diagnosis not present

## 2024-06-01 DIAGNOSIS — I878 Other specified disorders of veins: Secondary | ICD-10-CM | POA: Diagnosis not present

## 2024-06-01 DIAGNOSIS — R600 Localized edema: Secondary | ICD-10-CM | POA: Diagnosis not present

## 2024-06-01 DIAGNOSIS — Z23 Encounter for immunization: Secondary | ICD-10-CM | POA: Diagnosis not present

## 2024-06-01 DIAGNOSIS — D6859 Other primary thrombophilia: Secondary | ICD-10-CM | POA: Diagnosis not present

## 2024-06-01 DIAGNOSIS — Z7901 Long term (current) use of anticoagulants: Secondary | ICD-10-CM | POA: Diagnosis not present

## 2024-06-01 DIAGNOSIS — Z86718 Personal history of other venous thrombosis and embolism: Secondary | ICD-10-CM | POA: Diagnosis not present

## 2024-06-01 DIAGNOSIS — R002 Palpitations: Secondary | ICD-10-CM | POA: Diagnosis not present

## 2024-06-02 DIAGNOSIS — M4316 Spondylolisthesis, lumbar region: Secondary | ICD-10-CM | POA: Diagnosis not present

## 2024-06-09 DIAGNOSIS — M4316 Spondylolisthesis, lumbar region: Secondary | ICD-10-CM | POA: Diagnosis not present

## 2024-06-14 DIAGNOSIS — M4316 Spondylolisthesis, lumbar region: Secondary | ICD-10-CM | POA: Diagnosis not present

## 2024-06-16 DIAGNOSIS — M4316 Spondylolisthesis, lumbar region: Secondary | ICD-10-CM | POA: Diagnosis not present

## 2024-06-21 DIAGNOSIS — L578 Other skin changes due to chronic exposure to nonionizing radiation: Secondary | ICD-10-CM | POA: Diagnosis not present

## 2024-06-21 DIAGNOSIS — D1801 Hemangioma of skin and subcutaneous tissue: Secondary | ICD-10-CM | POA: Diagnosis not present

## 2024-06-21 DIAGNOSIS — L820 Inflamed seborrheic keratosis: Secondary | ICD-10-CM | POA: Diagnosis not present

## 2024-06-21 DIAGNOSIS — L738 Other specified follicular disorders: Secondary | ICD-10-CM | POA: Diagnosis not present

## 2024-06-21 DIAGNOSIS — L821 Other seborrheic keratosis: Secondary | ICD-10-CM | POA: Diagnosis not present

## 2024-06-21 DIAGNOSIS — L570 Actinic keratosis: Secondary | ICD-10-CM | POA: Diagnosis not present

## 2024-06-21 DIAGNOSIS — M4316 Spondylolisthesis, lumbar region: Secondary | ICD-10-CM | POA: Diagnosis not present

## 2024-06-21 DIAGNOSIS — L814 Other melanin hyperpigmentation: Secondary | ICD-10-CM | POA: Diagnosis not present

## 2024-06-23 DIAGNOSIS — M4316 Spondylolisthesis, lumbar region: Secondary | ICD-10-CM | POA: Diagnosis not present

## 2024-06-28 DIAGNOSIS — M4316 Spondylolisthesis, lumbar region: Secondary | ICD-10-CM | POA: Diagnosis not present

## 2024-06-30 DIAGNOSIS — M4316 Spondylolisthesis, lumbar region: Secondary | ICD-10-CM | POA: Diagnosis not present

## 2024-07-04 DIAGNOSIS — M4316 Spondylolisthesis, lumbar region: Secondary | ICD-10-CM | POA: Diagnosis not present

## 2024-07-13 ENCOUNTER — Encounter: Attending: Psychology | Admitting: Psychology

## 2024-07-13 ENCOUNTER — Encounter: Payer: Self-pay | Admitting: Psychology

## 2024-07-13 DIAGNOSIS — G3184 Mild cognitive impairment, so stated: Secondary | ICD-10-CM | POA: Diagnosis present

## 2024-07-13 DIAGNOSIS — R413 Other amnesia: Secondary | ICD-10-CM | POA: Insufficient documentation

## 2024-07-13 NOTE — Progress Notes (Signed)
 Neuropsychological Evaluation   Patient:  Sarah Valdez   DOB: [birth year]  MR Number: [id number]  Location: [facility] [place] [facility] [address] [place], [address] [place] [address] Dept: [phone]  Start: 11 AM End: 12 PM  Provider/Observer:     Norleen JONELLE Asa PsyD  Chief Complaint:      Chief Complaint  Patient presents with   Memory Loss   Dizziness   Fall   Back Pain    Reason For Service:     Sarah Valdez is a 78 year old female referred for neuropsychological evaluation by her neurologist, Dr. Ines, for ongoing and progressing memory difficulties and word-finding changes.  The patient has been followed in neurology for dizziness/vertigo and a history of cryptogenic stroke. PMHx includes vitamin D  deficiency, dyslipidemia, allergic rhinitis, cough variant asthma, diverticulitis, degenerative disc disease, wrist osteonecrosis, skin cancer, colonic polyps, asthenia, back spasms, hepatic steatosis, gallstones, and bilateral hearing loss. Also has a history of chronic congestive heart failure, right ureteral stone, bilateral pulmonary embolism, essential hypertension, and supraventricular tachycardia. Status post total left hip arthroplasty, shoulder surgery, and knee replacement. Dizziness has been present since at least 2019.  The patient tends to minimize any cognitive changes that she is experiencing but review of previous medical visits with other providers the patient's husband reported significant short-term memory issues with progression over the past six months, including asking the same questions and repeating things. The patient notes memory changes as far back as 2022. There have been several falls in the past four to five months. The patient's mother-in-law had a 10-year decline with assumed Alzheimer's disease, which is a source of concern for the husband. The patient reports no other  family history of similar conditions. The purpose of this evaluation is to establish a cognitive baseline.   The patient denies any tremors, significant sleep disturbance, hallucinations or delusions.  The patient does acknowledge a significant history of pain most notably related to back pain which has been alleviated with nerve ablation.  The patient has had extensive workup done on her knee and now has a knee replacement as well as hip replacement and shoulder surgery.   Previous screeners, including a Mini-Mental Status Exam (MMSE) and a Montreal Cognitive Assessment (MoCA), were administered by Dr. Ines.  Patient lost primary credits on the MoCA for delayed recall but otherwise performed well.    Onset and Duration of Symptoms:  The patient first noted memory changes, primarily with recalling names, in 2022. The husband has noted a progression over the last six months.   Progression of Symptoms:  The patient feels the difficulty with recalling names has become slightly worse, but otherwise, her memory has been stable over the last couple of years. Her husband perceives a progressive change.   Additional Tests and Measures from other records:   Neuroimaging Results:  Brain MRI on 11/06/2017 was unremarkable, showing only mild age-appropriate chronic microvascular ischemic changes. A previous MRI from 2018 found deep white matter involvement in the left hemisphere consistent with late subacute small vessel infarctions and some findings in the left frontal lobe, raising concern for a possible demyelinating condition versus other issues. A more recent MRI was completed on 05/14/2024; the impressions provided by Dr. Margaret noted:  - Multiple round and ovoid periventricular, pericallosal, subcortical foci of nonspecific T2 hyperintensities.  Some of these are perpendicular to the corpus callosum.  No abnormal enhancement.  Considerations include autoimmune, inflammatory,  demyelinating,  post-infectious, or microvascular ischemic etiologies.  - No acute findings.  Compared to prior MRI from 11/06/2017, there has been progression of these white matter changes.  Laboratory Tests:  B12 and folate levels were within normal limits.    Sleep: The patient describes good sleep and denies difficulty going to sleep or staying asleep.  The patient does report that she takes a nap each day although the patient's husband has described another medical office visits that the patient is regularly nodding off or falling asleep during the day.   Medical History:                         Past Medical History:  Diagnosis Date   Allergy     Bilateral sensorineural hearing loss 11/06/2016   Cancer (HCC)      skin cancer - on back, forehead   Chronic congestive heart failure (HCC) 02/22/2018   Chronic low back pain     Cryptogenic stroke [facility])      followed by Dr Ines   Diverticulosis of colon 2007    Dr Obie   Dyslipidemia     Dysrhythmia     Gallstones 01/20/2014    12/2013 multiple gallstones, largest 1.7 cm in diameter; asymptomatic    History of basal cell carcinoma excision      X3   History of colon polyps      NON-CANCEROUS   History of kidney stones     Hyperlipidemia     Lower extremity edema     Nephrolithiasis     Nocturia     OA (osteoarthritis of spine)     Osteopenia     Palpitations 11/24/2007    Qualifier: Diagnosis of  By: Tish MD, William     Palpitations     Pancreatic cyst 01/20/2014    01/03/14 8 x 11 mm nonaggressive cystic lesion the pancreas. Followup recommended in one year.    PONV (postoperative nausea and vomiting)     PSVT (paroxysmal supraventricular tachycardia)      adenosine sensitive SVT previously documented   Pulmonary embolism (HCC)     Right ureteral stone     Scoliosis     Urgency of urination     Varicose vein of leg     Venous stasis     Vitamin D  deficiency     Wears glasses                                                                  Patient Active Problem List    Diagnosis Date Noted   S/P total left hip arthroplasty 12/02/2022   Supraventricular tachycardia 03/05/2022   Bilateral pulmonary embolism (HCC) 10/11/2020   Essential hypertension 10/11/2020   Right ureteral stone     Chronic congestive heart failure (HCC) 02/22/2018   Cryptogenic stroke (HCC) 12/27/2016   Bilateral sensorineural hearing loss 11/06/2016   Imbalance 11/06/2016   Hepatic steatosis 01/20/2014   Gallstones 01/20/2014   Pancreatic cyst 01/20/2014   Abnormal CT scan, liver 01/02/2014   Back spasm 06/10/2013   Osteopenia 09/20/2012   COUGH VARIANT ASTHMA 02/11/2010   Diverticulosis of large intestine 12/25/2009   Wrist osteonecrosis 12/25/2009   Vitamin D  deficiency 11/28/2008  NEPHROLITHIASIS, HX OF 11/28/2008   Dyslipidemia 11/24/2007   Allergic rhinitis, cause unspecified 11/24/2007   PALPITATIONS 11/24/2007   DEGENERATIVE DISC DISEASE 01/19/2007   SKIN CANCER, HX OF 01/19/2007   History of colonic polyps 01/19/2007    Tests Administered: Automatic Data Edition (BNT-2) Controlled Oral Word Association Test (COWAT; FAS & Animals)  Finger Tapping Test (FTT) Grooved Pegboard Wechsler Adult Intelligence Scale, 4th Edition (WAIS-IV) Wechsler Memory Scale, 4th Edition (WMS-IV); Older Adult Battery  [place]  Card Sorting Test [phone])   Participation Level:   Active  Participation Quality:  Appropriate      Behavioral Observation:  The patient appeared well-groomed and appropriately dressed. Her manners were polite and appropriate to the situation. The patient's attitude towards testing was positive and she demonstrated a good effort.   Well Groomed, Alert, and Appropriate.   Test Results:     BNT-2:               Raw     Norm Score Percentile Range  [place] Naming Test (BNT-2)   51 t = 45 30 %ile Average      COWAT: FAS total= 46 Z= 0.90 Animals total= 20 Z= 1.0  FTT: R (DH) Average=  38.6  Percentile Rank= 47th L (NDH) Average= 35.2 Percentile Rank= 48th  Grooved Pegboard: R (DH) time= 103s Drops= 0 Percentile Rank= 33rd L (NDH) time= 126s Drops= 0  Percentile Rank= 35th   WAIS-IV:              Composite Score Summary          Scale Sum of Scaled Scores Composite Score Percentile Rank 95% Conf. Interval Qualitative Description  Verbal Comprehension 37 VCI 112 79 106-117 High Average  Perceptual Reasoning 34 PRI 107 68 100-113 Average  Working Memory 24 WMI 111 77 104-117 High Average  Processing Speed 17 PSI 92 30 84-101 Average  Full Scale 112 FSIQ 108 70 104-112 Average  General Ability 71 GAI 111 77 106-116 High Average            Verbal Comprehension Subtests Summary        Subtest Raw Score Scaled Score Percentile Rank Reference Group Scaled Score SEM  Similarities 31 15 95 14 1.12  Vocabulary 42 12 75 12 0.73  Information 14 10 50 11 0.73            Perceptual Reasoning Subtests Summary        Subtest Raw Score Scaled Score Percentile Rank Reference Group Scaled Score SEM  Block Design 33 11 63 7 1.27  Matrix Reasoning 13 11 63 7 0.73  Visual Puzzles 12 12 75 8 0.99          Working Comptroller Raw Score Scaled Score Percentile Rank Reference Group Scaled Score SEM  Digit Span 27 12 75 9 0.79  Arithmetic 15 12 75 11 0.95          Processing Speed Subtests Summary        Subtest Raw Score Scaled Score Percentile Rank Reference Group Scaled Score SEM  Symbol Search 13 7 16 3  1.12  Coding 45 10 50 5 1.12    WMS-IV:            Index Score Summary        Index Sum of Scaled Scores Index Score Percentile Rank 95% Confidence Interval Qualitative Descriptor  Auditory Memory (AMI) 48 112 79 105-118 High Average  Visual Memory (VMI) 22 105 63 100-110 Average  Immediate Memory (IMI) 38 117 87 110-122 High Average  Delayed Memory (DMI) 32 104 61 96-111 Average           Primary Subtest Scaled Score Summary        Subtest Domain Raw Score Scaled Score Percentile Rank  Logical Memory I AM 39 14 91  Logical Memory II AM 20 11 63  Verbal [employer] I AM 21 11 63  Verbal [employer] II AM 7 12 75  Visual Reproduction I VM 34 13 84  Visual Reproduction II VM 13 9 37  Symbol Span VWM 20 13 84            Auditory Memory Process Score Summary      Process Score Raw Score Scaled Score Percentile Rank Cumulative Percentage (Base Rate)  LM II Recognition 18 - - 26-50%  VPA II Recognition 29 - - >75%         Visual Memory Process Score Summary      Process Score Raw Score Scaled Score Percentile Rank Cumulative Percentage (Base Rate)  VR II Recognition 6 - - >75%    ABILITY-MEMORY ANALYSIS   Ability Score:    VCI: 112 Date of Testing:           WAIS-IV; WMS-IV 2024/05/30             Predicted Difference Method    Index Predicted WMS-IV Index Score Actual WMS-IV Index Score Difference Critical Value   Significant Difference Y/N Base Rate  Auditory Memory 106 112 -6 9.39 N    Visual Memory 105 105 0 8.28 N    Immediate Memory 107 117 -10 10.49 N    Delayed Memory 106 104 2 12.08 N                WCST-128 (Discontinued)   NOTE: The raw scores printed below are based on an incomplete WCST administration.    WCST scores Raw scores  Trials Administered Total Correct 112 40       Total Errors % Errors 72 64%       Perseverative Responses % Perseverative Responses 80 71%       Perseverative Errors % Perseverative Errors 61 54%       Nonperseverative Errors % Nonperseverative Errors 11 10%       Conceptual Level Responses % Conceptual Level Responses 26 23%       Categories Completed Trials to Complete 1st Category Failure to Maintain Set Learning to Learn 1 11 0 N/A    Summary of Results:    Initially, an estimation as to the validity of the current neuropsychological test data was performed.  Behavioral observations suggest that the patient  maintain good effort throughout and tried her hardest to maintain a positive attitude towards the testing procedures.  Embedded validity checks also strongly suggest valid assessment and overall this does appear to be a fair and valid assessment of current obtained neuropsychological assessment is interpretable.  Secondly, an estimation was made as to the patient's premorbid intellectual and cognitive functioning to provide a comparison point for current objective neuropsychological test data.  The patient graduated from the [place] [employer] [place]  at [employer] and was an active participant in extracurricular activities throughout her educational experience.  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  Patient has had  active hobbies throughout her life.  We will utilize a conservative estimation of high average global cognitive capacities premorbidly and the patient is likely had some individual cognitive domains in the high average to superior range.   Intellectual Functioning (WAIS-IV) Full Scale IQ (FSIQ): 108 (70th percentile) - Average General Ability Index (GAI): 111 (77th percentile) - High Average Index Scores:  Verbal Comprehension (VCI): 112 - High Average Perceptual Reasoning (PRI): 107 - Average Working Memory (WMI): 111 - High Average Processing Speed (PSI): 92 - Average Interpretation: Intellectual functioning is broadly in the average to high average range, with relative strengths in verbal comprehension and working memory. Processing speed is a relative weakness.  This pattern of global/composite cognitive functioning shows that she is generally maintaining her overall cognitive capacity with relative weaknesses particularly for focus execute/information processing speed and some potentially mild but not particularly significant changes and  weaknesses within visual-spatial and visual processing types of skills.  This pattern is consistent with those found neuroimaging.    Language [place] Test (BNT-2): Raw = 51; Norm Score = 45; 30th percentile - Average Interpretation: Confrontation naming skills are within normal limits with no indication of deficits in this area.  Verbal Fluency COWAT (FAS): 46 (Z = 0.90) - High Average Animals: 20 (Z = 1.0) - High Average Interpretation: Verbal fluency is a strength, which is likely consistent with lifelong cognitive strengths for both lexical and semantic fluency.  Memory (WMS-IV) Auditory Memory: 112 (79th percentile) - High Average Visual Memory: 105 (63rd percentile) - Average Immediate Memory: 117 (87th percentile) - High Average Delayed Memory: 104 (61st percentile) - Average Interpretation: Immediate recall and auditory memory are strong; visual and delayed memory are average.  Overall, the patient does show greater auditory memory capacity versus visual memory capacity but there are no clear indications of any significant loss.  Generally speaking, the patient is able to maintain adequately information after his initially stored and organized and the patient displayed significant improvements on her average delayed memory recall under recognition/cued recall for months.  Motor Speed & Dexterity Finger Tapping Test: Average bilaterally (47th-48th percentile) Grooved Pegboard: Low Average dexterity (33rd-35th percentile) Interpretation: Motor speed is average; fine motor dexterity is mildly reduced.  However, there was general consistency for both fine motor control and fine motor speed between her dominant versus nondominant hand and did not indicate any lateralization of findings.  Executive Function WCST: High error rate (64%), perseverative errors (54%), only one category completed Interpretation: Suggests difficulty with cognitive flexibility and set-shifting.  The 1 area  overall that showed any type of cognitive weakness or difficulty had to do with this 1 individual executive functioning measure where the patient had difficulty with shifting set and cognitive flexibility overall.  This is a demanding test that requires multiple brain areas to actively and sequentially function together and I suspect reduced relative information processing speed played a primary role consistent with observed changes in white matter tracts.  Overall Summary The the patient demonstrates average to high average intellectual abilities, with notable strengths in verbal reasoning, working memory, and verbal fluency. Memory performance is generally intact, particularly for auditory and immediate recall. Relative weaknesses include processing speed, fine motor dexterity, and executive functioning, particularly cognitive flexibility.  The pattern of cognitive strengths and weaknesses would be most consistent with mild changes typically seen with changes in cerebral white matter tracts.  Impression/Diagnosis:   Overall, the results of the current neuropsychological evaluation are generally encouraging regarding the pattern of cognitive  strengths and weaknesses, subjective reports and available medical information to show inconsistencies with progressive degenerative major neurocognitive conditions.  The pattern of cognitive strengths and weaknesses is not consistent with those typically see with conditions such as Alzheimer's, Lewy body etc.  The patient is generally maintaining her overall global cognitive capacity and the only clear mild weaknesses noted relative to premorbid estimates had to do with aspects of information processing speed, fine motor dexterity bilaterally and more complex integrated cognitive functioning's needed for time-based executive/reasoning and problem-solving types of challenges.  We have a very strong baseline of clinical information that would allow for repeat testing in the  future if need be.  The overall pattern of cognitive strengths and weaknesses especially when put into context with available medical information and subjective descriptions by the patient and husband previously would be consistent with white matter involvement as the primary etiological factor.  While causative factor of the observed white matter changes noted in repeat MRI studies are still being investigated with potential microvascular/small vessel disease as a causative factor versus some type of potential autoimmune or inflammatory/demyelinating condition will need to be ongoing considerations for neurology for specific causative factors.  In any event, the patient again is not showing patterns consistent with progressive degenerative major neurocognitive disorder such as Alzheimer's etc. and observed data on the MRIs would be consistent with observed data on neurocognitive testing.  The patient does have a time to return for feedback with myself regarding the neuropsychological evaluation and it is currently scheduled on 09/12/2024 but we will work on trying to move that appointment up if possible.  In any event, the patient should focus her efforts on maintaining these foundational types of aspects including diet, active physical movement and maintaining good sleep patterns wherever possible.  As the patient's referring neurologist is no longer being followed by Dr. Darleen as Dr. Ines is left the practice and the plan is that the patient be transferred over to Dr. Buck.  That coordination is still being worked out.  Diagnosis:    Mild cognitive impairment of uncertain or unknown etiology  Memory loss   _____________________ Norleen Asa, Psy.D. Clinical Neuropsychologist

## 2024-07-26 ENCOUNTER — Encounter: Payer: Self-pay | Admitting: Neurology

## 2024-07-26 ENCOUNTER — Ambulatory Visit: Admitting: Neurology

## 2024-07-26 VITALS — BP 145/84 | HR 77 | Ht 67.0 in | Wt 174.4 lb

## 2024-07-26 DIAGNOSIS — Z8673 Personal history of transient ischemic attack (TIA), and cerebral infarction without residual deficits: Secondary | ICD-10-CM

## 2024-07-26 DIAGNOSIS — Z86711 Personal history of pulmonary embolism: Secondary | ICD-10-CM

## 2024-07-26 DIAGNOSIS — R351 Nocturia: Secondary | ICD-10-CM

## 2024-07-26 DIAGNOSIS — Z9189 Other specified personal risk factors, not elsewhere classified: Secondary | ICD-10-CM | POA: Diagnosis not present

## 2024-07-26 DIAGNOSIS — R0683 Snoring: Secondary | ICD-10-CM

## 2024-07-26 DIAGNOSIS — F09 Unspecified mental disorder due to known physiological condition: Secondary | ICD-10-CM

## 2024-07-26 NOTE — Patient Instructions (Addendum)
 Unfortunately, dizziness is a very common complaint but is often not due to a primary neurological reason or single underlying medical problem. Often, there a combination of factors, that result in dizziness. This includes blood pressure fluctuations, medication side effects, blood sugar fluctuations, stress, vertigo, poor sleep with sleep deprivation, dehydration, and electrolyte disturbance or other metabolic and endocrinological reasons, meaning hormone related problems such as thyroid  dysfunction. Keep in mind, that meclizine  and gabapentin can cause dizziness. Even Flexeril  can cause dizziness.  We will do a sleep study to look for sleep apnea and consider treatment with an autoPAP machine.  Please increase your water  intake.

## 2024-07-26 NOTE — Progress Notes (Signed)
 Subjective:    Patient ID: Sarah Valdez is a 78 y.o. female.  HPI    True Mar, MD, PhD [facility] [address], [address] [address] [place], [place] [address]  Ms. Sarah Valdez is an 77 year old female with an underlying complex medical history of stroke, hyperlipidemia, palpitations, PSVT, congestive heart failure, hearing loss, vitamin D  deficiency, pancreatic cyst, allergy, low back pain, diverticulosis, kidney stones, lower extremity edema, history of PE, and mildly overweight state, who presents for follow-up consultation of her cognitive complaints and memory loss.  She has previously seen Dr. Ines in this office and is transitioning care.  I reviewed her office visit note from prior visits with Dr. Ines and copied notes below for reference.  She was last seen in this office in August 2025, at which time her blood pressure was elevated.  She was complaining of bouts of dizziness for years.  Today, 07/26/2024: She reports feeling stable, perhaps a little bit better even at times.  Nevertheless, she still has intermittent dizzy spells, takes meclizine  as needed and recently restarted gabapentin.  She is scheduled to have another lumbar nerve root ablation with neurosurgery, Dr. Darlis.  She has chronic low back pain.  She had been on gabapentin higher dose but does report it made her drowsy.  She still takes 200 mg in the mornings and admits to some daytime drowsiness.  She has never had a sleep study, she has some intermittent snoring and has nocturia at least twice per average night, denies recurrent nocturnal or morning headaches. She admits that she does not drink a lot of water , maybe 2 cups/day.  She drinks iced tea, at least 1 glass/day and 1 cup of coffee in the morning.  She drinks alcohol  occasionally, maybe up to twice a week, 1 drink usually, she is a non-smoker.  She and her husband are planning a trip to [place] in February 2026.  Recent cognitive  testing was not supportive of Alzheimer's dementia. She is currently on meclizine  as needed, she takes Flexeril  as needed, she is on Eliquis .  Full list of medications below. She had a brain MRI with and without contrast on 05/14/2024 and I reviewed the results:    IMPRESSION:    MRI brain (with and without) demonstrating: - Multiple round and ovoid periventricular, pericallosal, subcortical foci of nonspecific T2 hyperintensities.  Some of these are perpendicular to the corpus callosum.  No abnormal enhancement.  Considerations include autoimmune, inflammatory, demyelinating, post-infectious, or microvascular ischemic etiologies.  - No acute findings.  Compared to prior MRI from 11/06/2017, there has been progression of these white matter changes.    Recent laboratory test results were reviewed in her electronic chart.  Vitamin B12 in August 2025 was on the low end of normal at 339, folate 8.9, methylmalonic acid normal at 199, thiamine  normal at 119.3, BMP showed mildly reduced GFR at 58 mL/min.  TSH was normal at 3.45. She has undergone recent evaluation and testing through neuropsychology.  She saw Dr. Norleen Asa on 07/13/2024.  I reviewed the test results, impression and recommendations.  <<Overall Summary The the patient demonstrates average to high average intellectual abilities, with notable strengths in verbal reasoning, working memory, and verbal fluency. Memory performance is generally intact, particularly for auditory and immediate recall. Relative weaknesses include processing speed, fine motor dexterity, and executive functioning, particularly cognitive flexibility.  The pattern of cognitive strengths and weaknesses would be most consistent with mild changes typically seen with changes in  cerebral white matter tracts.   Impression/Diagnosis:                     Overall, the results of the current neuropsychological evaluation are generally encouraging regarding the pattern of  cognitive strengths and weaknesses, subjective reports and available medical information to show inconsistencies with progressive degenerative major neurocognitive conditions.  The pattern of cognitive strengths and weaknesses is not consistent with those typically see with conditions such as Alzheimer's, Lewy body etc.  The patient is generally maintaining her overall global cognitive capacity and the only clear mild weaknesses noted relative to premorbid estimates had to do with aspects of information processing speed, fine motor dexterity bilaterally and more complex integrated cognitive functioning's needed for time-based executive/reasoning and problem-solving types of challenges.  We have a very strong baseline of clinical information that would allow for repeat testing in the future if need be.  The overall pattern of cognitive strengths and weaknesses especially when put into context with available medical information and subjective descriptions by the patient and husband previously would be consistent with white matter involvement as the primary etiological factor.  While causative factor of the observed white matter changes noted in repeat MRI studies are still being investigated with potential microvascular/small vessel disease as a causative factor versus some type of potential autoimmune or inflammatory/demyelinating condition will need to be ongoing considerations for neurology for specific causative factors.  In any event, the patient again is not showing patterns consistent with progressive degenerative major neurocognitive disorder such as Alzheimer's etc. and observed data on the MRIs would be consistent with observed data on neurocognitive testing.   The patient does have a time to return for feedback with myself regarding the neuropsychological evaluation and it is currently scheduled on 09/12/2024 but we will work on trying to move that appointment up if possible.  In any event, the patient  should focus her efforts on maintaining these foundational types of aspects including diet, active physical movement and maintaining good sleep patterns wherever possible.  As the patient's referring neurologist is no longer being followed by Dr. Darleen as Dr. Ines is left the practice and the plan is that the patient be transferred over to Dr. Buck.  That coordination is still being worked out.   Diagnosis:                                Mild cognitive impairment of uncertain or unknown etiology   Memory loss >> Previously:  04/22/2024 (Dr. Ines): <<04/22/2024: 78 year old patient with have seen in the past for dizziness, here again for dizziness. has Vitamin D  deficiency; Dyslipidemia; Allergic rhinitis, cause unspecified; COUGH VARIANT ASTHMA; Diverticulosis of large intestine; DEGENERATIVE DISC DISEASE; Wrist osteonecrosis; PALPITATIONS; SKIN CANCER, HX OF; History of colonic polyps; NEPHROLITHIASIS, HX OF; Osteopenia; Back spasm; Abnormal CT scan, liver; Hepatic steatosis; Gallstones; Pancreatic cyst; Cryptogenic stroke (HCC); Bilateral sensorineural hearing loss; Imbalance; Chronic congestive heart failure (HCC); Right ureteral stone; Bilateral pulmonary embolism (HCC); Essential hypertension; Supraventricular tachycardia (HCC); and S/P total left hip arthroplasty on their problem list.   She has been having bouts of dizziness/chronic dizziness since at least 2019. The dizziness is still the same as before, when she woke up with morning she could feel in her head this sensation, she took meclizine . The room is not spinning, she feels her brain is spinning, it doesn't affect her walking, all going on in  her head, her husband provides mouch information, he tates her short term memory is not as good. He has noticed a difference in the last 6 months. She also has this complaint in 2022. She is nodding off during the day. She states she has had some health issues, she has had a hip and shoulder  replacement in the recent past. In the 6 month period, he feels it is worse, she mentioned this in 2022. In the last 4-5 months she asks the same question and repeats things, her short-term memory is not good, they see a change in the short term meory. She has fallen 4x in the last 4 months, she came in the kitchen and tripped over the threshold and fel, in the rough areas in the brick . She has recall difficulty.  She feels like she needs to shake her head to make it better. She has low back pain, she is gojng to have an ablation,scoliosis, not in her legs, she cannot describe the dizziness.  Meclizine .Patient complains of symptoms per HPI as well as the following symptoms: knee pain, chronic medical conditions . Pertinent negatives and positives per HPI. All others negative     Patient complains of symptoms per HPI as well as the following symptoms: memory loss, imbalance . Pertinent negatives and positives per HPI. All others negative   MRI brain 11/06/2017:FINDINGS: reviewed images and agree The brain parenchyma shows mild age-appropriate changes of chronic microvascular ischemia.  No structural lesion, tumor or infarcts are noted.  There are mild changes of chronic paranasal sinusitis.No abnormal lesions are seen on diffusion-weighted views to suggest acute ischemia. The cortical sulci, fissures and cisterns are normal in size and appearance. Lateral, third and fourth ventricle are normal in size and appearance. No extra-axial fluid collections are seen. No evidence of mass effect or midline shift.  No abnormal lesions are seen on post contrast views.  On sagittal views the posterior fossa, pituitary gland and corpus callosum are unremarkable. No evidence of intracranial hemorrhage on gradient-echo views. The orbits and their contents, paranasal sinuses and calvarium are unremarkable.  Intracranial flow voids are present.   IMPRESSION: Unremarkable MRI scan of the brain showing only mild age-appropriate  changes of chronic microvascular ischemia.  No acute abnormalities noted.   Patient complains of symptoms per HPI as well as the following symptoms: knee pain . Pertinent negatives and positives per HPI. All others negative       Interval history Jan 07, 2021: This is a patient is seen in the past for stroke, dizziness, today she is here for new chief complaint of memory loss, she had a pulmonary embolism and her stroke, she is on Eliquis . We discussed cryptogenic stroke. We discussed any relation between her DVT and cryptogenic embolic stroke, we discussed pfo, she has some trouble with recall but she is not too concerned, we discussed normal cognitive aging. She sat on the couch a lot during covid and she was having injections for lumbar issues, maybe sedentary lifestyle? She had just had an injection in her low back, but she walks a lot. She had shortness of breath with the pfo and she couldn't even walk one of the aquares.   Interval history 09/07/2018: She still has bouts of dizziness. Ativan doesn;t really help. Last one was before christmas for several weeks. Epley maneuvers don't help. She doesn't drink enough water . It can happen anytime. SHe feels dizzy, out of sorts, No perception of movement, she feels lightheaded, sitting down does  not make it better. She knows it is there, she can still get up and function, no falls. No problems with loop recorder. Her BP meds were adjusted and decreased and still having the episodes. ENT appointment in the past was normal. Her blood pressure is always low. Discussed vestibular migraines.    Interval history 10/26/2017: She has intermittent dizziness.  She went to the ED. It was late morning and waited for 8 hours. Need to repeat MRI brain. Vertigo was going on for a week, it was more dizziness, new symptoms. Given previous cryptogenic stroke and questionable white matter changes need repeat MRI brain     Interval history 04/22/2017: Patient is here with her  son for the first time. Spent an extended visit reviewing patient's clinical history, reviewing images of initial MRI and most recent MRI of the brain, reviewing echocardiogram and vascular imaging, discussing next and possibility of atrial fibrillation or other etiologies of clot, discussed stroke, treatment for atrial fibrillation and answered all questions.   Interval history 03/05/2017: Patient is here for follow-up of subacute strokes that were seen on MRI of the brain. She was started on aspirin 325 as well as hemoglobin A1c, she follows with PCP for cholesterol and advised LDL goal is less than 70. CTA of the head showed no intracranial or extracranial stenosis or occlusion of significance. Echocardiogram of the heart showed no PFO or thrombus.  Cardiac telemetry did not show atrial fibrillation.   HPI:  SHAREESE MACHA is a 78 y.o. female here as a referral from Dr. Onita for stroke. She has a history of palpitations. 10 years ago she had extensive palpitations. MRi results showed subacute strokes. She has a history of supraventricular tacycardia. No history of afib. PMHx OA, HLD, She was not on anti-platelet prior to dizziness. Dizziness since September, waxes and wanes, sometimes it is room spinning or imbalance. Currently she is fine but a week ago it was more imbalance when standing. She had a fall at Easter, she was rushing and she fell and hit her head on th left side. She bit her lip. Otherwise she can;t remember any event that may have happened in the last several months coinciding with the MRI results. She tends to walk and catches her toe and has falls. She has had vestibular therapy but never for gait abnormality or falls. No other focal neurologic deficits, associated symptoms, inciting events or modifiable factors.   Reviewed notes, labs and imaging from outside physicians, which showed:   Reviewed ENT notes. She presented with a chief complaint of dizziness and hearing loss. This started  last fall when she started feeling imbalance and standing. There has been some room spinning. Onset was fairly sudden. PCP prescribed meclizine . The feeling comes and goes and goes away for a few weeks and comes back for a few weeks. Meclizine  is helpful. The feeling is constant even when not moving. Head movements do not affect the feeling. She has white noise all the time that worsens. There is an associated pressure feeling in the head. When she stands the imbalance worsens. She has trouble hearing high-pitched sounds. She work with a physical therapist who did not find a problem. She has a remote history of migraines. Exam showed normal on exam, hearing test demonstrates symmetric sensorineural loss. Her imbalance symptoms not typical of an inner ear process. He recommended brain imaging.   Personally reviewed MRI images and agree with the following:   IMPRESSION: Four foci of low level restricted  diffusion and contrast enhancement in the deep white matter of the left hemisphere probably consistent with late subacute small vessel infarctions. These could be embolic or watershed. No evidence of cortical infarction. The differential diagnosis does include demyelinating disease, but that is felt less likely.   Mild chronic small-vessel ischemic change of the hemispheric white matter in addition.   LDL 96 in 2016   >>  Her Past Medical History Is Significant For: Past Medical History:  Diagnosis Date   Allergy    Bilateral sensorineural hearing loss 11/06/2016   Cancer (HCC)    skin cancer - on back, forehead   Chronic congestive heart failure (HCC) 02/22/2018   Chronic low back pain    Cryptogenic stroke (HCC)    followed by Dr Ines   Diverticulosis of colon 2007   Dr Obie   Dyslipidemia    Dysrhythmia    Gallstones 01/20/2014   12/2013 multiple gallstones, largest 1.7 cm in diameter; asymptomatic    History of basal cell carcinoma excision    X3   History of colon polyps     NON-CANCEROUS   History of kidney stones    Hyperlipidemia    Lower extremity edema    Nephrolithiasis    Nocturia    OA (osteoarthritis of spine)    Osteopenia    Palpitations 11/24/2007   Qualifier: Diagnosis of  By: Tish MD, William     Palpitations    Pancreatic cyst 01/20/2014   01/03/14 8 x 11 mm nonaggressive cystic lesion the pancreas. Followup recommended in one year.    PONV (postoperative nausea and vomiting)    PSVT (paroxysmal supraventricular tachycardia)    adenosine sensitive SVT previously documented   Pulmonary embolism (HCC)    Right ureteral stone    Scoliosis    Urgency of urination    Varicose vein of leg    Venous stasis    Vitamin D  deficiency    Wears glasses     Her Past Surgical History Is Significant For: Past Surgical History:  Procedure Laterality Date   APPENDECTOMY  1950   CARPECTOMY WITH RADIAL STYLOIDECTOMY Right 09/12/2010   AND NEURECTOMY  OF WRIST (STAGE III [place] DISEASE)   CLOSED MANIPULATION POST TOTAL  KNEE ARTHROPLASTY  07/13/2006   COLONOSCOPY W/ POLYPECTOMY  2002   adenomatous; Dr Obie   epidural steroid injections     two rounds of injections; lumbar spine    EXTRACORPOREAL SHOCK WAVE LITHOTRIPSY  X3   FACELIFT W/BLEPHAROPLASTY  2005   HYSTEROSCOPY WITH D & C  12/22/2000   implantable loop recorder removal  10/29/2020   MDT Reveal LINQ removed by Dr Kelsie   LOOP RECORDER INSERTION N/A 05/21/2017   Procedure: LOOP RECORDER INSERTION;  Surgeon: Kelsie Agent, MD;  Location: [facility];  Service: Cardiovascular;  Laterality: N/A;   OPEN KNEE MENISECTOMY  1968   REVERSE SHOULDER ARTHROPLASTY Left 10/01/2023   Procedure: REVERSE SHOULDER ARTHROPLASTY;  Surgeon: Melita Drivers, MD;  Location: [facility];  Service: Orthopedics;  Laterality: Left;    RIGHT URETEROSCOPIC STONE EXTRACTION / STENT PLACEMENT  08/21/2008   right wrist surgery     rotator cuff on right shoulder     TONSILLECTOMY  1950   TOTAL HIP  ARTHROPLASTY Left 12/02/2022   Procedure: TOTAL HIP ARTHROPLASTY ANTERIOR APPROACH;  Surgeon: Ernie Cough, MD;  Location: [facility];  Service: Orthopedics;  Laterality: Left;   TOTAL KNEE ARTHROPLASTY Right 05/30/2006   TUBAL LIGATION  WISDOM TOOTH EXTRACTION      Her Family History Is Significant For: Family History  Problem Relation Age of Onset   Heart attack Mother 42   Emphysema Father        smoker   Colon polyps Father    Breast cancer Sister        late 80s   Cancer Paternal Grandfather        throat cancer   Esophageal cancer Paternal Grandfather    Heart attack Maternal Grandfather 34   Diabetes Neg Hx    Stroke Neg Hx    Colon cancer Neg Hx    Rectal cancer Neg Hx    Stomach cancer Neg Hx     Her Social History Is Significant For: Social History   Socioeconomic History   Marital status: Married    Spouse name: Not on file   Number of children: 2   Years of education: Not on file   Highest education level: Bachelor's degree (e.g., BA, AB, BS)  Occupational History   Not on file  Tobacco Use   Smoking status: Never   Smokeless tobacco: Never  Vaping Use   Vaping status: Never Used  Substance and Sexual Activity   Alcohol  use: Not Currently    Comment: occ   Drug use: No   Sexual activity: Not on file  Other Topics Concern   Not on file  Social History Narrative   Lives at home in [place] with spouse.    Retired Optometrist.   Active in the community.   Right handed   Drinks 1 cup of caffeine daily   Social Drivers of Health   Financial Resource Strain: Not on file  Food Insecurity: No Food Insecurity (12/02/2022)   Hunger Vital Sign    Worried About Running Out of Food in the Last Year: Never true    Ran Out of Food in the Last Year: Never true  Transportation Needs: No Transportation Needs (12/02/2022)   PRAPARE - Administrator, Civil Service (Medical): No    Lack of Transportation (Non-Medical): No  Physical Activity: Not on  file  Stress: Not on file  Social Connections: Not on file    Her Allergies Are:  Allergies  Allergen Reactions   Morphine Other (See Comments)    Respiratory compromise post op   Penicillamine Itching   Penicillins Hives    Tolerates Ancef  12/02/22   Levofloxacin Other (See Comments)     SHOULDER PAIN  :   Her Current Medications Are:  Outpatient Encounter Medications as of 07/26/2024  Medication Sig   acetaminophen  (TYLENOL ) 650 MG CR tablet Take 1,300 mg by mouth every 8 (eight) hours as needed for pain.   apixaban  (ELIQUIS ) 5 MG TABS tablet Take 5 mg by mouth in the morning and at bedtime.   Brimonidine Tartrate (LUMIFY) 0.025 % SOLN Place 1 drop into both eyes 2 (two) times daily.   cetirizine (ZYRTEC) 10 MG tablet Take 10 mg by mouth daily as needed for allergies.   cyclobenzaprine  (FLEXERIL ) 10 MG tablet Take 1 tablet (10 mg total) by mouth 3 (three) times daily as needed for muscle spasms.   fluticasone  (FLONASE ) 50 MCG/ACT nasal spray Place 1 spray into both nostrils daily as needed for allergies.   gabapentin (NEURONTIN) 100 MG capsule Take 100 mg by mouth 3 (three) times daily.   meclizine  (ANTIVERT ) 25 MG tablet Take 25 mg by mouth daily as needed.   ondansetron  (ZOFRAN ) 4  MG tablet Take 1 tablet (4 mg total) by mouth every 8 (eight) hours as needed for nausea or vomiting.   pravastatin  (PRAVACHOL ) 40 MG tablet Take 40 mg by mouth daily.   No facility-administered encounter medications on file as of 07/26/2024.  :   Review of Systems:  Out of a complete 14 point review of systems, all are reviewed and negative with the exception of these symptoms as listed below:  Review of Systems  Objective:  Neurological Exam  Physical Exam Physical Examination:   Vitals:   07/26/24 1006  BP: (!) 145/84  Pulse: 77    General Examination: The patient is a very pleasant 78 y.o. female in no acute distress. She appears well-developed and well-nourished and well groomed.    HEENT: Normocephalic, atraumatic, pupils are equal, round and reactive to light, extraocular tracking is good without limitation to gaze excursion or nystagmus noted. No photophobia.  Corrective eye glasses in place. Hearing is grossly intact.  Face is symmetric with normal facial animation. Speech is clear without dysarthria. There is no hypophonia. There is no lip, neck/head, jaw or voice tremor. Neck is supple with full range of passive and active motion. There are no carotid bruits on auscultation.  Airway/Oropharynx exam reveals: moderate mouth dryness, adequate dental hygiene and mild airway crowding, due to small airway entry and redundant soft palate.  Tonsils absent.  Tongue protrudes slightly deviated to the right, not sure if this is new.  Could be a sequela of her prior stroke, she is not sure.   Chest: Clear to auscultation without wheezing, rhonchi or crackles noted.  Heart: S1+S2+0, regular and normal without murmurs, rubs or gallops noted.   Abdomen: Soft, non-tender and non-distended.  Extremities: There is no pitting edema in the distal lower extremities bilaterally.  Chronic appearing discoloration in the right distal lower extremity.  Skin: Warm and dry without trophic changes noted.   Musculoskeletal: exam reveals no obvious joint deformities.  She is status post left total hip replacement.  Neurologically:  Mental status: The patient is awake, alert and oriented in all 4 spheres. Her immediate and remote memory, attention, language skills and fund of knowledge are fairly appropriate. There is no evidence of aphasia, agnosia, apraxia or anomia. Speech is clear with normal prosody and enunciation. Thought process is linear. Mood is normal and affect is normal.  Cranial nerves II - XII are as described above under HEENT exam.  Motor exam: Normal bulk, strength and tone is noted. There is no obvious action or resting tremor.  Fine motor skills and coordination: Intact to  mildly impaired globally, no lateralization noted.  Reflexes are 1+ throughout including ankles, toes are downgoing bilaterally. Cerebellar testing: No dysmetria or intention tremor. There is no truncal or gait ataxia.  Sensory exam: intact to light touch in the upper and lower extremities.  Gait, station and balance: She stands without difficulty and posture is age-appropriate.  She walks without a walking aid, slightly cautiously.  Assessment and Plan:  In summary, JOELLE ROSWELL is a very pleasant 78 y.o.-year old female with an underlying complex medical history of stroke, hyperlipidemia, palpitations, PSVT, congestive heart failure, hearing loss, vitamin D  deficiency, pancreatic cyst, allergy, low back pain, diverticulosis, kidney stones, lower extremity edema, history of PE, and mildly overweight state, who presents for follow-up consultation of her cognitive complaints and memory loss.  She has had extensive workup so far.  She has multiple vascular risk factors and is at risk for vascular  dementia.  Recent neuropsychological evaluation does not favor changes in keeping with Alzheimer's dementia.  I had a long discussion with the patient today.  We talked about secondary vascular prevention, lifestyle modification, and further workup.  I do not believe that we need to start her on any new medication for mind of things.  Given that she has had dizziness and even falls, we talked about fall risk and medications and situations that can exacerbate dizziness including her current medications such as meclizine  and gabapentin.  She is currently not taking Flexeril  on a regular basis.  Even that can cause dizziness and sleepiness.  At this juncture, she is advised to proceed with a sleep study.  We will call her with the her results and if she has obstructive sleep apnea, we will consider treatment with an AutoPap machine.  We talked about the links between obstructive sleep apnea and cardiovascular disease and  neurovascular disease.  She is advised to keep her follow-up appointment with her current specialist and PCP and follow-up routinely in this clinic in about 8 months to see one of our nurse practitioners for memory recheck.  She is encouraged to increase her water  intake.  We will call her with her sleep study results in the interim and consider treatment and follow-up accordingly.  I answered all her questions today and she was in agreement with our approach. I spent 45 minutes in total face-to-face time and in reviewing records during pre-charting, more than 50% of which was spent in counseling and coordination of care, reviewing test results, reviewing medications and treatment regimen and/or in discussing or reviewing the diagnosis of cognitive complaints, the prognosis and treatment options. Pertinent laboratory and imaging test results that were available during this visit with the patient were reviewed by me and considered in my medical decision making (see chart for details).

## 2024-07-28 ENCOUNTER — Encounter: Attending: Psychology | Admitting: Psychology

## 2024-07-28 ENCOUNTER — Encounter: Payer: Self-pay | Admitting: Psychology

## 2024-07-28 DIAGNOSIS — G3184 Mild cognitive impairment, so stated: Secondary | ICD-10-CM | POA: Insufficient documentation

## 2024-07-28 DIAGNOSIS — R413 Other amnesia: Secondary | ICD-10-CM | POA: Insufficient documentation

## 2024-07-28 NOTE — Progress Notes (Signed)
 Neuropsychological Evaluation   Patient:  Sarah Valdez   DOB: [birth year]  MR Number: [id number]  Location: [facility] [place] [facility] [address] [place], [address] [place] [address] Dept: [phone]  Start: 11 AM End: 12 PM  Provider/Observer:     Norleen JONELLE Asa PsyD  Chief Complaint:      Chief Complaint  Patient presents with   Memory Loss   Dizziness   Fall   Back Pain   07/28/2024 1 PM-2 PM: Today I provided feedback regarding the results of the recent neuropsychological evaluation.  I have included the reason for service and the summary of the evaluation below for convenience and the complete neuropsychological evaluation can be found in her EMR dated 07/13/2024.  Today we reviewed the results and how the diagnostic considerations were put together and interpreted as well as spent time talking about recommendations that were noted in the report and went into greater detail regarding specific recommendations for the patient to engage in her day-to-day life.  While we have not scheduled for repeat neuropsychological testing, as at this point it does not appear to be needed, I am available if the patient notices any progressive decline over the next year and we have a very good baseline for any potential repeat testing if need be.  Reason For Service:     Sarah Valdez is a 78 year old female referred for neuropsychological evaluation by her neurologist, Dr. Ines (now being followed by Dr. Buck), due to ongoing and progressing memory difficulties and word-finding changes. The purpose of this evaluation is to establish a cognitive baseline. She has been followed in neurology for dizziness/vertigo and a history of cryptogenic stroke. Past medical history includes vitamin D  deficiency, dyslipidemia, allergic rhinitis, cough variant asthma, diverticulitis, degenerative disc disease, wrist  osteonecrosis, skin cancer, colonic polyps, asthenia, back spasms, hepatic steatosis, gallstones, and bilateral hearing loss. Also a history of chronic congestive heart failure, right ureteral stone, bilateral pulmonary embolism, essential hypertension, and supraventricular tachycardia. She is status post total left hip arthroplasty, shoulder surgery, and knee replacement. The patient's mother-in-law had a 10-year decline with presumed Alzheimer's disease, which is a source of concern for her husband. No other family history of similar conditions is reported. Review of previous medical records indicates the patient's husband reported significant short-term memory issues with progression over the past six months, including repetitive questioning. The patient notes memory changes as far back as 2022. Several falls have occurred in the past four to five months. Dizziness has been present since at least 2019. Brain MRI on 11/06/2017 showed mild age-appropriate chronic microvascular ischemic changes. An MRI from 2018 found findings concerning for a possible demyelinating condition. A more recent MRI on 05/14/2024 noted progression of multiple nonspecific T2 hyperintensities, with considerations including autoimmune, inflammatory, demyelinating, post-infectious, or microvascular ischemic etiologies. Laboratory tests for B12 and folate were within normal limits. Previous cognitive screeners (MMSE, MoCA) by Dr. Ines were notable for lost credits on delayed recall. Current medications include Gabapentin for pain.  The patient reports memory has improved slightly. She denies any tremors, significant sleep disturbance, hallucinations, or delusions. She acknowledges a significant history of back pain, which was previously alleviated with nerve ablation. She is scheduled for another ablation in February. She reports pain interferes with memory.  The patient describes good sleep and denies difficulty initiating or  maintaining sleep. She reports taking a nap daily, though her husband has reported to other  providers that she frequently nods off or falls asleep during the day.     Impression/Diagnosis:   Overall, the results of the current neuropsychological evaluation are generally encouraging regarding the pattern of cognitive strengths and weaknesses, subjective reports and available medical information to show inconsistencies with progressive degenerative major neurocognitive conditions.  The pattern of cognitive strengths and weaknesses is not consistent with those typically see with conditions such as Alzheimer's, Lewy body etc.  The patient is generally maintaining her overall global cognitive capacity and the only clear mild weaknesses noted relative to premorbid estimates had to do with aspects of information processing speed, fine motor dexterity bilaterally and more complex integrated cognitive functioning's needed for time-based executive/reasoning and problem-solving types of challenges.  We have a very strong baseline of clinical information that would allow for repeat testing in the future if need be.  The overall pattern of cognitive strengths and weaknesses especially when put into context with available medical information and subjective descriptions by the patient and husband previously would be consistent with white matter involvement as the primary etiological factor.  While causative factor of the observed white matter changes noted in repeat MRI studies are still being investigated with potential microvascular/small vessel disease as a causative factor versus some type of potential autoimmune or inflammatory/demyelinating condition will need to be ongoing considerations for neurology for specific causative factors.  In any event, the patient again is not showing patterns consistent with progressive degenerative major neurocognitive disorder such as Alzheimer's etc. and observed data on the MRIs would  be consistent with observed data on neurocognitive testing.  The patient does have a time to return for feedback with myself regarding the neuropsychological evaluation and it is currently scheduled on 09/12/2024 but we will work on trying to move that appointment up if possible.  In any event, the patient should focus her efforts on maintaining these foundational types of aspects including diet, active physical movement and maintaining good sleep patterns wherever possible.  As the patient's referring neurologist is no longer being followed by Dr. Darleen as Dr. Ines is left the practice and the plan is that the patient be transferred over to Dr. Buck.  That coordination is still being worked out.  Diagnosis:    Mild cognitive impairment of uncertain or unknown etiology  Memory loss   _____________________ Norleen Asa, Psy.D. Clinical Neuropsychologist

## 2024-09-06 ENCOUNTER — Ambulatory Visit: Admitting: Neurology

## 2024-09-07 ENCOUNTER — Ambulatory Visit: Admitting: Neurology

## 2024-09-12 ENCOUNTER — Ambulatory Visit: Admitting: Psychology

## 2024-09-13 ENCOUNTER — Ambulatory Visit: Admitting: Cardiology

## 2024-09-13 ENCOUNTER — Encounter: Payer: Self-pay | Admitting: Cardiology

## 2024-09-13 VITALS — BP 118/78 | HR 94 | Ht 67.0 in | Wt 177.8 lb

## 2024-09-13 DIAGNOSIS — I639 Cerebral infarction, unspecified: Secondary | ICD-10-CM | POA: Diagnosis not present

## 2024-09-13 DIAGNOSIS — I471 Supraventricular tachycardia, unspecified: Secondary | ICD-10-CM | POA: Diagnosis not present

## 2024-09-13 NOTE — Patient Instructions (Signed)

## 2024-09-13 NOTE — Progress Notes (Signed)
" °  Electrophysiology Office Note:   Date:  09/13/2024  ID:  Sarah Valdez, DOB 11/12/1945, MRN [id number]  Primary Cardiologist: None Electrophysiologist: Fonda Kitty, MD      History of Present Illness:    Discussed the use of AI scribe software for clinical note transcription with the patient, who gave verbal consent to proceed.  History of Present Illness Sarah Valdez is an 79 year old female who presents for a follow-up visit after loop recorder removal. She was referred by Dr. Ines for cardiac evaluation following a stroke.  She reports that she has not experienced any episodes of heart palpitations, pounding, racing, skipping, or fluttering in a long time. She is not currently on any medications related to her cardiac condition.  Her cardiac history includes the placement of a loop recorder following a stroke. The loop recorder was removed after it did not show any significant findings, and she has not had any cardiac episodes since its removal.  Her initial cardiac evaluation was prompted by a stroke, after which she was referred for further evaluation.   Review of systems complete and found to be negative unless listed in HPI.   EP Information / Studies Reviewed:    EKG is not ordered today. EKG from 04/29/24 reviewed which showed SR with PR QRS 80ms.      Echo 02/07/21:  1. Left ventricular ejection fraction, by estimation, is 55 to 60%. The  left ventricle has normal function. The left ventricle has no regional  wall motion abnormalities. Left ventricular diastolic parameters are  consistent with Grade I diastolic  dysfunction (impaired relaxation). The average left ventricular global  longitudinal strain is -17.5 %. The global longitudinal strain is normal.   2. Right ventricular systolic function is normal. The right ventricular  size is normal. Tricuspid regurgitation signal is inadequate for assessing  PA pressure.   3. The mitral valve is normal in  structure. No evidence of mitral valve  regurgitation. No evidence of mitral stenosis.   4. The aortic valve is tricuspid. Aortic valve regurgitation is not  visualized. No aortic stenosis is present.   5. The inferior vena cava is normal in size with greater than 50%  respiratory variability, suggesting right atrial pressure of 3 mmHg.       Physical Exam:   VS:  BP 118/78   Pulse 94   Ht 5' 7 (1.702 m)   Wt 177 lb 12.8 oz (80.6 kg)   SpO2 96%   BMI 27.85 kg/m    Wt Readings from Last 3 Encounters:  09/13/24 177 lb 12.8 oz (80.6 kg)  07/26/24 174 lb 6.4 oz (79.1 kg)  04/29/24 164 lb 12.8 oz (74.8 kg)     General: Well developed, in no acute distress.  Neck: No JVD.  Cardiac: Normal rate, regular rhythm.  Resp: Normal work of breathing.  Ext: No edema.  Neuro: No gross focal deficits.  Psych: Normal affect.    ASSESSMENT AND PLAN:    #SVT: No episodes in years. By history adenosine sensitive. Now off diltiazem  secondary to edema and doing well. - Continue to monitor.   #H/o stroke: Had ILR placed. Reportedly had no arrhythmias and was eventually removed.   Follow up with EP Team as needed.   Signed, Fonda Kitty, MD  "

## 2024-09-14 ENCOUNTER — Encounter

## 2025-05-02 ENCOUNTER — Ambulatory Visit: Admitting: Adult Health

## 2026-04-26 ENCOUNTER — Ambulatory Visit: Admitting: Adult Health
